# Patient Record
Sex: Male | Born: 1985
Health system: Southern US, Community
[De-identification: ages and names within clinical notes are randomized; demographics above are authoritative.]

## PROBLEM LIST (undated history)

## (undated) ENCOUNTER — Inpatient Hospital Stay: Payer: Self-pay | Admitting: Podiatry

## (undated) DIAGNOSIS — Z789 Other specified health status: Secondary | ICD-10-CM

## (undated) HISTORY — PX: NO PAST SURGERIES: SHX2092

---

## 2008-02-16 ENCOUNTER — Observation Stay (HOSPITAL_COMMUNITY): Admission: EM | Admit: 2008-02-16 | Discharge: 2008-02-17 | Payer: Self-pay | Admitting: Emergency Medicine

## 2008-02-16 IMAGING — CR DG NECK SOFT TISSUE
1 series · 1 of 1 positions shown · non-contrast
Comparison: None

CLINICAL DATA: Sore throat

NECK SOFT TISSUES - 3+ VIEW

[w soft tissue neck]
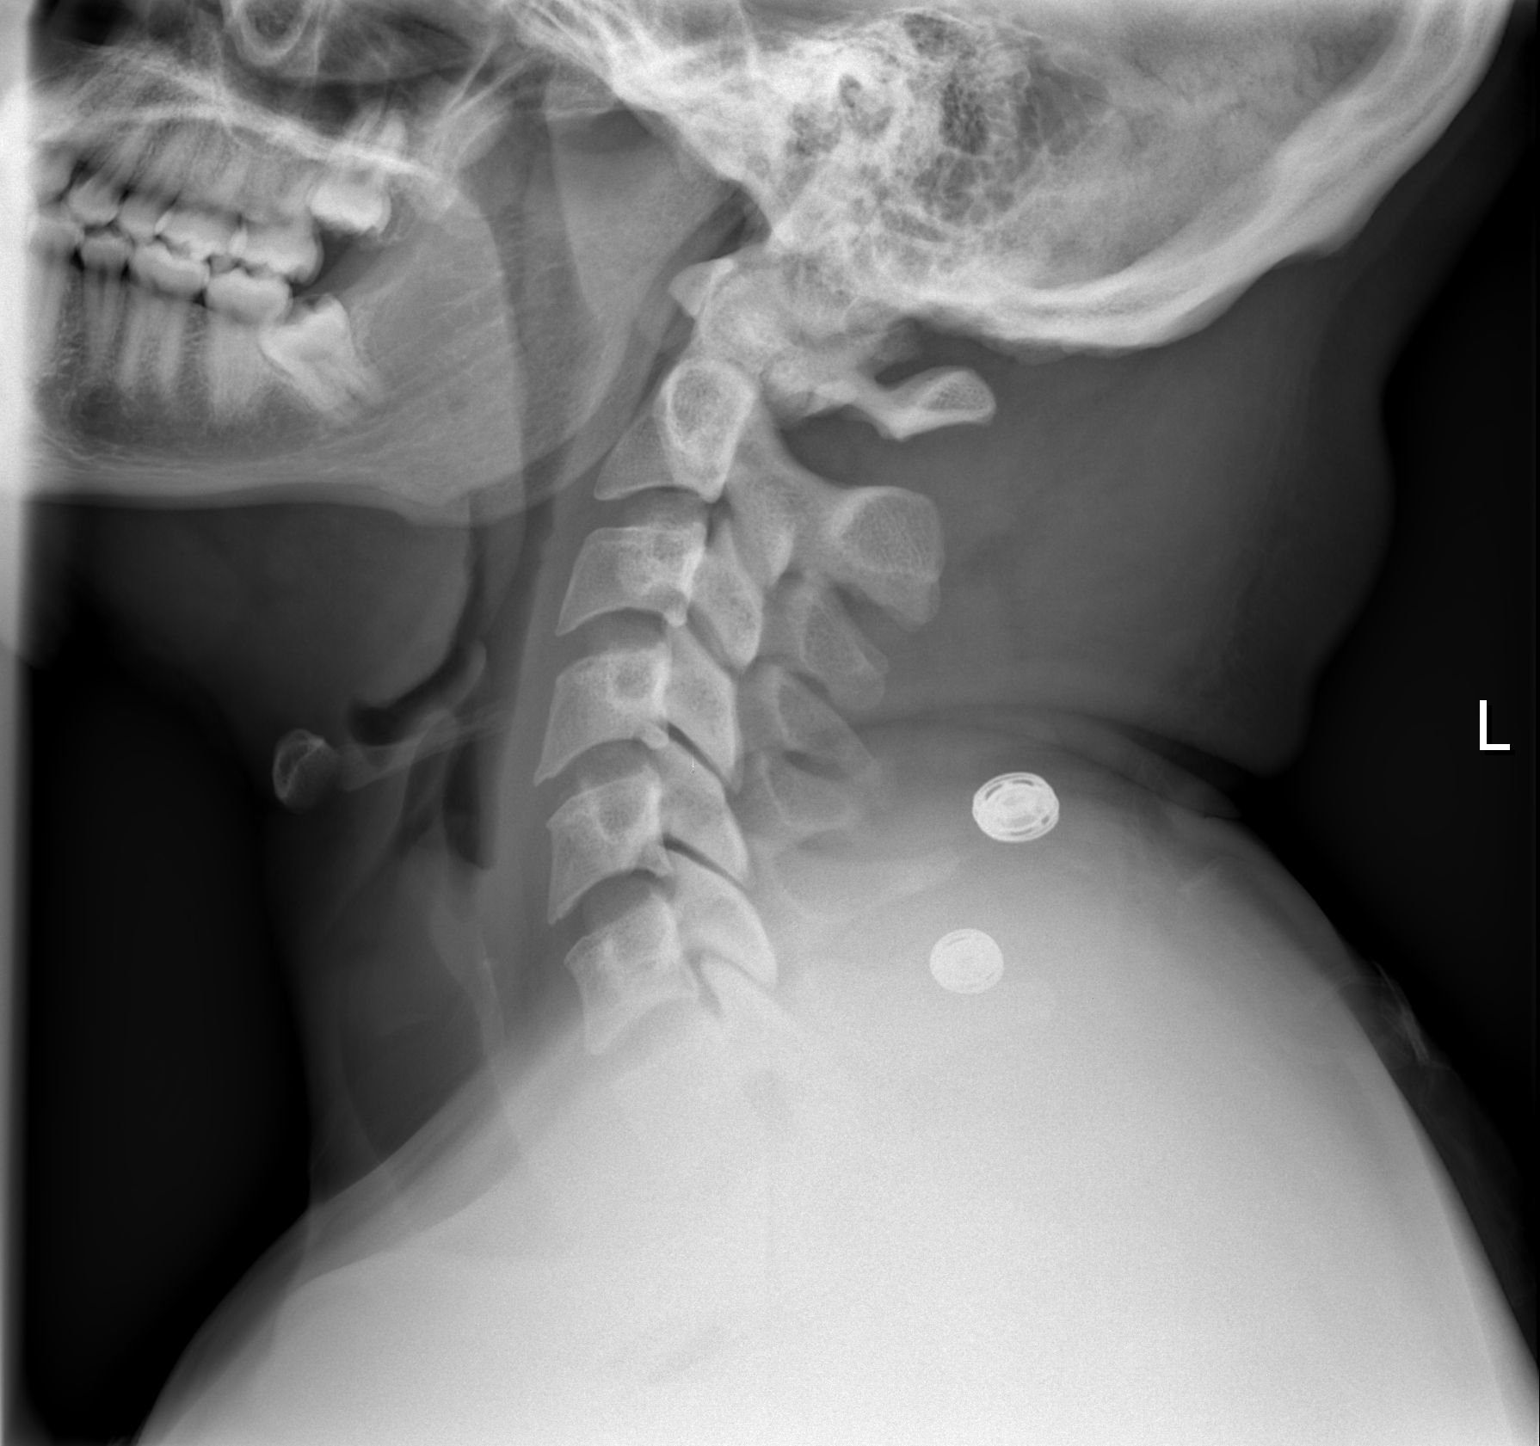

[1 of 1 positions shown; findings below may reference images not displayed]

FINDINGS: No radiopaque foreign body.  The airway is patent.
Epiglottis is normal.  No visualized osseous abnormality.
IMPRESSION: Normal soft tissue neck radiograph.

## 2010-09-06 NOTE — H&P (Signed)
NAMEPARV, Mark Stein               ACCOUNT NO.:  [id number]   MEDICAL RECORD NO.:  [id number]          PATIENT TYPE:  INP   LOCATION:  5528                         FACILITY:  [facility]   PHYSICIAN:  Vania Rea, M.D. DATE OF BIRTH:  1985-07-26   DATE OF ADMISSION:  02/16/2008  DATE OF DISCHARGE:                              HISTORY & PHYSICAL   PRIMARY CARE PHYSICIAN:  Unassigned.   CHIEF COMPLAINT:  Swelling of the throat, difficulty breathing.   HISTORY OF PRESENT ILLNESS:  This is a 25 year old obese African  American gentleman who has no past medical history, is on no medications  and has no medical followup who after a weekend of partying and drinking  went to sleep between around midnight and 1:00 a.m. this morning and  awoke suddenly at 4:00 this morning vomiting, felt his throat was  swollen inside and had difficulty breathing.  He came to the emergency  room where he received intravenous Benadryl and dexamethasone and said  his breathing is a little better now.  He has had no previous episode of  a similar problem, has no history of allergies, asthma nor sinusitis.  He does have a brother who had asthma in early childhood.  The patient  says the only unusual thing he has eaten or drank recently is that he  had some food at [facility] which is the first time he has eaten at [employer].   PAST MEDICAL HISTORY:  Nil.   MEDICATIONS:  None.   ALLERGIES:  No known drug allergies.   SOCIAL HISTORY:  Smokes one Black and Mild about every 2 weeks, drinks a  six pack of beer about every other weekend, smokes marijuana once about  every few months, denies cocaine use, works as a Conservation officer, nature at [employer].   FAMILY HISTORY:  Does not know of any family medical problems.   REVIEW OF SYSTEMS:  Other than noted above a 10 point review of systems  is completely normal.   PHYSICAL EXAMINATION:  GENERAL:  An obese young African American  gentleman lying on the stretcher in no acute distress.  VITAL SIGNS:  Temperature 97.1, pulse 97, respirations 20, blood  pressure 144/94.  He is saturating at 99% on room air.  He describes the  pain level as 8/10.  HEENT:  His pupils are round and equal.  Mucous membranes pink and  anicteric.  He is not dehydrated.  No cervical lymphadenopathy.  No  thyromegaly.  No jugular venous distention.  No oral lesions.  His uvula  is enlarged and edematous and he has some edema in the periuvula area.  He appears to be status post tonsillectomy.  CHEST:  Clear to auscultation bilaterally.  CARDIOVASCULAR:  Regular rhythm without murmurs.  ABDOMEN:  Obese, soft and nontender.  EXTREMITIES:  Without edema.  CENTRAL NERVOUS SYSTEM:  Cranial nerves II-XII are grossly intact and he  has no focal neurologic deficit.   LABS:  His CBC is unremarkable with a white count of 9.0 and a normal  differential.  His serum chemistry is normal.  His glucose is 102.  Chest x-ray  of his neck shows normal soft tissue radiograph.   ASSESSMENT:  Acute allergic reaction manifested as pharyngeal edema.   PLAN:  Will bring this gentleman in for observation for continued H1 and  H2 blockers, anti-inflammatory steroids and symptomatic treatment.  Can  be discharged home when the patient is considered no longer at risk for  respiratory compromise.  We understand ENT surgeons have been consulted  and they will be seeing the patient in hospital.  Other plans as per  orders.      Vania Rea, M.D.  Electronically Signed     LC/MEDQ  D:  02/16/2008  T:  02/16/2008  Job:  [id number]

## 2011-01-24 LAB — BASIC METABOLIC PANEL
BUN: 14
CO2: 26
Chloride: 103
Creatinine, Ser: 0.92
GFR calc non Af Amer: >60
Potassium: 4.6
Sodium: 138

## 2011-01-24 LAB — CBC
HCT: 42.8
Hemoglobin: 14.6
MCHC: 33.1
MCV: 86.8
Platelets: 280
RDW: 13.2
WBC: 20.1 — ABNORMAL HIGH

## 2011-01-24 LAB — POCT I-STAT, CHEM 8
BUN: 17
Calcium, Ion: 1.06 — ABNORMAL LOW
Chloride: 106
Potassium: 3.8

## 2011-01-24 LAB — DIFFERENTIAL
Basophils Relative: 1
Eosinophils Relative: 3
Lymphocytes Relative: 26
Lymphs Abs: 2.4
Monocytes Relative: 6
Neutro Abs: 5.8
Neutrophils Relative %: 64

## 2011-01-24 LAB — RAPID STREP SCREEN (MED CTR MEBANE ONLY): Streptococcus, Group A Screen (Direct): NEGATIVE

## 2015-12-15 ENCOUNTER — Ambulatory Visit (INDEPENDENT_AMBULATORY_CARE_PROVIDER_SITE_OTHER): Payer: Self-pay | Admitting: Physician Assistant

## 2015-12-15 VITALS — BP 158/90 | HR 92 | Temp 98.2°F | Resp 16 | Ht 72.0 in | Wt 253.0 lb

## 2015-12-15 DIAGNOSIS — L97521 Non-pressure chronic ulcer of other part of left foot limited to breakdown of skin: Secondary | ICD-10-CM

## 2015-12-15 LAB — POCT GLYCOSYLATED HEMOGLOBIN (HGB A1C): Hemoglobin A1C: 4.8

## 2015-12-15 LAB — GLUCOSE, POCT (MANUAL RESULT ENTRY): POC Glucose: 106 mg/dl — AB (ref 70–99)

## 2015-12-15 MED ORDER — CEPHALEXIN 500 MG PO CAPS: 500.0000 mg | ORAL_CAPSULE | Freq: Three times a day (TID) | ORAL | 0 refills | Status: DC

## 2015-12-15 NOTE — Patient Instructions (Addendum)
Wash you toe every 12 hours with soap and water, apply neosporin and reapply bandage.  If you are not improving it is okay to start taking antibiotic, however I don't think you need this today.     IF you received an x-ray today, you will receive an invoice from [facility]. Please contact [facility] at [phone] with questions or concerns regarding your invoice.   IF you received labwork today, you will receive an invoice from [facility]. Please contact [facility] at [phone] with questions or concerns regarding your invoice.   Our billing staff will not be able to assist you with questions regarding bills from these companies.  You will be contacted with the lab results as soon as they are available. The fastest way to get your results is to activate your My Chart account. Instructions are located on the last page of this paperwork. If you have not heard from us regarding the results in 2 weeks, please contact this office.

## 2015-12-15 NOTE — Progress Notes (Addendum)
   12/15/2015 10:40 AM   DOB: January 07, 1986 / MRN: [id number]  SUBJECTIVE:  Mark Stein is a 30 y.o. male presenting for a left great toe wound that started 3 days ago after walking barefoot.  He reports to me that the wound is painless.  He does not know if he is a diabetic as he is not receiving screenings.  He denies sock and glove paresthesia.  He urinates one to two times nightly and this has not changed.  Denies vision changes.    He has no allergies on file.   He  has no past medical history on file.    He  reports that he has quit smoking. He has never used smokeless tobacco. He  has no sexual activity history on file. The patient  has no past surgical history on file.  His family history is not on file.  Review of Systems  Constitutional: Negative for chills and fever.  Cardiovascular: Negative for chest pain.  Gastrointestinal: Negative for nausea.  Musculoskeletal: Negative for myalgias.  Skin: Positive for rash.  Neurological: Negative for dizziness and headaches.    The problem list and medications were reviewed and updated by myself where necessary and exist elsewhere in the encounter.   OBJECTIVE:  BP (!) 158/90 (BP Location: Right Arm, Patient Position: Sitting, Cuff Size: Large)   Pulse 92   Temp 98.2 F (36.8 C) (Oral)   Resp 16   Ht 6' (1.829 m)   Wt 253 lb (114.8 kg)   SpO2 99%   BMI 34.31 kg/m   Physical Exam  Cardiovascular: Normal rate and regular rhythm.   BP 138/84 on recheck by me  Pulmonary/Chest: Effort normal and breath sounds normal.  Musculoskeletal:       Feet:  Skin: Skin is warm and dry.    Results for orders placed or performed in visit on 12/15/15 (from the past 72 hour(s))  POCT glucose (manual entry)     Status: Abnormal   Collection Time: 12/15/15 10:22 AM  Result Value Ref Range   POC Glucose 106 (A) 70 - 99 mg/dl  POCT glycosylated hemoglobin (Hb A1C)     Status: None   Collection Time: 12/15/15 10:26 AM  Result Value Ref  Range   Hemoglobin A1C 4.8     No results found.  ASSESSMENT AND PLAN  Mark Stein was seen today for foot injury.  Diagnoses and all orders for this visit:  Toe ulcer, left, limited to breakdown of skin [facility](HCC): He is not diabetic.  Will try to treat this wound topically for now.  He has a printed prescription for keflex tid if he is not improving.   -     POCT glycosylated hemoglobin (Hb A1C) -     POCT glucose (manual entry)    The patient is advised to call or return to clinic if he does not see an improvement in symptoms, or to seek the care of the closest emergency department if he worsens with the above plan.   Deliah BostonMichael Clark, MHS, PA-C [facility] 12/15/2015 10:40 AM

## 2017-04-20 ENCOUNTER — Emergency Department (HOSPITAL_COMMUNITY): Payer: Managed Care, Other (non HMO)

## 2017-04-20 ENCOUNTER — Encounter (HOSPITAL_COMMUNITY): Payer: Self-pay | Admitting: Emergency Medicine

## 2017-04-20 ENCOUNTER — Emergency Department (HOSPITAL_COMMUNITY)
Admission: EM | Admit: 2017-04-20 | Discharge: 2017-04-20 | Disposition: A | Discharge status: Home / Self Care | Payer: Managed Care, Other (non HMO) | Attending: Emergency Medicine | Admitting: Emergency Medicine

## 2017-04-20 DIAGNOSIS — Z87891 Personal history of nicotine dependence: Secondary | ICD-10-CM | POA: Insufficient documentation

## 2017-04-20 DIAGNOSIS — M25562 Pain in left knee: Secondary | ICD-10-CM

## 2017-04-20 NOTE — Discharge Instructions (Signed)
1. Medications:Alternate 600 mg of ibuprofen and [phone] mg of Tylenol every 3 hours as needed for pain. Do not exceed 4000 mg of Tylenol daily, usual home medications 2. Treatment: rest, ice, elevate and use brace, drink plenty of fluids, gentle stretching 3. Follow Up: Please followup with orthopedics as directed or your PCP in 1 week if no improvement for discussion of your diagnoses and further evaluation after today's visit; if you do not have a primary care doctor use the resource guide provided to find one; Please return to the ER for worsening symptoms or other concerns

## 2017-04-20 NOTE — ED Provider Notes (Signed)
[facility]-EMERGENCY DEPT Provider Note   CSN: [id number] Arrival date & time: 04/20/17  0807     History   Chief Complaint Chief Complaint  Patient presents with  . Knee Pain    L knee    HPI Mark Stein is a 31 y.o. male who presents today for evaluation of acute onset, constant left knee pain for 3 days.  He states that on Christmas day he tripped over some toys and wrapping paper and twisted his left knee.  He denies head injury or loss of consciousness.  He endorses pain which he describes as a tightness to the medial aspect of the left knee which does not radiate.  Pain worsens with ambulation and bending, improved somewhat with elevation and rest.  He has tried Aleve without significant relief of his symptoms.  Denies numbness, tingling, or weakness.  The history is provided by the patient.    History reviewed. No pertinent past medical history.  There are no active problems to display for this patient.   History reviewed. No pertinent surgical history.     Home Medications    Prior to Admission medications   Medication Sig Start Date End Date Taking? Authorizing Provider  cephALEXin (KEFLEX) 500 MG capsule Take 1 capsule (500 mg total) by mouth 3 (three) times daily. Fill only if your toe is not improving. 12/15/15   Ofilia Neaslark, Michael L, PA-C    Family History History reviewed. No pertinent family history.  Social History Social History   Tobacco Use  . Smoking status: Former Games developermoker  . Smokeless tobacco: Never Used  Substance Use Topics  . Alcohol use: Yes  . Drug use: Yes    Frequency: 2.0 times per week    Types: Marijuana     Allergies   Patient has no known allergies.   Review of Systems Review of Systems  Musculoskeletal: Positive for arthralgias (Left knee).  Neurological: Negative for syncope, weakness, numbness and headaches.     Physical Exam Updated Vital Signs BP (!) 141/76   Pulse 80   Temp 98 F (36.7 C)  (Oral)   Resp 16   SpO2 98%   Physical Exam  Constitutional: He appears well-developed and well-nourished. No distress.  HENT:  Head: Normocephalic and atraumatic.  Eyes: Conjunctivae are normal. Right eye exhibits no discharge. Left eye exhibits no discharge.  Neck: No JVD present. No tracheal deviation present.  Cardiovascular: Normal rate and intact distal pulses.  2+ DP/PT pulses bilaterally, no lower extremity edema, no calf pain  Pulmonary/Chest: Effort normal.  Abdominal: He exhibits no distension.  Musculoskeletal: Normal range of motion. He exhibits no edema.       Left knee: He exhibits normal range of motion, no swelling, no effusion, no ecchymosis, no deformity, no laceration, no erythema, normal alignment, no LCL laxity, normal patellar mobility, no bony tenderness, normal meniscus and no MCL laxity. Tenderness found. MCL tenderness noted. No medial joint line, no lateral joint line, no LCL and no patellar tendon tenderness noted.  Normal range of motion of the left knee, with pain elicited on flexion and extension.  He is able to extend the leg with gravity.5/5 strength of BLE major muscle groups.  No significant swelling, deformity, or crepitus noted.  Negative anterior/posterior drawer test.  No varus valgus instability.  Focally tender overlying the MCL of the left knee.  Neurological: He is alert. No sensory deficit.  Fluent speech, no facial droop, sensation intact to soft touch of the  bilateral lower extremities.  Antalgic gait, but able to Heel Walk and Toe Walk without difficulty.  Skin: Skin is warm and dry. No erythema.  Psychiatric: He has a normal mood and affect. His behavior is normal.  Nursing note and vitals reviewed.    ED Treatments / Results  Labs (all labs ordered are listed, but only abnormal results are displayed) Labs Reviewed - No data to display  EKG  EKG Interpretation None       Radiology Dg Knee Complete 4 Views Left  Result Date:  04/20/2017 CLINICAL DATA:  Injury.  Fall. EXAM: LEFT KNEE - COMPLETE 4+ VIEW COMPARISON:  No recent. FINDINGS: No acute bony or joint abnormality identified. No evidence of fracture dislocation. IMPRESSION: No acute abnormality. Electronically Signed   By: Maisie Fushomas  Register   On: 04/20/2017 09:22    Procedures Procedures (including critical care time)  Medications Ordered in ED Medications - No data to display   Initial Impression / Assessment and Plan / ED Course  I have reviewed the triage vital signs and the nursing notes.  Pertinent labs & imaging results that were available during my care of the patient were reviewed by me and considered in my medical decision making (see chart for details).     Patient with left knee pain secondary to injury 3 days ago.  Afebrile, vital signs are stable.  He is neurovascularly intact.Ambulatory without difficulty although it is painful.  Radiographs reviewed by me show no fracture or dislocation.  Suspect MCL injury/inflammation as he is focally tender in this area only.  Will discharge with knee sleeve, crutches, and he will follow-up with orthopedics for reevaluation of his symptoms.  Discussed indications for return to the ED. Pt verbalized understanding of and agreement with plan and is safe for discharge home at this time.  No complaints prior to discharge.  Final Clinical Impressions(s) / ED Diagnoses   Final diagnoses:  Acute pain of left knee    ED Discharge Orders    None       Bennye AlmFawze, Brogan England A, PA-C 04/20/17 1021    Doug SouJacubowitz, Sam, MD 04/20/17 1646

## 2017-04-20 NOTE — ED Triage Notes (Signed)
Pt c/o L knee pain, patient states he twisted knee three days ago and has tried Aleve, no relief with meds.

## 2017-05-27 IMAGING — CR DG KNEE COMPLETE 4+V*L*
4 series · 4 of 4 positions shown · non-contrast
Comparison: No recent.

CLINICAL DATA: Injury.  Fall.

EXAM:
LEFT KNEE - COMPLETE 4+ VIEW

[t knee ap left]
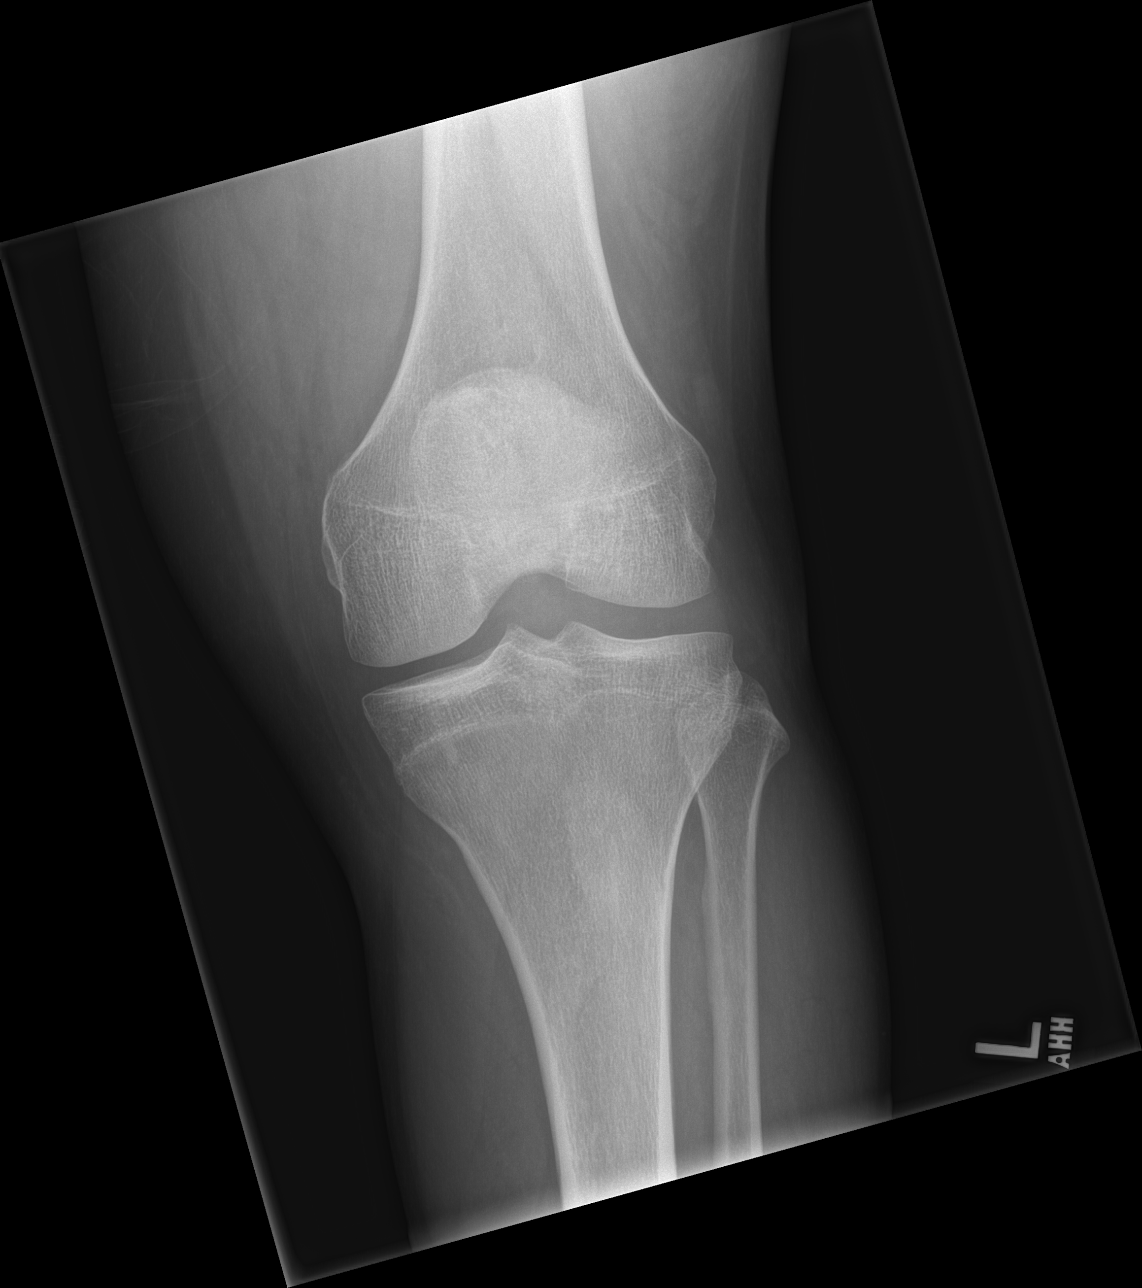

[t knee obl left (1 of 2)]
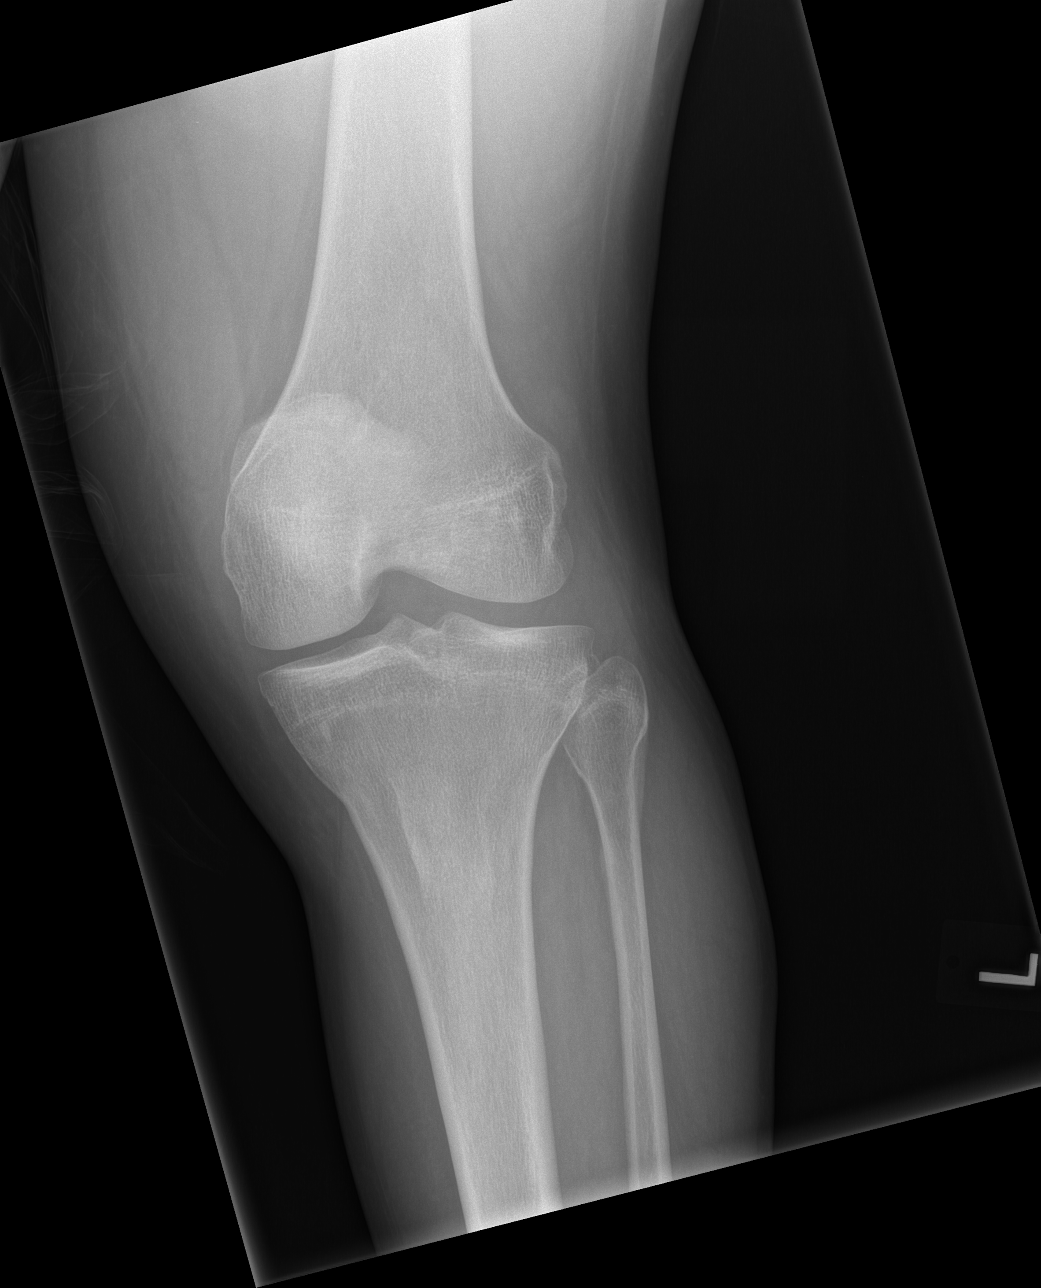

[t knee obl left (2 of 2)]
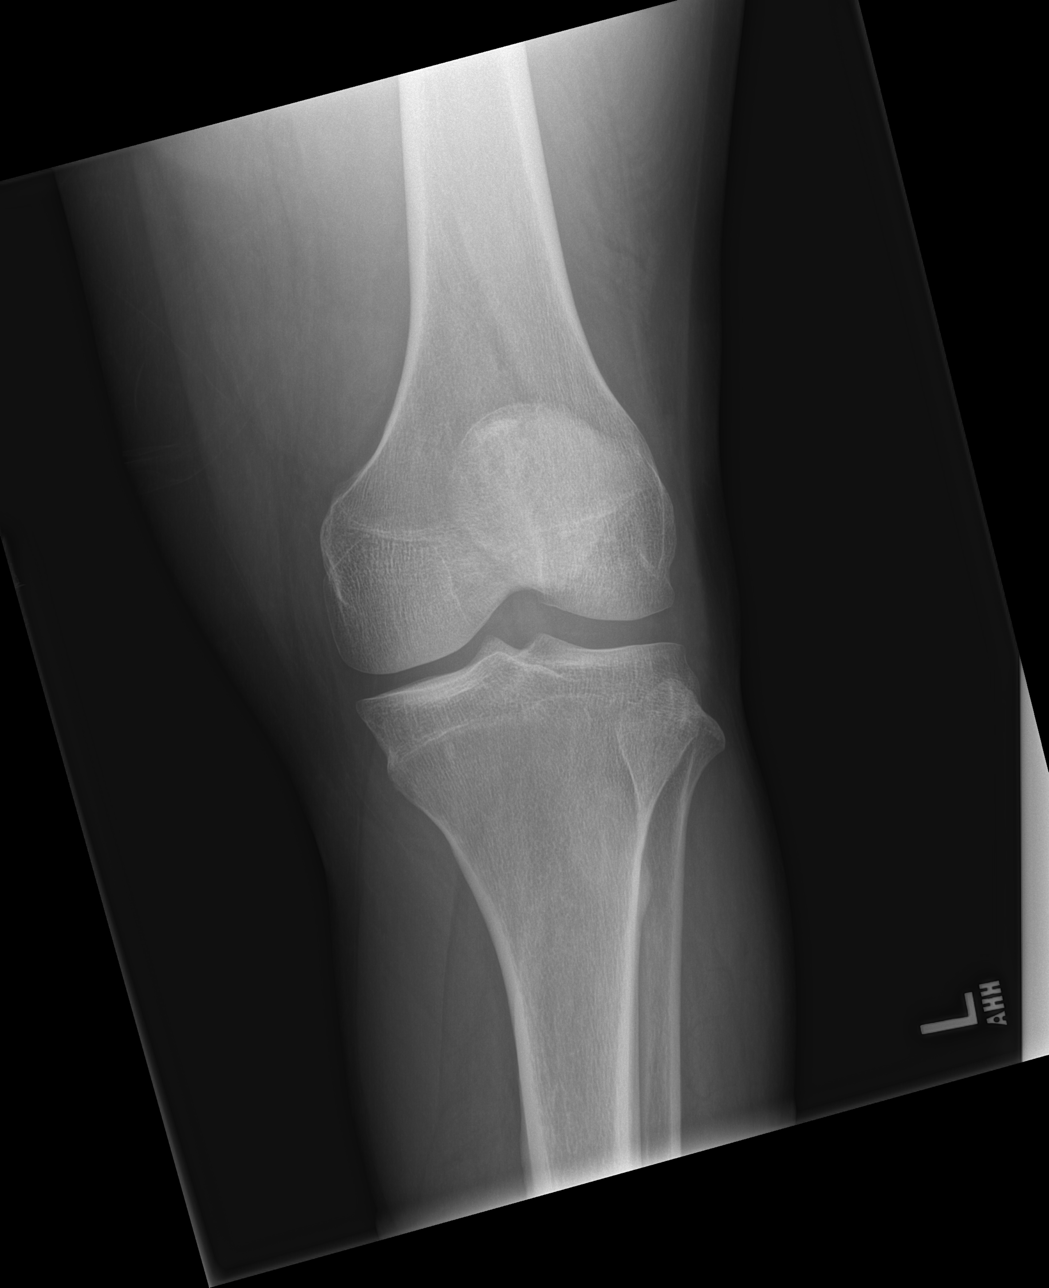

[t knee lat left]
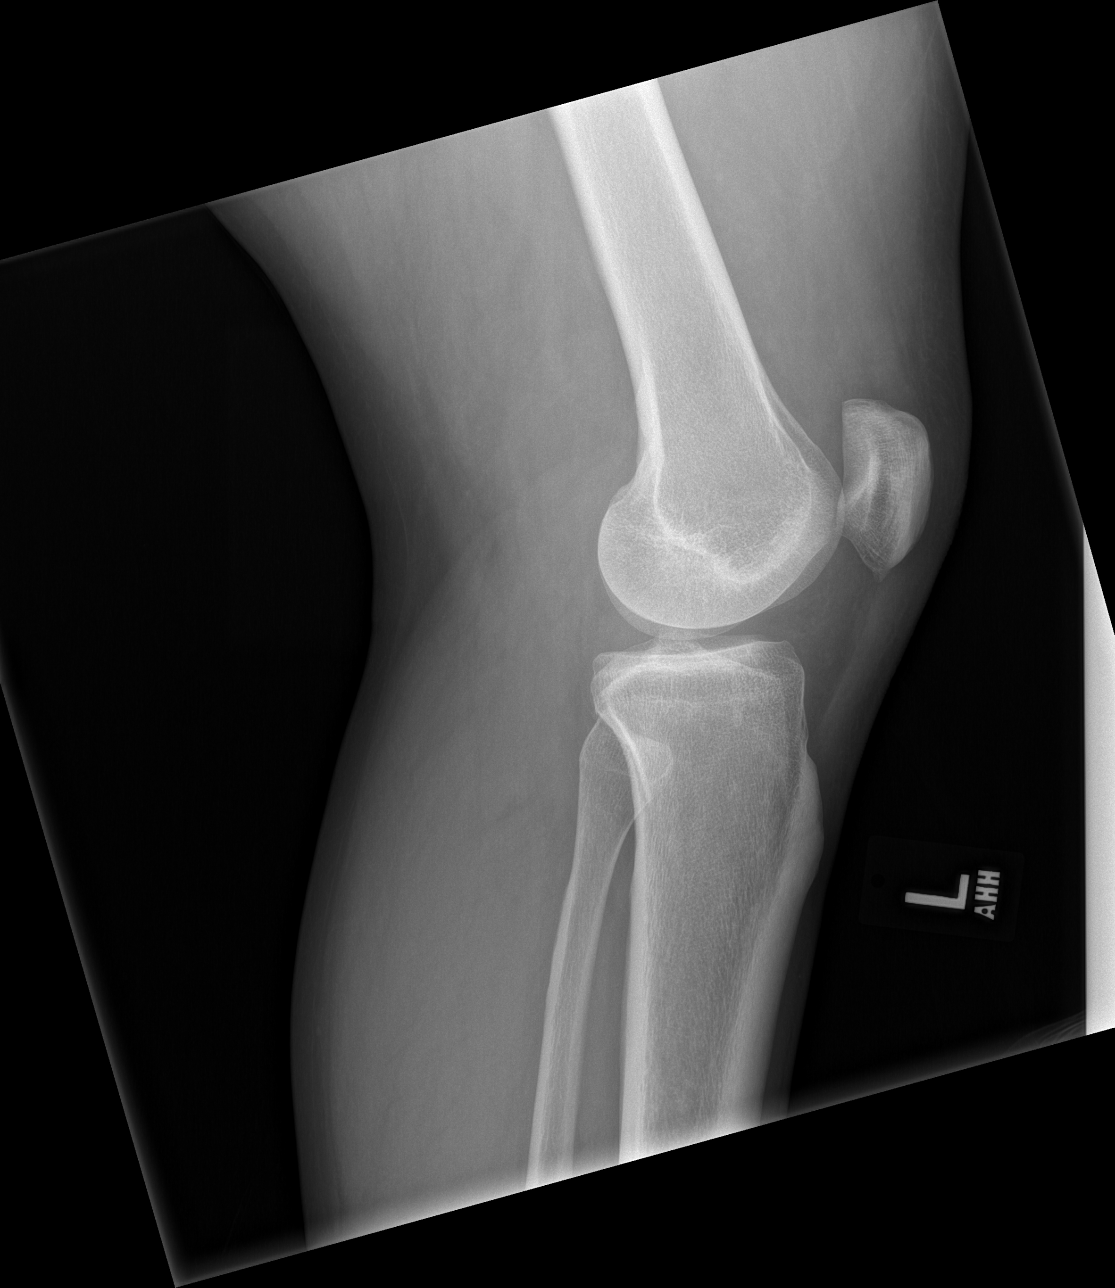

[4 of 4 positions shown; findings below may reference images not displayed]

FINDINGS: No acute bony or joint abnormality identified. No evidence of
fracture dislocation.
IMPRESSION: No acute abnormality.

## 2017-10-12 ENCOUNTER — Ambulatory Visit (INDEPENDENT_AMBULATORY_CARE_PROVIDER_SITE_OTHER): Payer: Managed Care, Other (non HMO)

## 2017-10-12 ENCOUNTER — Ambulatory Visit (INDEPENDENT_AMBULATORY_CARE_PROVIDER_SITE_OTHER): Payer: Managed Care, Other (non HMO) | Admitting: Podiatry

## 2017-10-12 ENCOUNTER — Ambulatory Visit: Payer: Self-pay

## 2017-10-12 DIAGNOSIS — L97522 Non-pressure chronic ulcer of other part of left foot with fat layer exposed: Secondary | ICD-10-CM

## 2017-10-12 DIAGNOSIS — L97519 Non-pressure chronic ulcer of other part of right foot with unspecified severity: Secondary | ICD-10-CM

## 2017-10-12 NOTE — Progress Notes (Signed)
  Subjective:  Patient ID: Mark NewcomerJulius Stein, male    DOB: Aug 29, 1985,  MRN: [id number]  Chief Complaint  Patient presents with  . Foot Ulcer    left great toe since january    32 y.o. male presents with the above complaint.  She reports open ulceration to the bottom of the left great toe x6 months.  Has been using Neosporin and a Band-Aid but states that it was not doing much.  Denies diabetes but states he has been checked for several years. No past medical history on file. No past surgical history on file.  Current Outpatient Medications:  .  cephALEXin (KEFLEX) 500 MG capsule, Take 1 capsule (500 mg total) by mouth 3 (three) times daily. Fill only if your toe is not improving., Disp: 30 capsule, Rfl: 0  No Known Allergies Review of Systems: Negative except as noted in the HPI. Denies N/V/F/Ch. Objective:  There were no vitals filed for this visit. General AA&O x3. Normal mood and affect.  Vascular Dorsalis pedis and posterior tibial pulses  present 2+ bilaterally  Capillary refill normal to all digits. Pedal hair growth normal.  Neurologic Epicritic sensation grossly present.  Dermatologic No open lesions. Interspaces clear of maceration. Nails well groomed and normal in appearance. Right hallux ulcer measuring 2 x 1.5 pre-debridement 2 x 2 post debridement.  Down to granular base.  Malodor noted.  Undermining noted.  Orthopedic: MMT 5/5 in dorsiflexion, plantarflexion, inversion, and eversion. Normal joint ROM without pain or crepitus.   Assessment & Plan:  Patient was evaluated and treated and all questions answered.  Ulcer left hallux -Debrided as below -Dressed with Prisma Ag -X-rays taken reviewed no underlying osseous abnormalities -Surgical shoe dispensed -Commended patient follow-up with PCP for recheck to make sure he is not diabetic  Procedure: Excisional Debridement of Wound Rationale: Removal of non-viable soft tissue from the wound to promote healing.    Anesthesia: none Pre-Debridement Wound Measurements: 2 cm x 1.5 cm x 0.3 cm  Post-Debridement Wound Measurements: 2 cm x 2 cm x 0.3 cm  Type of Debridement: Excisional Tissue Removed: Non-viable soft tissue Depth of Debridement: subq Instrumentation: 3-0 mm dermal curette Technique: Sharp excisional debridement to bleeding, viable wound base.  Dressing: Dry, sterile, compression dressing. Disposition: Patient tolerated procedure well. Patient to return in 1 week for follow-up.    Return in about 2 weeks (around 10/26/2017) for Wound Care.

## 2017-10-29 ENCOUNTER — Ambulatory Visit (INDEPENDENT_AMBULATORY_CARE_PROVIDER_SITE_OTHER): Payer: Managed Care, Other (non HMO) | Admitting: Podiatry

## 2017-10-29 ENCOUNTER — Encounter: Payer: Self-pay | Admitting: Podiatry

## 2017-10-29 VITALS — BP 144/91 | HR 94 | Temp 98.9°F | Resp 16

## 2017-10-29 DIAGNOSIS — L97522 Non-pressure chronic ulcer of other part of left foot with fat layer exposed: Secondary | ICD-10-CM

## 2017-11-09 ENCOUNTER — Ambulatory Visit: Payer: Managed Care, Other (non HMO) | Admitting: Podiatry

## 2017-11-09 ENCOUNTER — Encounter: Payer: Self-pay | Admitting: Podiatry

## 2017-11-09 ENCOUNTER — Ambulatory Visit (INDEPENDENT_AMBULATORY_CARE_PROVIDER_SITE_OTHER): Payer: Managed Care, Other (non HMO) | Admitting: Podiatry

## 2017-11-09 DIAGNOSIS — L97522 Non-pressure chronic ulcer of other part of left foot with fat layer exposed: Secondary | ICD-10-CM | POA: Diagnosis not present

## 2017-11-09 NOTE — Progress Notes (Signed)
  Subjective:  Patient ID: Mark NewcomerJulius Stein, male    DOB: [birth year],  MRN: [id number]  Chief Complaint  Patient presents with  . Foot Ulcer    Follow up ulcer plantar hallux left   "Its okay I guess, can't see it really"    32 y.o. male presents with the above complaint. States the wound is ok.  Objective:   There were no vitals filed for this visit. General AA&O x3. Normal mood and affect.  Vascular Dorsalis pedis and posterior tibial pulses  present 2+ bilaterally  Capillary refill normal to all digits. Pedal hair growth normal.  Neurologic Epicritic sensation grossly present.  Dermatologic No open lesions. Interspaces clear of maceration. Nails well groomed and normal in appearance. Right hallux ulcer measuring 2x1.5 pre-debridement 2x2 post.  Down to granular base.  Malodor noted.  Undermining noted.  Orthopedic: MMT 5/5 in dorsiflexion, plantarflexion, inversion, and eversion. Normal joint ROM without pain or crepitus.   Assessment & Plan:  Patient was evaluated and treated and all questions answered.  Ulcer left hallux -Debrided as below -Dressed with medihoney -Switch to walker boot. Wound likely due to gait abnormality. Will f/u with orthotist should wound not progress.  Procedure: Excisional Debridement of Wound Rationale: Removal of non-viable soft tissue from the wound to promote healing.  Anesthesia: none Pre-Debridement Wound Measurements: 2 cm x 1.5 cm x 0.3 cm  Post-Debridement Wound Measurements: 2 cm x 2 cm x 0.3 cm  Type of Debridement: Excisional Tissue Removed: Non-viable soft tissue Depth of Debridement: subq Instrumentation: tissue nipper Technique: Sharp excisional debridement to bleeding, viable wound base.  Dressing: Dry, sterile, compression dressing. Disposition: Patient tolerated procedure well. Patient to return in 1 week for follow-up.  Return in about 2 weeks (around 11/23/2017) for Foot Ulcer, left foot, Wound Care.

## 2017-11-09 NOTE — Progress Notes (Signed)
  Subjective:  Patient ID: Mark Stein, male    DOB: 1986-03-19,  MRN: [id number]  Chief Complaint  Patient presents with  . Ulcer    F/U L toe ulcer PT. stated," It looks about the same." Tx: medihoney, and shoe -pt denies N/v/Ch/F redness or drainage    32 y.o. male presents with the above complaint. States the wound looks about the same. Has been using medihoney.  Objective:   Vitals:   10/29/17 1047  BP: (!) 144/91  Pulse: 94  Resp: 16  Temp: 98.9 F (37.2 C)   General AA&O x3. Normal mood and affect.  Vascular Dorsalis pedis and posterior tibial pulses  present 2+ bilaterally  Capillary refill normal to all digits. Pedal hair growth normal.  Neurologic Epicritic sensation grossly present.  Dermatologic No open lesions. Interspaces clear of maceration. Nails well groomed and normal in appearance.  Right hallux ulcer measuring 2x1  pre-debridement 2x1.5 post.  Down to granular base.  Malodor noted.  Undermining noted.  Orthopedic: MMT 5/5 in dorsiflexion, plantarflexion, inversion, and eversion. Normal joint ROM without pain or crepitus.   Assessment & Plan:  Patient was evaluated and treated and all questions answered.  Ulcer left hallux -Debrided as below -Dressed with Prisma Ag -Continue surgical shoe.  Procedure: Excisional Debridement of Wound Rationale: Removal of non-viable soft tissue from the wound to promote healing.  Anesthesia: none Pre-Debridement Wound Measurements: 2 cm x 1 cm x 0.3 cm  Post-Debridement Wound Measurements: 2 cm x 1.5 cm x 0.3 cm  Type of Debridement: Excisional Tissue Removed: Non-viable soft tissue Depth of Debridement: subq Instrumentation: 3-0 mm dermal curette Technique: Sharp excisional debridement to bleeding, viable wound base.  Dressing: Dry, sterile, compression dressing. Disposition: Patient tolerated procedure well. Patient to return in 1 week for follow-up.  Return in about 2 weeks (around 11/12/2017) for Wound  Care.

## 2017-11-23 ENCOUNTER — Ambulatory Visit (INDEPENDENT_AMBULATORY_CARE_PROVIDER_SITE_OTHER): Payer: Managed Care, Other (non HMO) | Admitting: Podiatry

## 2017-11-23 DIAGNOSIS — M205X2 Other deformities of toe(s) (acquired), left foot: Secondary | ICD-10-CM

## 2017-11-23 DIAGNOSIS — L97522 Non-pressure chronic ulcer of other part of left foot with fat layer exposed: Secondary | ICD-10-CM

## 2017-11-23 DIAGNOSIS — M21612 Bunion of left foot: Secondary | ICD-10-CM | POA: Diagnosis not present

## 2017-11-23 DIAGNOSIS — M2012 Hallux valgus (acquired), left foot: Secondary | ICD-10-CM

## 2017-11-25 NOTE — Progress Notes (Signed)
  Subjective:  Patient ID: Mark Stein, male    DOB: April 01, 1986,  MRN: [id number]  Chief Complaint  Patient presents with  . Foot Ulcer      Ulcer Left Foot Wound Care 2 WK F/U    32 y.o. male presents with the above complaint.  States the wound is looking a little better states that the boot helps him walk.  Objective:   There were no vitals filed for this visit. General AA&O x3. Normal mood and affect.  Vascular Dorsalis pedis and posterior tibial pulses  present 2+ bilaterally  Capillary refill normal to all digits. Pedal hair growth normal.  Neurologic Epicritic sensation grossly present.  Dermatologic No open lesions. Interspaces clear of maceration. Nails well groomed and normal in appearance. Right hallux ulcer measuring 2x2 post debridement.  Down to granular base.  Malodor noted.  Undermining noted.  Orthopedic: MMT 5/5 in dorsiflexion, plantarflexion, inversion, and eversion. Normal joint ROM without pain or crepitus.  HAV deformity left, Hallux interphalangeus left hallux limitus   Assessment & Plan:  Patient was evaluated and treated and all questions answered.  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  Anesthesia: none Pre-Debridement Wound Measurements: 1.\5 cm x 1.5 cm x 0.5 cm  Post-Debridement Wound Measurements: 2 cm x 2 cm x 0.5 cm  Type of Debridement: Excisional Tissue Removed: Non-viable soft tissue Depth of Debridement: subq Instrumentation: 3-0 mm dermal curette Technique: Sharp excisional debridement to bleeding, viable wound base.  Dressing: Dry, sterile,  compression dressing. Disposition: Patient tolerated procedure well. Patient to return in 1 week for follow-up.    Return in about 2 weeks (around 12/07/2017) for Wound Care, Left.

## 2017-12-06 ENCOUNTER — Ambulatory Visit: Payer: Managed Care, Other (non HMO) | Admitting: Orthotics

## 2017-12-06 DIAGNOSIS — M21612 Bunion of left foot: Principal | ICD-10-CM

## 2017-12-06 DIAGNOSIS — M2012 Hallux valgus (acquired), left foot: Secondary | ICD-10-CM

## 2017-12-06 DIAGNOSIS — L97522 Non-pressure chronic ulcer of other part of left foot with fat layer exposed: Secondary | ICD-10-CM

## 2017-12-06 NOTE — Progress Notes (Signed)
Going to verify insurance before we proceed.

## 2017-12-07 ENCOUNTER — Ambulatory Visit (INDEPENDENT_AMBULATORY_CARE_PROVIDER_SITE_OTHER): Payer: Managed Care, Other (non HMO) | Admitting: Podiatry

## 2017-12-07 DIAGNOSIS — M205X2 Other deformities of toe(s) (acquired), left foot: Secondary | ICD-10-CM | POA: Diagnosis not present

## 2017-12-07 DIAGNOSIS — L97522 Non-pressure chronic ulcer of other part of left foot with fat layer exposed: Secondary | ICD-10-CM | POA: Diagnosis not present

## 2017-12-07 DIAGNOSIS — M21612 Bunion of left foot: Secondary | ICD-10-CM

## 2017-12-07 DIAGNOSIS — M2012 Hallux valgus (acquired), left foot: Secondary | ICD-10-CM | POA: Diagnosis not present

## 2017-12-07 NOTE — Progress Notes (Signed)
  Subjective:  Patient ID: Mark NewcomerJulius Stein, male    DOB: [birth year],  MRN: [id number]  Chief Complaint  Patient presents with  . Foot Ulcer    2 week wound care left    32 y.o. male presents for wound care.  States the wound is doing about the same.  Review of Systems: Negative except as noted in the HPI. Denies N/V/F/Ch.  No past medical history on file. No current outpatient medications on file.  Social History   Tobacco Use  Smoking Status Former Smoker  Smokeless Tobacco Never Used    No Known Allergies Objective:  There were no vitals filed for this visit. There is no height or weight on file to calculate BMI. Constitutional Well developed. Well nourished.  Vascular Dorsalis pedis pulses palpable bilaterally. Posterior tibial pulses palpable bilaterally. Capillary refill normal to all digits.  No cyanosis or clubbing noted. Pedal hair growth normal.  Neurologic Normal speech. Oriented to person, place, and time. Protective sensation absent  Dermatologic Wound Location: Left hallux Wound Base: Granular/Healthy Peri-wound: Macerated Exudate: Scant/small amount Serosanguinous exudate Wound Measurements: -8/16: 2cm x 2cm  Orthopedic: No pain to palpation either foot.   Radiographs: None today Assessment:   1. Skin ulcer of left foot with fat layer exposed (HCC)    Plan:  Patient was evaluated and treated and all questions answered.  Ulcer left hallux in the setting of HAV and hallux limitus -Debridement as below. -Dressed with medihoney, DSD. -Continue off-loading with CAM boot -Discussed possibly performing hallux IPJ arthroplasty and attempts to prevent the underlying biomechanical deforming force.  Would benefit from possible wound closure with rotation flap versus sinus tarsi pinch graft.  All response alternatives been to patient.  Discussed importance of pursuing other options but has failed to progress significantly.  Consent reviewed and signed by  patient.  Procedure: Excisional Debridement of Wound Rationale: Removal of non-viable soft tissue from the wound to promote healing.  Anesthesia: none Pre-Debridement Wound Measurements: 2 cm x 1 cm x 0.3 cm  Post-Debridement Wound Measurements: 2 cm x 2 cm x 0.3 cm  Type of Debridement: Sharp Excisional Tissue Removed: Non-viable soft tissue Depth of Debridement: subcutaneous tissue. Technique: Sharp excisional debridement to bleeding, viable wound base.  Dressing: Dry, sterile, compression dressing. Disposition: Patient tolerated procedure well. Patient to return in 1 week for follow-up.  Return for post op.

## 2017-12-07 NOTE — Patient Instructions (Signed)
Pre-Operative Instructions  Congratulations, you have decided to take an important step towards improving your quality of life.  You can be assured that the doctors and staff at [facility] will be with you every step of the way.  Here are some important things you should know:  1. Plan to be at the surgery center/hospital at least 1 (one) hour prior to your scheduled time, unless otherwise directed by the surgical center/hospital staff.  You must have a responsible adult accompany you, remain during the surgery and drive you home.  Make sure you have directions to the surgical center/hospital to ensure you arrive on time. 2. If you are having surgery at [facility] or [facility] hospitals, you will need a copy of your medical history and physical form from your family physician within one month prior to the date of surgery. We will give you a form for your primary physician to complete.  3. We make every effort to accommodate the date you request for surgery.  However, there are times where surgery dates or times have to be moved.  We will contact you as soon as possible if a change in schedule is required.   4. No aspirin/ibuprofen for one week before surgery.  If you are on aspirin, any non-steroidal anti-inflammatory medications (Mobic, Aleve, Ibuprofen) should not be taken seven (7) days prior to your surgery.  You make take Tylenol for pain prior to surgery.  5. Medications - If you are taking daily heart and blood pressure medications, seizure, reflux, allergy, asthma, anxiety, pain or diabetes medications, make sure you notify the surgery center/hospital before the day of surgery so they can tell you which medications you should take or avoid the day of surgery. 6. No food or drink after midnight the night before surgery unless directed otherwise by surgical center/hospital staff. 7. No alcoholic beverages 24-hours prior to surgery.  No smoking 24-hours prior or 24-hours after  surgery. 8. Wear loose pants or shorts. They should be loose enough to fit over bandages, boots, and casts. 9. Don't wear slip-on shoes. Sneakers are preferred. 10. Bring your boot with you to the surgery center/hospital.  Also bring crutches or a walker if your physician has prescribed it for you.  If you do not have this equipment, it will be provided for you after surgery. 11. If you have not been contacted by the surgery center/hospital by the day before your surgery, call to confirm the date and time of your surgery. 12. Leave-time from work may vary depending on the type of surgery you have.  Appropriate arrangements should be made prior to surgery with your employer. 13. Prescriptions will be provided immediately following surgery by your doctor.  Fill these as soon as possible after surgery and take the medication as directed. Pain medications will not be refilled on weekends and must be approved by the doctor. 14. Remove nail polish on the operative foot and avoid getting pedicures prior to surgery. 15. Wash the night before surgery.  The night before surgery wash the foot and leg well with water and the antibacterial soap provided. Be sure to pay special attention to beneath the toenails and in between the toes.  Wash for at least three (3) minutes. Rinse thoroughly with water and dry well with a towel.  Perform this wash unless told not to do so by your physician.  Enclosed: 1 Ice pack (please put in freezer the night before surgery)   1 Hibiclens skin cleaner     Pre-op instructions  If you have any questions regarding the instructions, please do not hesitate to call our office.  [place]: [address], [place], [place] [address] -- [phone]  [place]: [address] [place], [place] [address] -- [phone]  [place]: [address]  [place], [place] [address] -- [phone]  [place]: [address], [place], [place] [address] -- [phone]  Website:  [email or web address] 

## 2017-12-11 ENCOUNTER — Telehealth: Payer: Self-pay | Admitting: *Deleted

## 2017-12-11 NOTE — Telephone Encounter (Signed)
"  I have a scheduled surgery with Dr. Samuella CotaPrice on September 4.  I need to cancel that appointment.  Give me a call if you have any questions.

## 2017-12-13 ENCOUNTER — Telehealth: Payer: Self-pay | Admitting: Podiatry

## 2017-12-13 NOTE — Telephone Encounter (Signed)
Left message for pt to call to discuss brace benefits.

## 2017-12-14 NOTE — Telephone Encounter (Signed)
I know you called to cancel your surgery.  Do you want to reschedule it or do you have a particular reason of why you want to cancel the surgery?  "I can't take the time off from work to heal at this time."  If you need any conservative treatment give us a call to schedule an appointment.  "Okay, I will."

## 2018-02-11 ENCOUNTER — Encounter (HOSPITAL_COMMUNITY): Payer: Self-pay | Admitting: Emergency Medicine

## 2018-02-11 ENCOUNTER — Other Ambulatory Visit: Payer: Self-pay

## 2018-02-11 ENCOUNTER — Emergency Department (HOSPITAL_COMMUNITY)
Admission: EM | Admit: 2018-02-11 | Discharge: 2018-02-11 | Disposition: A | Discharge status: Home / Self Care | Payer: Managed Care, Other (non HMO) | Attending: Emergency Medicine | Admitting: Emergency Medicine

## 2018-02-11 ENCOUNTER — Emergency Department (HOSPITAL_COMMUNITY): Payer: Managed Care, Other (non HMO)

## 2018-02-11 DIAGNOSIS — R0789 Other chest pain: Secondary | ICD-10-CM | POA: Diagnosis not present

## 2018-02-11 DIAGNOSIS — R0781 Pleurodynia: Secondary | ICD-10-CM

## 2018-02-11 DIAGNOSIS — Z87891 Personal history of nicotine dependence: Secondary | ICD-10-CM | POA: Diagnosis not present

## 2018-02-11 DIAGNOSIS — R51 Headache: Secondary | ICD-10-CM | POA: Insufficient documentation
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
## 2018-02-11 MED ORDER — METHOCARBAMOL 500 MG PO TABS: 500.0000 mg | ORAL_TABLET | Freq: Two times a day (BID) | ORAL | 0 refills | Status: DC
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
## 2018-02-11 NOTE — Discharge Instructions (Addendum)
Please read attached information. If you experience any new or worsening signs or symptoms please return to the emergency room for evaluation. Please follow-up with your primary care provider or specialist as discussed. Please use medication prescribed only as directed and discontinue taking if you have any concerning signs or symptoms.   °

## 2018-10-08 ENCOUNTER — Ambulatory Visit (INDEPENDENT_AMBULATORY_CARE_PROVIDER_SITE_OTHER): Payer: BC Managed Care – PPO

## 2018-10-08 ENCOUNTER — Other Ambulatory Visit: Payer: Self-pay

## 2018-10-08 ENCOUNTER — Ambulatory Visit (INDEPENDENT_AMBULATORY_CARE_PROVIDER_SITE_OTHER): Payer: BC Managed Care – PPO | Admitting: Podiatry

## 2018-10-08 DIAGNOSIS — L03031 Cellulitis of right toe: Secondary | ICD-10-CM | POA: Diagnosis not present

## 2018-10-08 DIAGNOSIS — M86171 Other acute osteomyelitis, right ankle and foot: Secondary | ICD-10-CM

## 2018-10-08 DIAGNOSIS — M79672 Pain in left foot: Secondary | ICD-10-CM

## 2018-10-08 DIAGNOSIS — L97511 Non-pressure chronic ulcer of other part of right foot limited to breakdown of skin: Secondary | ICD-10-CM

## 2018-10-08 DIAGNOSIS — M79671 Pain in right foot: Secondary | ICD-10-CM

## 2018-10-08 DIAGNOSIS — L02611 Cutaneous abscess of right foot: Secondary | ICD-10-CM | POA: Diagnosis not present

## 2018-10-08 MED ORDER — HYDROCODONE-ACETAMINOPHEN 5-325 MG PO TABS: 1.0000 | ORAL_TABLET | Freq: Four times a day (QID) | ORAL | 0 refills | Status: DC | PRN

## 2018-10-08 MED ORDER — CLINDAMYCIN HCL 300 MG PO CAPS: 300.0000 mg | ORAL_CAPSULE | Freq: Two times a day (BID) | ORAL | 0 refills | Status: DC

## 2018-10-08 NOTE — Patient Instructions (Signed)
Pre-Operative Instructions  Congratulations, you have decided to take an important step towards improving your quality of life.  You can be assured that the doctors and staff at [facility] will be with you every step of the way.  Here are some important things you should know:  1. Plan to be at the surgery center/hospital at least 1 (one) hour prior to your scheduled time, unless otherwise directed by the surgical center/hospital staff.  You must have a responsible adult accompany you, remain during the surgery and drive you home.  Make sure you have directions to the surgical center/hospital to ensure you arrive on time. 2. If you are having surgery at [facility] or [facility] hospitals, you will need a copy of your medical history and physical form from your family physician within one month prior to the date of surgery. We will give you a form for your primary physician to complete.  3. We make every effort to accommodate the date you request for surgery.  However, there are times where surgery dates or times have to be moved.  We will contact you as soon as possible if a change in schedule is required.   4. No aspirin/ibuprofen for one week before surgery.  If you are on aspirin, any non-steroidal anti-inflammatory medications (Mobic, Aleve, Ibuprofen) should not be taken seven (7) days prior to your surgery.  You make take Tylenol for pain prior to surgery.  5. Medications - If you are taking daily heart and blood pressure medications, seizure, reflux, allergy, asthma, anxiety, pain or diabetes medications, make sure you notify the surgery center/hospital before the day of surgery so they can tell you which medications you should take or avoid the day of surgery. 6. No food or drink after midnight the night before surgery unless directed otherwise by surgical center/hospital staff. 7. No alcoholic beverages 24-hours prior to surgery.  No smoking 24-hours prior or 24-hours after  surgery. 8. Wear loose pants or shorts. They should be loose enough to fit over bandages, boots, and casts. 9. Don't wear slip-on shoes. Sneakers are preferred. 10. Bring your boot with you to the surgery center/hospital.  Also bring crutches or a walker if your physician has prescribed it for you.  If you do not have this equipment, it will be provided for you after surgery. 11. If you have not been contacted by the surgery center/hospital by the day before your surgery, call to confirm the date and time of your surgery. 12. Leave-time from work may vary depending on the type of surgery you have.  Appropriate arrangements should be made prior to surgery with your employer. 13. Prescriptions will be provided immediately following surgery by your doctor.  Fill these as soon as possible after surgery and take the medication as directed. Pain medications will not be refilled on weekends and must be approved by the doctor. 14. Remove nail polish on the operative foot and avoid getting pedicures prior to surgery. 15. Wash the night before surgery.  The night before surgery wash the foot and leg well with water and the antibacterial soap provided. Be sure to pay special attention to beneath the toenails and in between the toes.  Wash for at least three (3) minutes. Rinse thoroughly with water and dry well with a towel.  Perform this wash unless told not to do so by your physician.  Enclosed: 1 Ice pack (please put in freezer the night before surgery)   1 Hibiclens skin cleaner     Pre-op instructions  If you have any questions regarding the instructions, please do not hesitate to call our office.  [place]: [address], [place], [place] [address] -- [phone]  [place]: [address] [place], [place] [address] -- [phone]  [place]: [address]  [place], [place] [address] -- [phone]  [place]: [address], [place], [place] [address] -- [phone]  Website:  [email or web address] 

## 2018-10-09 ENCOUNTER — Telehealth: Payer: Self-pay | Admitting: *Deleted

## 2018-10-09 DIAGNOSIS — Z01812 Encounter for preprocedural laboratory examination: Secondary | ICD-10-CM

## 2018-10-09 NOTE — Telephone Encounter (Signed)
"  I'm calling to schedule my surgery with Dr. March Rummage."  He does surgeries on Wednesdays.  Do you have a date you would like?  "I'd like to schedule it as soon as possible."  He can do it on Wednesday.  Someone from the surgical center will give you a call a day or two before your surgery date and give you your arrival time.  You need to go online an register with the surgical center via their online portal.  The instructions are in the brochure that we gave you.  "Where do I go to have my Covid test done?"  Did they tell you where your surgery will be done?  "It's going to be in [place]."  Did they say if it's going to be done at the hospital?  "They gave me a form that said I need to get a physical."  I am assuming it is going to be done at the hospital but let me make sure.  I'll call you back.  I'm returning your call.  Your surgery is supposed to be scheduled for the hospital.  Dr. March Rummage can do it on October 16, 2018 at [facility] at 7:15 am.  All surgery patients scheduled to have surgery at a [facility] must have a history and physical completed by their primary care physician.  If this is not obtained, your surgery will be canceled.  Have you scheduled an appointment with your doctor yet?  "No, I haven't scheduled it yet."  You are also required to have a Covid-19 test.  "I can just go to the lab anywhere and have this done correct?"  You will receive a call from a Pre-admit scheduler, that person will schedule you appointment.  It will probably need to be done this Friday.  You will go to [facility], it is a drive thru process.  "Okay, is there anything else I need to know?"  I think I have informed you of everything.

## 2018-10-10 ENCOUNTER — Other Ambulatory Visit: Payer: Self-pay | Admitting: Podiatry

## 2018-10-10 ENCOUNTER — Telehealth: Payer: Self-pay | Admitting: *Deleted

## 2018-10-10 DIAGNOSIS — L02611 Cutaneous abscess of right foot: Secondary | ICD-10-CM

## 2018-10-10 DIAGNOSIS — L97511 Non-pressure chronic ulcer of other part of right foot limited to breakdown of skin: Secondary | ICD-10-CM

## 2018-10-10 DIAGNOSIS — M79671 Pain in right foot: Secondary | ICD-10-CM

## 2018-10-10 NOTE — Telephone Encounter (Signed)
DOS 10/16/2018; CPT CODES: 73428 - PARTIAL PHALANX EXCISION OR 76811 - AMPUTATION TOE INTERPHALANGEAL, 57262 - INSERTION ANTIBIOTIC BEADS, AND 03559 - INCISION BONE CORTEX OSTEO RIGHT FOOT   BCBS: Effective Date - 04/24/2018 - 04/23/9998   In-Network    Max Per Benefit Period Year-to-Date Remaining  CoInsurance     Deductible     Out-Of-Pocket  $7,150.00 $7,131.66   In-Network Individual  Copay Coinsurance Authorization Required  Not Applicable  74%  Yes   Benefit: 10/07/2018 - 01/22/2019 Utilization Management Organization: UTILIZATION MANAGEMENT :

## 2018-10-11 ENCOUNTER — Ambulatory Visit (INDEPENDENT_AMBULATORY_CARE_PROVIDER_SITE_OTHER): Payer: BC Managed Care – PPO | Admitting: Podiatry

## 2018-10-11 ENCOUNTER — Encounter: Payer: Self-pay | Admitting: Podiatry

## 2018-10-11 ENCOUNTER — Other Ambulatory Visit: Payer: Self-pay

## 2018-10-11 VITALS — Temp 97.9°F

## 2018-10-11 DIAGNOSIS — L02611 Cutaneous abscess of right foot: Secondary | ICD-10-CM | POA: Diagnosis not present

## 2018-10-11 DIAGNOSIS — L03031 Cellulitis of right toe: Secondary | ICD-10-CM

## 2018-10-11 DIAGNOSIS — L97512 Non-pressure chronic ulcer of other part of right foot with fat layer exposed: Secondary | ICD-10-CM | POA: Diagnosis not present

## 2018-10-11 DIAGNOSIS — M869 Osteomyelitis, unspecified: Secondary | ICD-10-CM

## 2018-10-11 NOTE — Telephone Encounter (Signed)
PRE-CERTIFICATION IS NOT REQUIRED  Call Reference # [id number]

## 2018-10-12 ENCOUNTER — Other Ambulatory Visit (HOSPITAL_COMMUNITY)
Admission: RE | Admit: 2018-10-12 | Discharge: 2018-10-12 | Disposition: A | Discharge status: Home / Self Care | Payer: BC Managed Care – PPO | Source: Ambulatory Visit | Attending: Podiatry | Admitting: Podiatry

## 2018-10-12 DIAGNOSIS — Z0000 Encounter for general adult medical examination without abnormal findings: Secondary | ICD-10-CM | POA: Diagnosis not present

## 2018-10-12 DIAGNOSIS — Z1159 Encounter for screening for other viral diseases: Secondary | ICD-10-CM | POA: Insufficient documentation

## 2018-10-13 LAB — SARS CORONAVIRUS 2 (TAT 6-24 HRS): SARS Coronavirus 2: NEGATIVE

## 2018-10-14 LAB — WOUND CULTURE

## 2018-10-14 NOTE — Patient Instructions (Addendum)
Mark NewcomerJulius Stein  10/14/2018   Your procedure is scheduled on: Wednesday 10/16/2018   [facility]              Report to [facility] at   0530 AM               YOU NEED TO HAVE A COVID 19 TEST ON_______ @_______ , THIS TEST MUST BE DONE BEFORE SURGERY, [facility]. ONCE YOUR COVID TEST IS COMPLETED, PLEASE BEGIN THE QUARANTINE INSTRUCTIONS AS OUTLINED IN YOUR HANDOUT.   Call this number if you have problems the morning of surgery [phone]    Remember: NO SOLID FOOD AFTER MIDNIGHT THE NIGHT PRIOR TO SURGERY. NOTHING BY MOUTH EXCEPT CLEAR LIQUIDS UNTIL 3 HOURS PRIOR TO SCHEULED SURGERY. PLEASE FINISH ENSURE DRINK PER SURGEON ORDER 3 HOURS PRIOR TO SCHEDULED SURGERY TIME WHICH NEEDS TO BE COMPLETED AT   0430 am.   CLEAR LIQUID DIET   Foods Allowed                                                                     Foods Excluded  Coffee and tea, regular and decaf                             liquids that you cannot  Plain Jell-O in any flavor                                             see through such as: Fruit ices (not with fruit pulp)                                     milk, soups, orange juice  Iced Popsicles                                    All solid food Carbonated beverages, regular and diet                                    Cranberry, grape and apple juices Sports drinks like Gatorade Lightly seasoned clear broth or consume(fat free) Sugar, honey syrup  Sample Menu Breakfast                                Lunch                                     Supper Cranberry juice                    Beef broth  Chicken broth Jell-O                                     Grape juice                           Apple juice Coffee or tea                        Jell-O                                      Popsicle                                                Coffee or tea                         Coffee or tea  _____________________________________________________________________               BRUSH YOUR TEETH MORNING OF SURGERY AND RINSE YOUR MOUTH OUT, NO CHEWING GUM CANDY OR MINTS.     Take these medicines the morning of surgery with A SIP OF WATER: Hydrocodone-acetaminophen if needed for pain                                 You may not have any metal on your body including hair pins and              piercings  Do not wear jewelry, cologne, lotions, powders or deodorant                          Men may shave face and neck.   Do not bring valuables to the [facility]. [facility] IS NOT             RESPONSIBLE   FOR VALUABLES.  Contacts, dentures or bridgework may not be worn into surgery.       Patients discharged the day of surgery will not be allowed to drive home. IF YOU ARE HAVING SURGERY AND GOING HOME THE SAME DAY, YOU MUST HAVE AN ADULT TO DRIVE YOU HOME AND BE WITH YOU FOR 24 HOURS. YOU MAY GO HOME BY TAXI OR UBER OR ORTHERWISE, BUT AN ADULT MUST ACCOMPANY YOU HOME AND Stein WITH YOU FOR 24 HOURS.  Name and phone number of your driver:               Please read over the following fact sheets you were given: _____________________________________________________________________             [facility] Pre[facility] Before surgery, you can play an important role.  Because skin is not sterile, your skin needs to be as free of germs as possible.  You can reduce the number of germs on your skin by washing with CHG (chlorahexidine gluconate) soap before surgery.  CHG is an antiseptic cleaner which kills germs and bonds with the skin to continue killing germs even after washing. Please DO NOT use if you have  an allergy to CHG or antibacterial soaps.  If your skin becomes reddened/irritated stop using the CHG and inform your nurse when you arrive at [facility]. Do not shave (including legs and underarms) for at least 48 hours prior to the first  CHG shower.  You may shave your face/neck. Please follow these instructions carefully:  1.  Shower with CHG Soap the night before surgery and the  morning of Surgery.  2.  If you choose to wash your hair, wash your hair first as usual with your  normal  shampoo.  3.  After you shampoo, rinse your hair and body thoroughly to remove the  shampoo.                           4.  Use CHG as you would any other liquid soap.  You can apply chg directly  to the skin and wash                       Gently with a scrungie or clean washcloth.  5.  Apply the CHG Soap to your body ONLY FROM THE NECK DOWN.   Do not use on face/ open                           Wound or open sores. Avoid contact with eyes, ears mouth and genitals (private parts).                       Wash face,  Genitals (private parts) with your normal soap.             6.  Wash thoroughly, paying special attention to the area where your surgery  will be performed.  7.  Thoroughly rinse your body with warm water from the neck down.  8.  DO NOT shower/wash with your normal soap after using and rinsing off  the CHG Soap.                9.  Pat yourself dry with a clean towel.            10.  Wear clean pajamas.            11.  Place clean sheets on your bed the night of your first shower and do not  sleep with pets. Day of Surgery : Do not apply any lotions/deodorants the morning of surgery.  Please wear clean clothes to the Stein/surgery Stein.  FAILURE TO FOLLOW THESE INSTRUCTIONS MAY RESULT IN THE CANCELLATION OF YOUR SURGERY PATIENT SIGNATURE_________________________________  NURSE SIGNATURE__________________________________  ________________________________________________________________________   Mark MireIncentive Spirometer  An incentive spirometer is a tool that can help keep your lungs clear and active. This tool measures how well you are filling your lungs with each breath. Taking long deep breaths may help reverse or decrease the  chance of developing breathing (pulmonary) problems (especially infection) following:  A long period of time when you are unable to move or be active. BEFORE THE PROCEDURE   If the spirometer includes an indicator to show your best effort, your nurse or respiratory therapist will set it to a desired goal.  If possible, sit up straight or lean slightly forward. Try not to slouch.  Hold the incentive spirometer in an upright position. INSTRUCTIONS FOR USE  1. Sit on the edge of your bed if possible, or sit up as  far as you can in bed or on a chair. 2. Hold the incentive spirometer in an upright position. 3. Breathe out normally. 4. Place the mouthpiece in your mouth and seal your lips tightly around it. 5. Breathe in slowly and as deeply as possible, raising the piston or the ball toward the top of the column. 6. Hold your breath for 3-5 seconds or for as long as possible. Allow the piston or ball to fall to the bottom of the column. 7. Remove the mouthpiece from your mouth and breathe out normally. 8. Rest for a few seconds and repeat Steps 1 through 7 at least 10 times every 1-2 hours when you are awake. Take your time and take a few normal breaths between deep breaths. 9. The spirometer may include an indicator to show your best effort. Use the indicator as a goal to work toward during each repetition. 10. After each set of 10 deep breaths, practice coughing to be sure your lungs are clear. If you have an incision (the cut made at the time of surgery), support your incision when coughing by placing a pillow or rolled up towels firmly against it. Once you are able to get out of bed, walk around indoors and cough well. You may stop using the incentive spirometer when instructed by your caregiver.  RISKS AND COMPLICATIONS  Take your time so you do not get dizzy or light-headed.  If you are in pain, you may need to take or ask for pain medication before doing incentive spirometry. It is harder  to take a deep breath if you are having pain. AFTER USE  Rest and breathe slowly and easily.  It can be helpful to keep track of a log of your progress. Your caregiver can provide you with a simple table to help with this. If you are using the spirometer at home, follow these instructions: [place] IF:   You are having difficultly using the spirometer.  You have trouble using the spirometer as often as instructed.  Your pain medication is not giving enough relief while using the spirometer.  You develop fever of 100.5 F (38.1 C) or higher. SEEK IMMEDIATE MEDICAL CARE IF:   You cough up bloody sputum that had not been present before.  You develop fever of 102 F (38.9 C) or greater.  You develop worsening pain at or near the incision site. MAKE SURE YOU:   Understand these instructions.  Will watch your condition.  Will get help right away if you are not doing well or get worse. Document Released: 08/21/2006 Document Revised: 07/03/2011 Document Reviewed: 10/22/2006 [place] [facility] Patient Information 2014 [place], [place].   ________________________________________________________________________

## 2018-10-15 ENCOUNTER — Encounter (HOSPITAL_COMMUNITY): Payer: Self-pay | Admitting: Emergency Medicine

## 2018-10-15 ENCOUNTER — Inpatient Hospital Stay (HOSPITAL_COMMUNITY)
Admission: RE | Admit: 2018-10-15 | Discharge: 2018-10-15 | Disposition: A | Discharge status: Home / Self Care | Payer: Managed Care, Other (non HMO) | Source: Ambulatory Visit

## 2018-10-15 ENCOUNTER — Telehealth: Payer: Self-pay | Admitting: *Deleted

## 2018-10-15 DIAGNOSIS — Z6835 Body mass index (BMI) 35.0-35.9, adult: Secondary | ICD-10-CM | POA: Diagnosis not present

## 2018-10-15 DIAGNOSIS — Z01818 Encounter for other preprocedural examination: Secondary | ICD-10-CM | POA: Diagnosis not present

## 2018-10-15 NOTE — Progress Notes (Signed)
HGBA1C, CBCDIFF, CMP, LIPID PANEL DONE ON 10-12-2018  ON CHART

## 2018-10-15 NOTE — Telephone Encounter (Signed)
"  I am calling from [facility].  Can you fax [place] the results of the bloodwork for [facility].  I see in your notes that the test was done.  Instead of having him come here for the same tests, we'll just see him as a same day surgery admit.  We'll take care of the other things that need to be done the morning of the surgery. Instead of having him run around everywhere."  I haven't received it yet.  Once I get it, I'll fax it to you.  "Okay, if you can fax it to [place] as soon as you get it, that would be great.  You can fax it to [phone]."

## 2018-10-15 NOTE — Telephone Encounter (Signed)
"  Mr. Mark Stein is in our office today.  He is here for a physical for a surgery that's scheduled for tomorrow.  We need the forms that need to be completed for his physical faxed over to [place]."  What's your fax number?  "The fax number you can send it to is [phone]."  I'll send it over.  "This is Mertha Finders.  I just finished my blood work.  They want to make sure they have the correct fax number.  What fax number should they send it to?"  Have them send it to [phone].

## 2018-10-15 NOTE — Telephone Encounter (Signed)
I attempted to call [facility].  I could not reach a person.  I left a message requesting a copy of the history and physical form as well as the lab results.  I asked them to fax it to me at [phone].

## 2018-10-15 NOTE — Telephone Encounter (Signed)
"  We have been trying to get in contact with the patient.  He's not answering his phone.  He was supposed to have had an appointment today but he has not shown up."  I'll see if I can contact him.  I am calling you about two things.  One, is to see if you had your history and physical completed by your primary care physician.  "I am at my doctor's office now."  Okay great, get them to send it to [place] as soon as possible.  The second thing is, the pre-admission testing nurse is trying to contact you about your appointment today.  "I got my times mixed up. I'm going there at 1 pm."    I am calling regarding your labs that Dr. March Rummage has requested for a CBC, Sedimentation Rate, and C-Reactive Protein.  Have you had that done.  "I'm still at the doctor's office now.  I gave them the paperwork that Dr. March Rummage gave me for that."  So you are having it done there?  "Yes, I am."  We need the results sent to [place] as soon as possible.  "I'll let them know.  You want them to fax it to you?"  Yes, that is correct.  "Do you want me to call you when I'm finished?"  Yes, that will be fine.

## 2018-10-15 NOTE — Progress Notes (Addendum)
Mark Stein  10/15/2018   Your procedure is scheduled on: 10-16-2018   Report to [facility] Main  Entrance    Report to [place] at 5:30AM       Call this number if you have problems the morning of surgery [place], NO CHEWING GUM CANDY OR MINTS.     Remember: Do not eat food After Midnight. Do not eat food After Midnight. YOU MAY HAVE CLEAR LIQUIDS FROM MIDNIGHT UNTIL 4:30AM. At 4:30AM Please finish the prescribed Pre-Surgery ENSURE drink. Nothing by mouth after you finish the ENSURE drink !     CLEAR LIQUID DIET   Foods Allowed                                                                     Foods Excluded  Coffee and tea, regular and decaf                             liquids that you cannot  Plain Jell-O in any flavor                                             see through such as: Fruit ices (not with fruit pulp)                                     milk, soups, orange juice  Iced Popsicles                                    All solid food Carbonated beverages, regular and diet                                    Cranberry, grape and apple juices Sports drinks like Gatorade Lightly seasoned clear broth or consume(fat free) Sugar, honey syrup  Sample Menu Breakfast                                Lunch                                     Supper Cranberry juice                    Beef broth                            Chicken broth Jell-O  Grape juice                           Apple juice Coffee or tea                        Jell-O                                      Popsicle                                                Coffee or tea                        Coffee or tea  _____________________________________________________________________    Take these medicines the morning of surgery with A SIP OF WATER: clindamycin, hydrocodone if  needed                                 You may not have any metal on your body including hair pins and piercings  Do not wear jewelry, make-up, lotions, powders or perfumes, deodorant                    Men may shave face and neck.   Do not bring valuables to the hospital. [facility] IS NOT RESPONSIBLE   FOR VALUABLES.  Contacts, dentures or bridgework may not be worn into surgery.       Patients discharged the day of surgery will not be allowed to drive home. IF YOU ARE HAVING SURGERY AND GOING HOME THE SAME DAY, YOU MUST HAVE AN ADULT TO DRIVE YOU HOME AND BE WITH YOU FOR 24 HOURS. YOU MAY GO HOME BY TAXI OR UBER OR ORTHERWISE, BUT AN ADULT MUST ACCOMPANY YOU HOME AND STAY WITH YOU FOR 24 HOURS.   Name and phone number of your driver:  Special Instructions: N/A              Please read over the following fact sheets you were given: _____________________________________________________________________

## 2018-10-16 ENCOUNTER — Encounter (HOSPITAL_COMMUNITY): Payer: Self-pay

## 2018-10-16 ENCOUNTER — Encounter (HOSPITAL_COMMUNITY)
Admission: RE | Disposition: A | Discharge status: Home / Self Care | Payer: Self-pay | Source: Home / Self Care | Attending: Podiatry

## 2018-10-16 ENCOUNTER — Ambulatory Visit (HOSPITAL_COMMUNITY): Payer: BC Managed Care – PPO | Admitting: Anesthesiology

## 2018-10-16 ENCOUNTER — Ambulatory Visit (HOSPITAL_COMMUNITY): Payer: BC Managed Care – PPO | Admitting: Physician Assistant

## 2018-10-16 ENCOUNTER — Ambulatory Visit (HOSPITAL_COMMUNITY): Payer: BC Managed Care – PPO

## 2018-10-16 ENCOUNTER — Encounter: Payer: Self-pay | Admitting: Podiatry

## 2018-10-16 ENCOUNTER — Ambulatory Visit (HOSPITAL_COMMUNITY)
Admission: RE | Admit: 2018-10-16 | Discharge: 2018-10-16 | Disposition: A | Discharge status: Home / Self Care | Payer: BC Managed Care – PPO | Attending: Podiatry | Admitting: Podiatry

## 2018-10-16 DIAGNOSIS — L97514 Non-pressure chronic ulcer of other part of right foot with necrosis of bone: Secondary | ICD-10-CM

## 2018-10-16 DIAGNOSIS — Z9889 Other specified postprocedural states: Secondary | ICD-10-CM

## 2018-10-16 DIAGNOSIS — M86171 Other acute osteomyelitis, right ankle and foot: Secondary | ICD-10-CM | POA: Diagnosis not present

## 2018-10-16 DIAGNOSIS — Z87891 Personal history of nicotine dependence: Secondary | ICD-10-CM | POA: Insufficient documentation

## 2018-10-16 DIAGNOSIS — L97519 Non-pressure chronic ulcer of other part of right foot with unspecified severity: Secondary | ICD-10-CM | POA: Diagnosis not present

## 2018-10-16 DIAGNOSIS — M869 Osteomyelitis, unspecified: Secondary | ICD-10-CM | POA: Insufficient documentation

## 2018-10-16 HISTORY — DX: Other specified health status: Z78.9

## 2018-10-16 HISTORY — PX: EXCISION PARTIAL PHALANX: SHX6617

## 2018-10-16 IMAGING — DX RIGHT FOOT - 2 VIEW
2 series · 2 of 2 positions shown · non-contrast
Comparison: Right foot x-rays dated [DATE].

CLINICAL DATA: Right great toe ulcer status post debridement.

EXAM:
RIGHT FOOT - 2 VIEW

[foot ap]
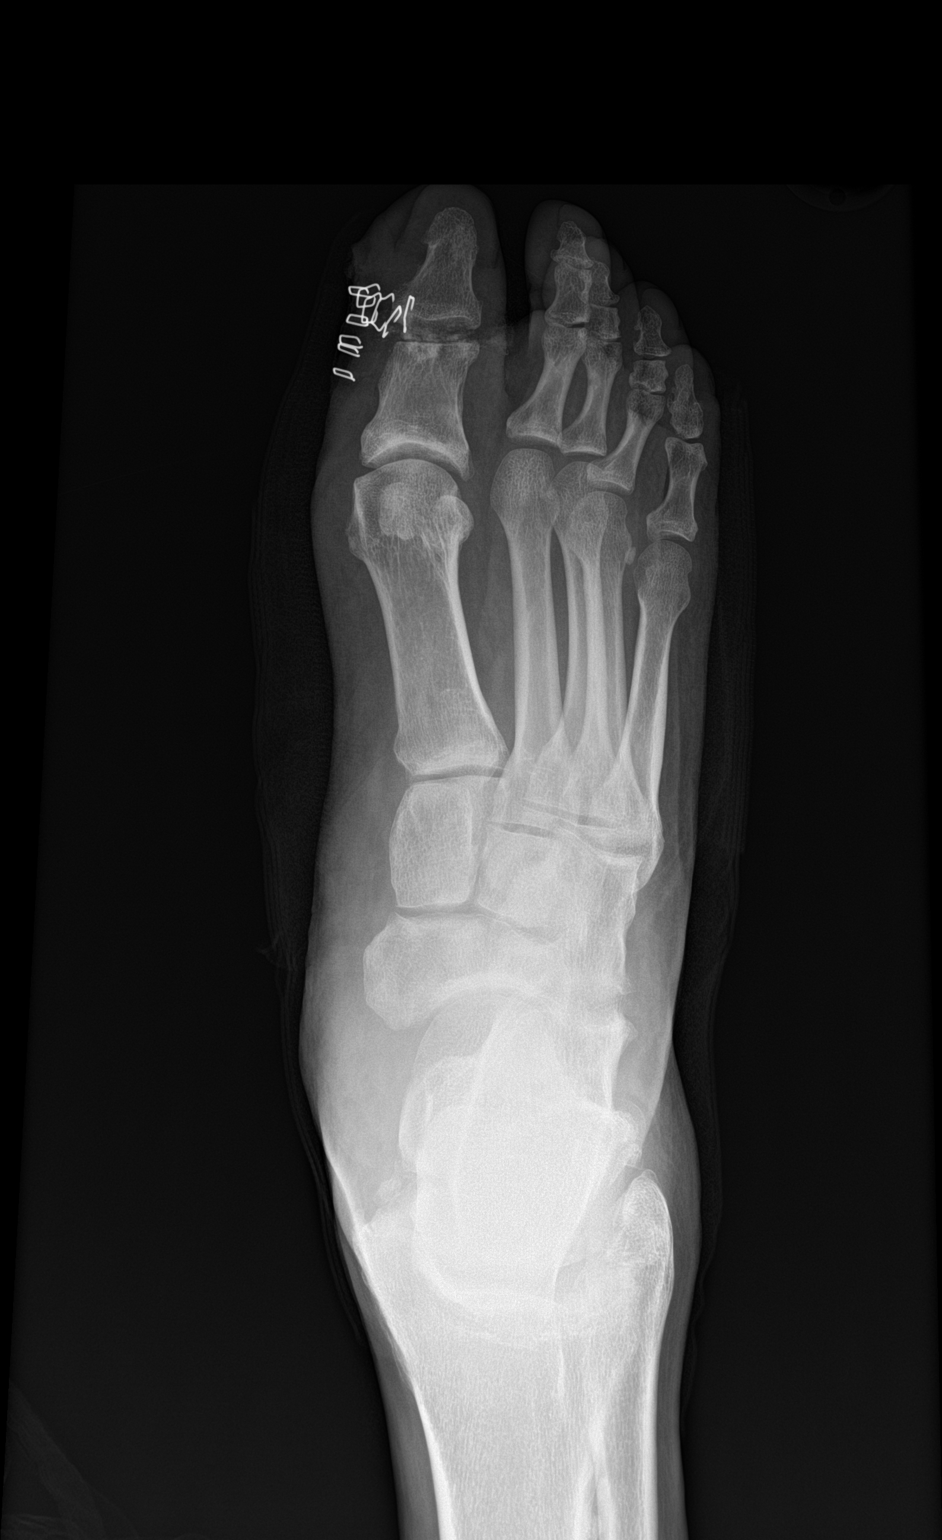

[foot lat]
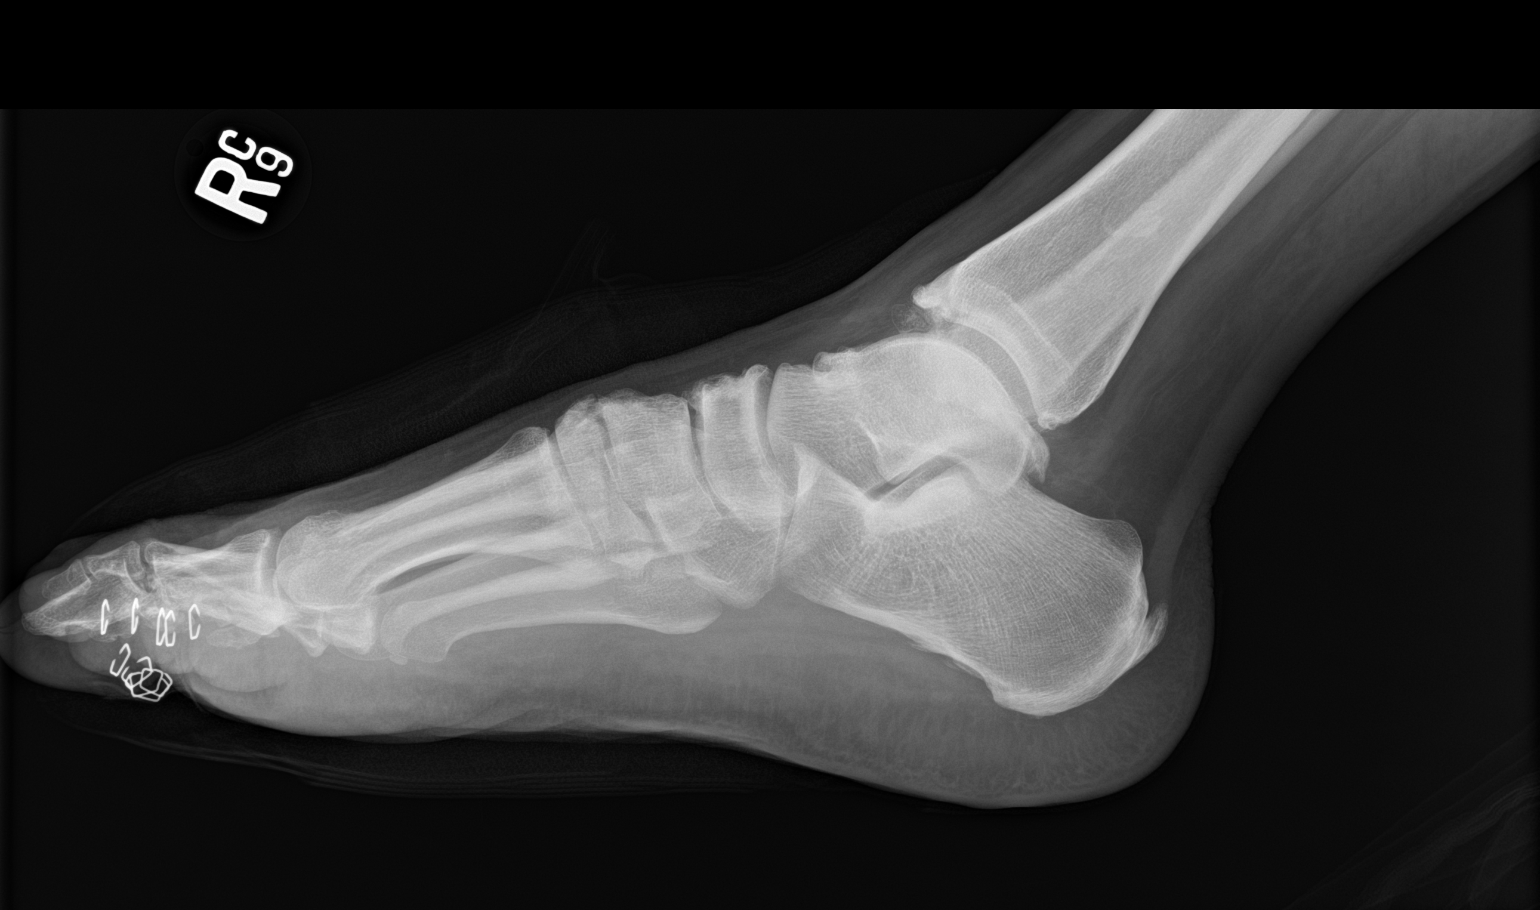

[2 of 2 positions shown; findings below may reference images not displayed]

FINDINGS: Postsurgical changes of the great toe with osteotomy of the medial
first proximal phalanx head and antibiotic beads in the first IP
joint space. No osseous destruction or periosteal reaction. No acute
fracture or dislocation. Unchanged dorsal midfoot, first MTP joint,
and tibiotalar joint degenerative spurring. Unchanged large Achilles
enthesophyte. Bone mineralization is normal.
IMPRESSION: 1. Postsurgical changes of the great toe as described above. No
acute osseous abnormality.

## 2018-10-16 SURGERY — EXCISION, PHALANX, PARTIAL
Anesthesia: General | Site: Foot | Laterality: Right

## 2018-10-16 MED ORDER — KETOROLAC TROMETHAMINE 30 MG/ML IJ SOLN: 30.0000 mg | Freq: Once | INTRAMUSCULAR | Status: DC | PRN

## 2018-10-16 MED ORDER — FENTANYL CITRATE (PF) 100 MCG/2ML IJ SOLN
INTRAMUSCULAR | Status: AC
  Filled 2018-10-16: qty 2

## 2018-10-16 MED ORDER — OXYCODONE HCL 5 MG PO TABS: 5.0000 mg | ORAL_TABLET | Freq: Once | ORAL | Status: DC | PRN

## 2018-10-16 MED ORDER — DEXMEDETOMIDINE HCL IN NACL 200 MCG/50ML IV SOLN
INTRAVENOUS | Status: AC
  Filled 2018-10-16: qty 50

## 2018-10-16 MED ORDER — ACETAMINOPHEN 160 MG/5ML PO SOLN: 325.0000 mg | ORAL | Status: DC | PRN

## 2018-10-16 MED ORDER — DEXAMETHASONE SODIUM PHOSPHATE 10 MG/ML IJ SOLN
INTRAMUSCULAR | Status: DC | PRN
  Administered 2018-10-16: 10 mg via INTRAVENOUS

## 2018-10-16 MED ORDER — 0.9 % SODIUM CHLORIDE (POUR BTL) OPTIME
TOPICAL | Status: DC | PRN
  Administered 2018-10-16: 07:00:00 1000 mL

## 2018-10-16 MED ORDER — ONDANSETRON HCL 4 MG/2ML IJ SOLN
INTRAMUSCULAR | Status: AC
  Filled 2018-10-16: qty 2

## 2018-10-16 MED ORDER — KETOROLAC TROMETHAMINE 30 MG/ML IJ SOLN
INTRAMUSCULAR | Status: DC | PRN
  Administered 2018-10-16: 30 mg via INTRAVENOUS

## 2018-10-16 MED ORDER — LIDOCAINE 2% (20 MG/ML) 5 ML SYRINGE
INTRAMUSCULAR | Status: AC
  Filled 2018-10-16: qty 5

## 2018-10-16 MED ORDER — LIDOCAINE 2% (20 MG/ML) 5 ML SYRINGE
INTRAMUSCULAR | Status: DC | PRN
  Administered 2018-10-16: 100 mg via INTRAVENOUS

## 2018-10-16 MED ORDER — ACETAMINOPHEN 325 MG PO TABS: 325.0000 mg | ORAL_TABLET | ORAL | Status: DC | PRN

## 2018-10-16 MED ORDER — KETOROLAC TROMETHAMINE 30 MG/ML IJ SOLN
INTRAMUSCULAR | Status: AC
  Filled 2018-10-16: qty 1

## 2018-10-16 MED ORDER — FENTANYL CITRATE (PF) 100 MCG/2ML IJ SOLN: 25.0000 ug | INTRAMUSCULAR | Status: DC | PRN

## 2018-10-16 MED ORDER — LACTATED RINGERS IV SOLN
INTRAVENOUS | Status: DC
  Administered 2018-10-16 (×2): via INTRAVENOUS

## 2018-10-16 MED ORDER — CLINDAMYCIN HCL 300 MG PO CAPS: 300.0000 mg | ORAL_CAPSULE | Freq: Three times a day (TID) | ORAL | 0 refills | Status: DC

## 2018-10-16 MED ORDER — MIDAZOLAM HCL 5 MG/5ML IJ SOLN
INTRAMUSCULAR | Status: DC | PRN
  Administered 2018-10-16: 2 mg via INTRAVENOUS

## 2018-10-16 MED ORDER — PROPOFOL 10 MG/ML IV BOLUS
INTRAVENOUS | Status: AC
  Filled 2018-10-16: qty 40

## 2018-10-16 MED ORDER — BUPIVACAINE HCL (PF) 0.5 % IJ SOLN
INTRAMUSCULAR | Status: DC | PRN
  Administered 2018-10-16: 5 mL

## 2018-10-16 MED ORDER — CEFAZOLIN SODIUM-DEXTROSE 2-4 GM/100ML-% IV SOLN
2.0000 g | INTRAVENOUS | Status: AC
  Administered 2018-10-16: 2 g via INTRAVENOUS
  Filled 2018-10-16: qty 100

## 2018-10-16 MED ORDER — ONDANSETRON HCL 4 MG/2ML IJ SOLN
INTRAMUSCULAR | Status: DC | PRN
  Administered 2018-10-16: 4 mg via INTRAVENOUS

## 2018-10-16 MED ORDER — VANCOMYCIN HCL 1000 MG IV SOLR
INTRAVENOUS | Status: DC | PRN
  Administered 2018-10-16: 1000 mg

## 2018-10-16 MED ORDER — DEXAMETHASONE SODIUM PHOSPHATE 10 MG/ML IJ SOLN
INTRAMUSCULAR | Status: AC
  Filled 2018-10-16: qty 1

## 2018-10-16 MED ORDER — PROPOFOL 10 MG/ML IV BOLUS
INTRAVENOUS | Status: DC | PRN
  Administered 2018-10-16: 200 mg via INTRAVENOUS

## 2018-10-16 MED ORDER — OXYCODONE HCL 5 MG/5ML PO SOLN: 5.0000 mg | Freq: Once | ORAL | Status: DC | PRN

## 2018-10-16 MED ORDER — BUPIVACAINE HCL (PF) 0.5 % IJ SOLN
INTRAMUSCULAR | Status: AC
  Filled 2018-10-16: qty 30

## 2018-10-16 MED ORDER — FENTANYL CITRATE (PF) 100 MCG/2ML IJ SOLN
INTRAMUSCULAR | Status: DC | PRN
  Administered 2018-10-16 (×2): 25 ug via INTRAVENOUS
  Administered 2018-10-16 (×3): 50 ug via INTRAVENOUS

## 2018-10-16 MED ORDER — MIDAZOLAM HCL 2 MG/2ML IJ SOLN
INTRAMUSCULAR | Status: AC
  Filled 2018-10-16: qty 2

## 2018-10-16 MED ORDER — VANCOMYCIN HCL 1000 MG IV SOLR
INTRAVENOUS | Status: AC
  Filled 2018-10-16: qty 1000

## 2018-10-16 MED ORDER — OXYCODONE-ACETAMINOPHEN 5-325 MG PO TABS: 1.0000 | ORAL_TABLET | ORAL | 0 refills | Status: DC | PRN

## 2018-10-16 MED ORDER — MEPERIDINE HCL 50 MG/ML IJ SOLN: 6.2500 mg | INTRAMUSCULAR | Status: DC | PRN

## 2018-10-16 MED ORDER — DEXMEDETOMIDINE HCL IN NACL 200 MCG/50ML IV SOLN
INTRAVENOUS | Status: DC | PRN
  Administered 2018-10-16 (×5): 8 ug via INTRAVENOUS

## 2018-10-16 MED ORDER — ONDANSETRON HCL 4 MG/2ML IJ SOLN: 4.0000 mg | Freq: Once | INTRAMUSCULAR | Status: DC | PRN

## 2018-10-16 MED ORDER — EPHEDRINE SULFATE-NACL 50-0.9 MG/10ML-% IV SOSY
PREFILLED_SYRINGE | INTRAVENOUS | Status: DC | PRN
  Administered 2018-10-16 (×2): 10 mg via INTRAVENOUS

## 2018-10-16 SURGICAL SUPPLY — 37 items
BANDAGE ACE 4X5 VEL STRL LF (GAUZE/BANDAGES/DRESSINGS) ×3 IMPLANT
BANDAGE ACE 6X5 VEL STRL LF (GAUZE/BANDAGES/DRESSINGS) ×3 IMPLANT
BLADE OSCILLATING/SAGITTAL (BLADE) ×3
BLADE SURG 15 STRL LF DISP TIS (BLADE) ×2 IMPLANT
BLADE SURG 15 STRL SS (BLADE) ×3
BLADE SW THK.38XMED LNG THN (BLADE) ×2 IMPLANT
CLEANER TIP ELECTROSURG 2X2 (MISCELLANEOUS) ×3 IMPLANT
CONT SPEC 4OZ CLIKSEAL STRL BL (MISCELLANEOUS) ×3 IMPLANT
COVER MAYO STAND STRL (DRAPES) ×3 IMPLANT
COVER SURGICAL LIGHT HANDLE (MISCELLANEOUS) ×3 IMPLANT
COVER WAND RF STERILE (DRAPES) IMPLANT
CUFF TOURN SGL QUICK 24 (TOURNIQUET CUFF) ×3
CUFF TRNQT CYL 24X4X16.5-23 (TOURNIQUET CUFF) ×2 IMPLANT
DECANTER SPIKE VIAL GLASS SM (MISCELLANEOUS) ×3 IMPLANT
GAUZE SPONGE 4X4 12PLY STRL (GAUZE/BANDAGES/DRESSINGS) ×5 IMPLANT
GAUZE XEROFORM 5X9 LF (GAUZE/BANDAGES/DRESSINGS) ×3 IMPLANT
GLOVE BIO SURGEON STRL SZ7.5 (GLOVE) ×3 IMPLANT
GLOVE BIOGEL PI IND STRL 8 (GLOVE) ×2 IMPLANT
GLOVE BIOGEL PI INDICATOR 8 (GLOVE) ×1
GOWN STRL REUS W/TWL XL LVL3 (GOWN DISPOSABLE) ×3 IMPLANT
KIT BASIN OR (CUSTOM PROCEDURE TRAY) ×3 IMPLANT
KIT STIMULAN RAPID CURE  10CC (Orthopedic Implant) ×1 IMPLANT
KIT STIMULAN RAPID CURE 10CC (Orthopedic Implant) ×1 IMPLANT
KIT TURNOVER KIT A (KITS) IMPLANT
MARKER SKIN DUAL TIP RULER LAB (MISCELLANEOUS) ×3 IMPLANT
NEEDLE HYPO 25X1 1.5 SAFETY (NEEDLE) ×3 IMPLANT
PACK ORTHO EXTREMITY (CUSTOM PROCEDURE TRAY) ×3 IMPLANT
PADDING UNDERCAST 2 STRL (CAST SUPPLIES) ×1
PADDING UNDERCAST 2X4 STRL (CAST SUPPLIES) ×2 IMPLANT
SUT PROLENE 4 0 PS 2 18 (SUTURE) ×3 IMPLANT
SUT VIC AB 1 CT1 27 (SUTURE) ×3
SUT VIC AB 1 CT1 27XBRD ANTBC (SUTURE) ×2 IMPLANT
SYR 20CC LL (SYRINGE) ×3 IMPLANT
TOWEL OR 17X26 10 PK STRL BLUE (TOWEL DISPOSABLE) ×3 IMPLANT
TOWEL OR NON WOVEN STRL DISP B (DISPOSABLE) ×3 IMPLANT
TRAY PREP A LATEX SAFE STRL (SET/KITS/TRAYS/PACK) ×3 IMPLANT
UNDERPAD 30X30 (UNDERPADS AND DIAPERS) ×12 IMPLANT

## 2018-10-16 NOTE — Progress Notes (Signed)
  Subjective:  Patient ID: Mark Stein, male    DOB: [birth year],  MRN: [id number]  Chief Complaint  Patient presents with  . Foot Ulcer    Follow up ulcers hallux bilateral   "I hope they are better"    33 y.o. male presents for wound care. Hx as above. Did not get labs drawn last visit.  Review of Systems: Negative except as noted in the HPI. Denies N/V/F/Ch.  Past Medical History:  Diagnosis Date  . Medical history non-contributory    No current facility-administered medications for this visit.   Current Outpatient Medications:  .  clindamycin (CLEOCIN) 300 MG capsule, Take 1 capsule (300 mg total) by mouth 3 (three) times daily., Disp: 21 capsule, Rfl: 0 .  oxyCODONE-acetaminophen (PERCOCET) 5-325 MG tablet, Take 1 tablet by mouth every 4 (four) hours as needed for severe pain., Disp: 20 tablet, Rfl: 0  Facility-Administered Medications Ordered in Other Visits:  .  ceFAZolin (ANCEF) IVPB 2g/100 mL premix, 2 g, Intravenous, On Call to OR, Evelina Bucy, DPM .  lactated ringers infusion, , Intravenous, Continuous, Evelina Bucy, DPM, Last Rate: 10 mL/hr at 10/16/18 8921  Social History   Tobacco Use  Smoking Status Former Smoker  Smokeless Tobacco Never Used    No Known Allergies Objective:   Vitals:   10/11/18 1540  Temp: 97.9 F (36.6 C)   There is no height or weight on file to calculate BMI. Constitutional Well developed. Well nourished.  Vascular Dorsalis pedis pulses palpable bilaterally. Posterior tibial pulses palpable bilaterally. Capillary refill normal to all digits.  No cyanosis or clubbing noted. Pedal hair growth normal.  Neurologic Normal speech. Oriented to person, place, and time. Protective sensation absent  Dermatologic R hallux ulcer improved, erythema resolving, edema remains but decreased. No longer probes to bone. No active purulence. No active warmth.   Orthopedic: Pain to palpation right hallux   Radiographs: None today.  Assessment:   1. Osteomyelitis of right foot, unspecified type ([place])   2. Skin ulcer of toe of right foot with fat layer exposed ([place])   3. Cellulitis and abscess of toe of right foot    Plan:  Patient was evaluated and treated and all questions answered.  R hallux ulceration with concern for underlying OM.  -Contine ABx -Reordered labs - CBC, CRP, ESR. Advised to bring them with him to PAT. -Proceed with planned surgery next week. -Continue daily dressing changes.  No follow-ups on file.

## 2018-10-16 NOTE — H&P (Signed)
  Subjective:  Patient ID: Mark Stein, male    DOB: 24-Jun-1985,  MRN: [id number]  Seen in pre-op. Thinks the wound is doing ok. Denies new issues. Objective:   Vitals:   10/16/18 0612  BP: (!) 156/107  Pulse: 98  Resp: 18  Temp: 98.2 F (36.8 C)  SpO2: 99%   General AA&O x3. Normal mood and affect.  Vascular Dorsalis pedis and posterior tibial pulses 2/4 bilat. Brisk capillary refill to all digits. Pedal hair present.  Neurologic Epicritic sensation grossly intact.  Dermatologic R hallux ulcer with granular base, ~1x1 cm ulcer without probe to bone at this time. Hallux slightly edematous.  Orthopedic: MMT 5/5 in dorsiflexion, plantarflexion, inversion, and eversion. Normal joint ROM without pain or crepitus.    Assessment & Plan:  Patient was evaluated and treated and all questions answered.  Right hallux ulcer with concern of OM -Discussed proceeding today with arthroplasty, packing of abx beads, wound closure given previous history of wound extent. Discussed concern for wound worsening once abx stopped. Patient in agreement -Labs reviewed. CPR and ESR not drawn. -Proceed to OR as planned.  Evelina Bucy, DPM  Accessible via secure chat for questions or concerns.

## 2018-10-16 NOTE — Progress Notes (Signed)
Subjective:  Patient ID: Mark NewcomerJulius Clugston, male    DOB: 16-Sep-1985,  MRN: [id number]  No chief complaint on file.   33 y.o. male presents for wound care. States that his right great toe has been swollen for a few days.  Reports 10-10 sharp shooting pain denies drainage endorses redness and swelling.  Denies prior treatments.  Left great toe wound has been doing about the same has been treating himself. Was unable to have surgery trying to plan financially to do so.  Review of Systems: Negative except as noted in the HPI. Denies N/V/F/Ch.  Past Medical History:  Diagnosis Date  . Medical history non-contributory    No current facility-administered medications for this visit.   Current Outpatient Medications:  .  clindamycin (CLEOCIN) 300 MG capsule, Take 1 capsule (300 mg total) by mouth 3 (three) times daily., Disp: 21 capsule, Rfl: 0 .  oxyCODONE-acetaminophen (PERCOCET) 5-325 MG tablet, Take 1 tablet by mouth every 4 (four) hours as needed for severe pain., Disp: 20 tablet, Rfl: 0  Facility-Administered Medications Ordered in Other Visits:  .  ceFAZolin (ANCEF) IVPB 2g/100 mL premix, 2 g, Intravenous, On Call to OR, [place] LiterPrice, Michael J, DPM .  lactated ringers infusion, , Intravenous, Continuous, [place] LiterPrice, Michael J, DPM, Last Rate: 10 mL/hr at 10/16/18 [id number]  Social History   Tobacco Use  Smoking Status Former Smoker  Smokeless Tobacco Never Used    No Known Allergies Objective:  There were no vitals filed for this visit. There is no height or weight on file to calculate BMI. Constitutional Well developed. Well nourished.  Vascular Dorsalis pedis pulses palpable bilaterally. Posterior tibial pulses palpable bilaterally. Capillary refill normal to all digits.  No cyanosis or clubbing noted. Pedal hair growth normal.  Neurologic Normal speech. Oriented to person, place, and time. Protective sensation absent  Dermatologic R hallux edematous, erythematous, fluctuant. Purulent  drainage upon I&D. Wound probes to bone upon debridement. HPK medially with sinus tract laterally.  Orthopedic: Pain to palpation right hallux   Radiographs: Taken reviewed edematous digit with flocculence noted in the great toe.  Possible subtle erosion of the medial aspect of the proximal phalanx with slight lucency however no definite cortical erosions noted Assessment:   1. Skin ulcer of right foot, limited to breakdown of skin (HCC)   2. Bilateral foot pain   3. Cellulitis and abscess of toe of right foot   4. Acute osteomyelitis of toe, right (HCC)    Plan:  Patient was evaluated and treated and all questions answered.  Ulcer Right hallux -Debridement as below. -Dressed with betadine, iodoform packing, DSD. Advised to pack daily. -Continue off-loading with surgical shoe. -Rx clindamycin for infection and Norco for pain -Discussed given extensive infection and concern for bone infection he may end up with amputation of digit however we did discuss that given his age and lack of comorbidities it is possible to try to salvage it.  Would consider resection infected wound with packing with antibiotic beads -Patient has failed all conservative therapy and wishes to proceed with surgical intervention. All risks, benefits, and alternatives discussed with patient. No guarantees given. Consent reviewed and signed by patient. -Planned procedures: Right great toe debridement of wound possible partial amputation if indicated vs removal of infected bone  Procedure: Incision and drainage Anesthesia: Lidocaine 1% plain; 3mL Instrumentation: 15 blade, hemostat, scissors and forcep Technique: Following sterile skin prep and anesthetization, the fluctuant bulla was incised with a 15 blade. Approx 10 ccs of purulence was expressed  from the bulla. This was collected for culture. Necrotic tissue was sharply excised with scissors.  Dressing: Dry, sterile, compression dressing. Disposition: Patient  tolerated procedure well. Patient to return in 1 week for follow-up.   No follow-ups on file.

## 2018-10-16 NOTE — Telephone Encounter (Signed)
I received the history and physical form from [place], [place].

## 2018-10-16 NOTE — Anesthesia Preprocedure Evaluation (Addendum)
Anesthesia Evaluation  Patient identified by MRN, date of birth, ID band Patient awake    Reviewed: Allergy & Precautions, NPO status , Patient's Chart, lab work & pertinent test results  Airway Mallampati: I       Dental no notable dental hx. (+) Teeth Intact   Pulmonary neg pulmonary ROS, former smoker,    Pulmonary exam normal breath sounds clear to auscultation       Cardiovascular negative cardio ROS Normal cardiovascular exam Rhythm:Regular Rate:Normal     Neuro/Psych negative psych ROS   GI/Hepatic negative GI ROS, Neg liver ROS,   Endo/Other  negative endocrine ROS  Renal/GU negative Renal ROS  negative genitourinary   Musculoskeletal   Abdominal (+) + obese,   Peds  Hematology negative hematology ROS (+)   Anesthesia Other Findings   Reproductive/Obstetrics                             Anesthesia Physical Anesthesia Plan  ASA: II  Anesthesia Plan: General   Post-op Pain Management:    Induction: Intravenous  PONV Risk Score and Plan: 3  Airway Management Planned: LMA  Additional Equipment:   Intra-op Plan:   Post-operative Plan: Extubation in OR  Informed Consent: I have reviewed the patients History and Physical, chart, labs and discussed the procedure including the risks, benefits and alternatives for the proposed anesthesia with the patient or authorized representative who has indicated his/her understanding and acceptance.     Dental advisory given  Plan Discussed with: CRNA  Anesthesia Plan Comments:        Anesthesia Quick Evaluation

## 2018-10-16 NOTE — Brief Op Note (Signed)
10/16/2018  8:34 AM  PATIENT:  Mertha Finders  33 y.o. male  PRE-OPERATIVE DIAGNOSIS:  OSTEOMYELITIS RIGHT FOOT  POST-OPERATIVE DIAGNOSIS:  osteomyelitis right   PROCEDURE:  Procedure(s): right great toe wound debridement and closure. arthroplasty packing antiobiotic beads. distal phlax condylectomy, . bone biopsy. (Right)  SURGEON:  Surgeon(s) and Role:    * Evelina Bucy, DPM - Primary  PHYSICIAN ASSISTANT:   ASSISTANTS: none   ANESTHESIA:   general  EBL:  5 ml   BLOOD ADMINISTERED:none  DRAINS: none   LOCAL MEDICATIONS USED:  MARCAINE    and Amount: 5 ml  SPECIMEN:   ID Type Source Tests Collected by Time Destination  1 : right great toe bone Tissue Bone SURGICAL PATHOLOGY Evelina Bucy, DPM 10/16/2018 0758   A : bone right great toe aerobic anaerobic culture Tissue Bone AEROBIC/ANAEROBIC CULTURE (SURGICAL/DEEP WOUND) Evelina Bucy, DPM 10/16/2018 0806      DISPOSITION OF SPECIMEN:  PATHOLOGY, micro  COUNTS:  YES  TOURNIQUET:  * No tourniquets in log *  DICTATION: .Dragon Dictation  PLAN OF CARE: Discharge to home after PACU  PATIENT DISPOSITION:  PACU - hemodynamically stable.   Delay start of Pharmacological VTE agent (>24hrs) due to surgical blood loss or risk of bleeding: not applicable

## 2018-10-16 NOTE — Anesthesia Procedure Notes (Signed)
Procedure Name: LMA Insertion Date/Time: 10/16/2018 7:20 AM Performed by: Genelle Bal, CRNA Pre-anesthesia Checklist: Patient identified, Emergency Drugs available, Suction available and Patient being monitored Patient Re-evaluated:Patient Re-evaluated prior to induction Oxygen Delivery Method: Circle system utilized Preoxygenation: Pre-oxygenation with 100% oxygen Induction Type: IV induction Ventilation: Mask ventilation without difficulty LMA: LMA inserted LMA Size: 5.0 Number of attempts: 1 Airway Equipment and Method: Bite block Placement Confirmation: positive ETCO2 Tube secured with: Tape Dental Injury: Teeth and Oropharynx as per pre-operative assessment

## 2018-10-16 NOTE — Anesthesia Postprocedure Evaluation (Signed)
Anesthesia Post Note  Patient: Mark Stein  Procedure(s) Performed: right great toe wound debridement and closure. arthroplasty packing antiobiotic beads. distal phlax chondrolectomy . bone biopsy. (Right Foot)     Patient location during evaluation: Phase II Anesthesia Type: General Level of consciousness: awake and sedated Pain management: pain level controlled Vital Signs Assessment: post-procedure vital signs reviewed and stable Respiratory status: spontaneous breathing Cardiovascular status: stable Postop Assessment: no apparent nausea or vomiting Anesthetic complications: no    Last Vitals:  Vitals:   10/16/18 0915 10/16/18 0930  BP: (!) 167/93 (!) (P) 161/105  Pulse: (!) 59 (!) 59  Resp: 12 13  Temp:  37 C  SpO2: 100% 100%    Last Pain:  Vitals:   10/16/18 0845  TempSrc:   PainSc: 0-No pain   Pain Goal:                   Huston Foley

## 2018-10-16 NOTE — Transfer of Care (Signed)
Immediate Anesthesia Transfer of Care Note  Patient: Mark Stein  Procedure(s) Performed: right great toe wound debridement and closure. arthroplasty packing antiobiotic beads. distal phlax chondrolectomy . bone biopsy. (Right Foot)  Patient Location: PACU  Anesthesia Type:General  Level of Consciousness: awake, alert  and oriented  Airway & Oxygen Therapy: Patient Spontanous Breathing and Patient connected to face mask oxygen  Post-op Assessment: Report given to RN and Post -op Vital signs reviewed and stable  Post vital signs: Reviewed and stable  Last Vitals:  Vitals Value Taken Time  BP 112/66 10/16/18 0845  Temp    Pulse 89 10/16/18 0845  Resp 13 10/16/18 0846  SpO2 100 % 10/16/18 0845  Vitals shown include unvalidated device data.  Last Pain:  Vitals:   10/16/18 0620  TempSrc:   PainSc: 0-No pain         Complications: No apparent anesthesia complications

## 2018-10-17 ENCOUNTER — Telehealth: Payer: Self-pay

## 2018-10-17 ENCOUNTER — Encounter (HOSPITAL_COMMUNITY): Payer: Self-pay | Admitting: Podiatry

## 2018-10-17 NOTE — Telephone Encounter (Signed)
POST OP CALL-    1) General condition stated by the patient: Doing well  2) Is the pt having pain? Only a little  3) Pain score:   4) Has the pt taken Rx'd pain medication, regularly or PRN?   5) Is the pain medication giving relief?  6) Any fever, chills, nausea, or vomiting, shortness of breath or tightness in calf? None  7) Is the bandage clean, dry and intact? Yes  8) Is there excessive tightness, bleeding or drainage coming through the bandage? No  9) Did you understand all of the post op instruction sheet given? Yes  10) Any questions or concerns regarding post op care/recovery? No    Confirmed POV appointment with patient

## 2018-10-22 DIAGNOSIS — L97514 Non-pressure chronic ulcer of other part of right foot with necrosis of bone: Secondary | ICD-10-CM

## 2018-10-22 DIAGNOSIS — M86171 Other acute osteomyelitis, right ankle and foot: Secondary | ICD-10-CM

## 2018-10-22 LAB — AEROBIC/ANAEROBIC CULTURE W GRAM STAIN (SURGICAL/DEEP WOUND)

## 2018-10-24 ENCOUNTER — Other Ambulatory Visit: Payer: Self-pay

## 2018-10-24 ENCOUNTER — Ambulatory Visit (INDEPENDENT_AMBULATORY_CARE_PROVIDER_SITE_OTHER): Payer: BC Managed Care – PPO | Admitting: Podiatry

## 2018-10-24 DIAGNOSIS — M869 Osteomyelitis, unspecified: Secondary | ICD-10-CM

## 2018-10-24 MED ORDER — CLINDAMYCIN HCL 300 MG PO CAPS: 300.0000 mg | ORAL_CAPSULE | Freq: Three times a day (TID) | ORAL | 0 refills | Status: DC

## 2018-10-24 MED ORDER — OXYCODONE-ACETAMINOPHEN 5-325 MG PO TABS: 1.0000 | ORAL_TABLET | ORAL | 0 refills | Status: DC | PRN

## 2018-10-31 ENCOUNTER — Other Ambulatory Visit: Payer: Self-pay

## 2018-10-31 ENCOUNTER — Ambulatory Visit (INDEPENDENT_AMBULATORY_CARE_PROVIDER_SITE_OTHER): Payer: BC Managed Care – PPO | Admitting: Podiatry

## 2018-10-31 ENCOUNTER — Encounter: Payer: Self-pay | Admitting: Podiatry

## 2018-10-31 VITALS — Temp 98.2°F

## 2018-10-31 DIAGNOSIS — Z9889 Other specified postprocedural states: Secondary | ICD-10-CM

## 2018-10-31 NOTE — Progress Notes (Signed)
Subjective:  Patient ID: Mark Stein, male    DOB: 08-03-1985,  MRN: [id number]  No chief complaint on file.   DOS: 10/16/2018 Procedure: Right great toe arthroplasty packing antiobiotic beads, distal phlax condylectomy, bone biopsy, wound closure  33 y.o. male returns for post-op check. Pain controlled denies concerns.  Having some difficulty getting around without putting much pressure on the foot.  Review of Systems: Negative except as noted in the HPI. Denies N/V/F/Ch.  Past Medical History:  Diagnosis Date  . Medical history non-contributory     Current Outpatient Medications:  .  clindamycin (CLEOCIN) 300 MG capsule, Take 1 capsule (300 mg total) by mouth 3 (three) times daily., Disp: 21 capsule, Rfl: 0 .  oxyCODONE-acetaminophen (PERCOCET) 5-325 MG tablet, Take 1 tablet by mouth every 4 (four) hours as needed for severe pain., Disp: 20 tablet, Rfl: 0  Social History   Tobacco Use  Smoking Status Former Smoker  Smokeless Tobacco Never Used    No Known Allergies Objective:  There were no vitals filed for this visit. There is no height or weight on file to calculate BMI. Constitutional Well developed. Well nourished.  Vascular Foot warm and well perfused. Capillary refill normal to all digits.   Neurologic Normal speech. Oriented to person, place, and time. Epicritic sensation to light touch grossly present bilaterally.  Dermatologic Skin healing well without signs of infection. Skin edges well coapted without signs of infection.  Orthopedic: Tenderness to palpation noted about the surgical site.   Radiographs: Taken   Recent Results (from the past 720 hour(s))  WOUND CULTURE     Status: Abnormal   Collection Time: 10/08/18  5:40 PM   Specimen: Foot, Right; Wound   WOUND CULTURE AND SENS  Result Value Ref Range Status   Gram Stain Result Final report  Final   Organism ID, Bacteria Comment  Final    Comment: No white blood cells seen.   Organism ID, Bacteria  Comment  Final    Comment: Many gram positive cocci.   Aerobic Bacterial Culture Final report (A)  Final   Organism ID, Bacteria Staphylococcus aureus (A)  Final    Comment: Light growth Most isolates of Staphylococcus sp. produce a beta-lactamase enzyme rendering them resistant to penicillin. Please contact the laboratory if penicillin is being considered for therapy.    Organism ID, Bacteria Mixed skin flora  Final    Comment: Light growth   Antimicrobial Susceptibility Comment  Final    Comment:       ** S = Susceptible; I = Intermediate; R = Resistant **                    P = Positive; N = Negative             MICS are expressed in micrograms per mL    Antibiotic                 RSLT#1    RSLT#2    RSLT#3    RSLT#4 Ciprofloxacin                  S Clindamycin                    S Erythromycin                   I Gentamicin  S Levofloxacin                   S Linezolid                      S Moxifloxacin                   S Oxacillin                      S Quinupristin/Dalfopristin      S Rifampin                       S Tetracycline                   S Trimethoprim/Sulfa             S Vancomycin                     S   SARS Coronavirus 2 (Performed in [facility] hospital lab)     Status: None   Collection Time: 10/12/18 12:03 PM   Specimen: Nasal Swab  Result Value Ref Range Status   SARS Coronavirus 2 NEGATIVE NEGATIVE Final    Comment: (NOTE) SARS-CoV-2 target nucleic acids are NOT DETECTED. The SARS-CoV-2 RNA is generally detectable in upper and lower respiratory specimens during the acute phase of infection. Negative results do not preclude SARS-CoV-2 infection, do not rule out co-infections with other pathogens, and should not be used as the sole basis for treatment or other patient management decisions. Negative results must be combined with clinical observations, patient history, and epidemiological information. The expected result is  Negative. Fact Sheet for Patients: FlowerCheck.behttps://www.fda.gov/media/136695/download Fact Sheet for Healthcare Providers: https://www.smith-hall.com/https://www.fda.gov/media/136692/download This test is not yet approved or cleared by the [place] FDA and  has been authorized for detection and/or diagnosis of SARS-CoV-2 by FDA under an Emergency Use Authorization (EUA). This EUA will remain  in effect (meaning this test can be used) for the duration of the COVID-19 declaration under Section 56 4(b)(1) of the Act, 21 U.S.C. section 360bbb-3(b)(1), unless the authorization is terminated or revoked sooner. Performed at [facility], [address] [place], [place] [address]   Aerobic/Anaerobic Culture (surgical/deep wound)     Status: None   Collection Time: 10/16/18  8:06 AM   Specimen: Bone; Tissue  Result Value Ref Range Status   Specimen Description   Final    BONE TOE RIGHT GREAT Performed at [facility], [address] [place], [place] [address]    Special Requests   Final    NONE Performed at [facility], [address] [place], [place] [address]    Gram Stain   Final    RARE WBC PRESENT,BOTH PMN AND MONONUCLEAR NO ORGANISMS SEEN    Culture   Final    RARE STAPHYLOCOCCUS AUREUS NO ANAEROBES ISOLATED CRITICAL RESULT CALLED TO, READ BACK BY AND VERIFIED WITH: DR Samuella CotaPRICE [phone] AT 1133 AM BY CM Performed at [facility], [address] [place], [place] [address]    Report Status 10/22/2018 FINAL  Final   Organism ID, Bacteria STAPHYLOCOCCUS AUREUS  Final      Susceptibility   Staphylococcus aureus - MIC*    CIPROFLOXACIN <=0.5 SENSITIVE Sensitive     ERYTHROMYCIN 4 INTERMEDIATE Intermediate     GENTAMICIN <=0.5 SENSITIVE Sensitive     OXACILLIN <=  0.25 SENSITIVE Sensitive     TETRACYCLINE <=1 SENSITIVE Sensitive     VANCOMYCIN <=0.5 SENSITIVE Sensitive     TRIMETH/SULFA <=10 SENSITIVE Sensitive     CLINDAMYCIN <=0.25 SENSITIVE Sensitive     RIFAMPIN <=0.5  SENSITIVE Sensitive     Inducible Clindamycin NEGATIVE Sensitive     * RARE STAPHYLOCOCCUS AUREUS   Assessment:   1. Osteomyelitis of right foot, unspecified type [facility](HCC)    Plan:  Patient was evaluated and treated and all questions answered.  S/p foot surgery right -Progressing as expected post-operatively. -XR: none today. -WB Status: WBAT in surgical shoe -Sutures: intact. -Medications: Refill cleocin and Percocet for pain. -Foot redressed. -Discussed positive bone culture however only rare bacteria found and no signs of overt osteomyelitis on pathology.  Would consider only about 2 to 4 weeks course of p.o. antibiotics given internal antibiotic beads left.  Will monitor clinically for longer antibiotic course.  Return in about 1 week (around 10/31/2018) for Post-op.

## 2018-11-08 ENCOUNTER — Encounter: Payer: Self-pay | Admitting: Podiatry

## 2018-11-08 ENCOUNTER — Ambulatory Visit (INDEPENDENT_AMBULATORY_CARE_PROVIDER_SITE_OTHER): Payer: BC Managed Care – PPO | Admitting: Podiatry

## 2018-11-08 ENCOUNTER — Other Ambulatory Visit: Payer: Self-pay

## 2018-11-08 DIAGNOSIS — Z9889 Other specified postprocedural states: Secondary | ICD-10-CM

## 2018-11-08 MED ORDER — SILVER SULFADIAZINE 1 % EX CREA: TOPICAL_CREAM | CUTANEOUS | 0 refills | Status: DC

## 2018-11-08 MED ORDER — OXYCODONE-ACETAMINOPHEN 5-325 MG PO TABS: 1.0000 | ORAL_TABLET | ORAL | 0 refills | Status: DC | PRN

## 2018-11-10 NOTE — Op Note (Addendum)
Patient Name: Mark Stein DOB: [birth year]  MRN: [id number]   Date of Service: 10/16/2018  Surgeon: Dr. Hardie Pulley, DPM Assistants: None Pre-operative Diagnosis:  Ulcer right great toe, osteomyelitis Post-operative Diagnosis:  same Procedures:  1) Right great toe wound incision and drainage  2) Right great toe phalangectomy  2) Right distal phalanx condylectomy  3) Right superficial Bone biopsy  4) Right foot wound closure Pathology/Specimens: ID Type Source Tests Collected by Time Destination  1 : right great toe bone Tissue Bone SURGICAL PATHOLOGY Evelina Bucy, DPM 10/16/2018 7124   A : bone right great toe aerobic anaerobic culture Tissue Bone AEROBIC/ANAEROBIC CULTURE (SURGICAL/DEEP WOUND) Evelina Bucy, DPM 10/16/2018 0806    Anesthesia: MAC local Hemostasis:  Total Tourniquet Time Documented: area (laterality) - 54 minutes Total: area (laterality) - 54 minutes  Estimated Blood Loss: 2 mL Materials:  Implant Name Type Inv. Item Serial No. Manufacturer Lot No. LRB No. Used Action  KIT STIMULAN RAPID CURE  10CC - [id number] Orthopedic Implant KIT STIMULAN RAPID CURE  10CC  [employer] [id number]-[id number] Right 1 Implanted   Medications: Vancomycin powder Complications: None  Indications for Procedure:  This is a 33 y.o. male with a wound to the right foot that probes to bone.  It was discussed the patient that due to bone infection normal treatment would be amputation however we discussed possible salvage attempt via resection of the infected bone and application of antibiotic beads with wound closure.  Patient wished for salvage approach and agreed to proceed.   Procedure in Detail: Patient was identified in pre-operative holding area. Formal consent was signed and the right lower extremity was marked. Patient was brought back to the operating room. Anesthesia was induced. The extremity was prepped and draped in the usual sterile fashion. Timeout was taken to confirm  patient name, laterality, and procedure prior to incision.   Attention was then directed to the right great toe.  A linear incision was made at the medial aspect the right great toe.  Dissection was carried down through skin subcu tissue with care to avoid all vital neurovascular structures all bleeders were cauterized electrocautery.  Dissection was carried on level of the interphalangeal joint capsule.  A linear incision was made in the joint capsule.  The joint capsule was gently freed from the bone.  Once adequately exposed the proximal phalangeal head was excised with a sagittal saw.  Fluoroscopy was used to confirm resection.  There appeared to be a small sesamoid bone which was sharply excised.  Attention was then directed to the distal phalanx for a portion of the medial condyle which was felt to be contributing to ulceration was rongeured and smoothed with a power rasp.  The area was then copiously irrigated with normal saline.  Stimulan antibiotic beads that have been impregnated with vancomycin powder were packed into the arthroplasty site.  The interphalangeal joint capsule was then repaired.  The skin was then closed in layers with 3-0 Vicryl 4-0 nylon and skin staples.  Attention was then directed to the plantar aspect of the toe where there was a wound measuring approximately 3 x 1.  The wound was sharply excisionally debrided of all nonviable tissue.  The wound base was sharply excised.  Debridement was carried down to joint capsule.  The skin edges were gently undermined.  The skin was then primarily closed in layers with 3-0 Vicryl and 4-0 nylon and skin staples.  The foot was then dressed with Xeroform 4 x 4  Kerlix and Ace bandage. Patient tolerated the procedure well.   Disposition: Following a period of post-operative monitoring, patient will be transferred home.

## 2018-11-19 ENCOUNTER — Telehealth: Payer: Self-pay | Admitting: Podiatry

## 2018-11-19 NOTE — Telephone Encounter (Signed)
Pt called and cancelled his POV for this Friday due to having flu like symptoms. Pt stated he would call back to reschedule once he starts feeling better. Pt also started having some stomach pain/constipation and didn't know if it could be a side effect from the Percocet. Please give patient a call.

## 2018-11-19 NOTE — Telephone Encounter (Signed)
I called pt and informed the decrease in activity and the pain medication slows down the motion of the intestines and he should increase juice, fruit and vegetables and use OTC Colace as directed.

## 2018-11-22 ENCOUNTER — Encounter: Payer: BC Managed Care – PPO | Admitting: Podiatry

## 2018-11-25 NOTE — Progress Notes (Signed)
  Subjective:  Patient ID: Mark Stein, male    DOB: Jun 06, 1985,  MRN: [id number]  Chief Complaint  Patient presents with  . Routine Post Op    " my toes hurt pretty badly, it is hard for me to walk on them"     DOS: 10/16/2018 Procedure: Right great toe arthroplasty packing antiobiotic beads, distal phlax condylectomy, bone biopsy, wound closure  33 y.o. male returns for post-op check. Hx as above.  Review of Systems: Negative except as noted in the HPI. Denies N/V/F/Ch.  Past Medical History:  Diagnosis Date  . Medical history non-contributory     Current Outpatient Medications:  .  clindamycin (CLEOCIN) 300 MG capsule, Take 1 capsule (300 mg total) by mouth 3 (three) times daily., Disp: 21 capsule, Rfl: 0 .  oxyCODONE-acetaminophen (PERCOCET) 5-325 MG tablet, Take 1 tablet by mouth every 4 (four) hours as needed for severe pain., Disp: 20 tablet, Rfl: 0 .  silver sulfADIAZINE (SILVADENE) 1 % cream, Apply pea-sized amount to wound daily., Disp: 50 g, Rfl: 0  Social History   Tobacco Use  Smoking Status Former Smoker  Smokeless Tobacco Never Used    No Known Allergies Objective:   Vitals:   10/31/18 1612  Temp: 98.2 F (36.8 C)   There is no height or weight on file to calculate BMI. Constitutional Well developed. Well nourished.  Vascular Foot warm and well perfused. Capillary refill normal to all digits.   Neurologic Normal speech. Oriented to person, place, and time. Epicritic sensation to light touch grossly present bilaterally.  Dermatologic Skin healing well without signs of infection. Skin edges well coapted without signs of infection.  Orthopedic: Tenderness to palpation noted about the surgical site.   Radiographs: Taken   No results found for this or any previous visit (from the past 720 hour(s)). Assessment:   1. Post-operative state    Plan:  Patient was evaluated and treated and all questions answered.  S/p foot surgery right -Progressing  as expected post-operatively. -XR: none today. -WB Status: WBAT in surgical shoe -Sutures: intact. -Foot redressed. -Continue abx to completion.  Return in about 1 week (around 11/07/2018).

## 2018-11-25 NOTE — Progress Notes (Signed)
  Subjective:  Patient ID: Mark Stein, male    DOB: [birth year],  MRN: [id number]  Chief Complaint  Patient presents with  . Wound Check    " my feet still hurt quite a bit"     DOS: 10/16/2018 Procedure: Right great toe arthroplasty packing antiobiotic beads, distal phlax condylectomy, bone biopsy, wound closure  33 y.o. male returns for post-op check. Hx as above.  Review of Systems: Negative except as noted in the HPI. Denies N/V/F/Ch.  Past Medical History:  Diagnosis Date  . Medical history non-contributory     Current Outpatient Medications:  .  clindamycin (CLEOCIN) 300 MG capsule, Take 1 capsule (300 mg total) by mouth 3 (three) times daily., Disp: 21 capsule, Rfl: 0 .  oxyCODONE-acetaminophen (PERCOCET) 5-325 MG tablet, Take 1 tablet by mouth every 4 (four) hours as needed for severe pain., Disp: 20 tablet, Rfl: 0 .  silver sulfADIAZINE (SILVADENE) 1 % cream, Apply pea-sized amount to wound daily., Disp: 50 g, Rfl: 0  Social History   Tobacco Use  Smoking Status Former Smoker  Smokeless Tobacco Never Used    No Known Allergies Objective:   There were no vitals filed for this visit. There is no height or weight on file to calculate BMI. Constitutional Well developed. Well nourished.  Vascular Foot warm and well perfused. Capillary refill normal to all digits.   Neurologic Normal speech. Oriented to person, place, and time. Epicritic sensation to light touch grossly present bilaterally.  Dermatologic Skin healing well without signs of infection. Slight callus formation noted with good healed skin underneath. Left hallux ulcer with granular base  Orthopedic: Tenderness to palpation noted about the surgical site.   Radiographs: Taken   No results found for this or any previous visit (from the past 720 hour(s)). Assessment:   1. Post-operative state    Plan:  Patient was evaluated and treated and all questions answered.  S/p foot surgery right  -Progressing as expected post-operatively. -XR: none today. -WB Status: WBAT in surgical shoe -Sutures: removed. No open ulcer noted. -Foot redressed. -Refill pain medication. -Rx silvadene to soften callused area and to apply to left hallux.  Return in about 2 weeks (around 11/22/2018) for Post-op.

## 2018-12-12 ENCOUNTER — Ambulatory Visit (INDEPENDENT_AMBULATORY_CARE_PROVIDER_SITE_OTHER): Payer: BC Managed Care – PPO | Admitting: Podiatry

## 2018-12-12 ENCOUNTER — Other Ambulatory Visit: Payer: Self-pay

## 2018-12-12 ENCOUNTER — Encounter: Payer: Self-pay | Admitting: Podiatry

## 2018-12-12 DIAGNOSIS — Z9889 Other specified postprocedural states: Secondary | ICD-10-CM

## 2018-12-12 MED ORDER — HYDROCODONE-ACETAMINOPHEN 5-325 MG PO TABS: 1.0000 | ORAL_TABLET | ORAL | 0 refills | Status: DC | PRN

## 2018-12-24 ENCOUNTER — Ambulatory Visit: Payer: BC Managed Care – PPO | Admitting: Orthotics

## 2018-12-24 ENCOUNTER — Other Ambulatory Visit: Payer: Self-pay

## 2018-12-24 NOTE — Progress Notes (Signed)
Patient chose to wait until insurance is verified as he doesn't want to incurr financial responsibility if not covered or deductible not met.

## 2019-01-16 ENCOUNTER — Other Ambulatory Visit: Payer: BC Managed Care – PPO | Admitting: Orthotics

## 2019-01-17 ENCOUNTER — Ambulatory Visit (INDEPENDENT_AMBULATORY_CARE_PROVIDER_SITE_OTHER): Payer: BC Managed Care – PPO | Admitting: Podiatry

## 2019-01-17 ENCOUNTER — Encounter: Payer: Self-pay | Admitting: Podiatry

## 2019-01-17 ENCOUNTER — Other Ambulatory Visit: Payer: Self-pay

## 2019-01-17 VITALS — Temp 98.4°F

## 2019-01-17 DIAGNOSIS — L97522 Non-pressure chronic ulcer of other part of left foot with fat layer exposed: Secondary | ICD-10-CM

## 2019-01-17 DIAGNOSIS — L97511 Non-pressure chronic ulcer of other part of right foot limited to breakdown of skin: Secondary | ICD-10-CM

## 2019-01-17 NOTE — Progress Notes (Signed)
  Subjective:  Patient ID: Mark Stein, male    DOB: 1985-12-15,  MRN: [id number]  Chief Complaint  Patient presents with  . Wound Check    DOS 6.24.2020. Pt states right 1st digit is doing good, left 1st has drainage/odor. Pt denies fever/nausea/vomiting/chills.    DOS: 10/16/2018 Procedure: Right great toe arthroplasty packing antiobiotic beads, distal phlax condylectomy, bone biopsy, wound closure  33 y.o. male returns for post-op check. Hx as above.   Review of Systems: Negative except as noted in the HPI. Denies N/V/F/Ch.  Past Medical History:  Diagnosis Date  . Medical history non-contributory     Current Outpatient Medications:  .  HYDROcodone-acetaminophen (NORCO/VICODIN) 5-325 MG tablet, Take 1 tablet by mouth every 4 (four) hours as needed for moderate pain., Disp: 30 tablet, Rfl: 0 .  methocarbamol (ROBAXIN) 500 MG tablet, methocarbamol 500 mg tablet, Disp: , Rfl:  .  silver sulfADIAZINE (SILVADENE) 1 % cream, Apply pea-sized amount to wound daily., Disp: 50 g, Rfl: 0 .  clindamycin (CLEOCIN) 300 MG capsule, Take 1 capsule (300 mg total) by mouth 3 (three) times daily. (Patient not taking: Reported on 01/17/2019), Disp: 21 capsule, Rfl: 0 .  oxyCODONE-acetaminophen (PERCOCET) 5-325 MG tablet, Take 1 tablet by mouth every 4 (four) hours as needed for severe pain. (Patient not taking: Reported on 01/17/2019), Disp: 20 tablet, Rfl: 0  Social History   Tobacco Use  Smoking Status Former Smoker  Smokeless Tobacco Never Used    No Known Allergies Objective:   Vitals:   01/17/19 0810  Temp: 98.4 F (36.9 C)   There is no height or weight on file to calculate BMI. Constitutional Well developed. Well nourished.  Vascular Foot warm and well perfused. Capillary refill normal to all digits.   Neurologic Normal speech. Oriented to person, place, and time. Epicritic sensation to light touch grossly present bilaterally.  Dermatologic Skin healing well without signs of  infection. Slight callus formation noted with good healed skin underneath. Left hallux ulcer with granular base measuring 1x3 post debridement. Malodorous but no warmth erythema signs of acute infeciton.  Orthopedic: Tenderness to palpation noted about the surgical site.   Radiographs: None  No results found for this or any previous visit (from the past 720 hour(s)). Assessment:   1. Skin ulcer of right foot, limited to breakdown of skin ([place])   2. Skin ulcer of left foot with fat layer exposed ([place])    Plan:  Patient was evaluated and treated and all questions answered.  S/p foot surgery right -Well healed  Left Hallux Ulcer -Debrided as below -Advised to soak the area to promote drying out -Discussed he will likely need partial resection and wound closure of the left hallux. Plan for procedure at a later date.  Procedure: Excisional Debridement of Wound Rationale: Removal of non-viable soft tissue from the wound to promote healing.  Anesthesia: none Pre-Debridement Wound Measurements: 1 cm x 2.5 cm x 0.5 cm  Post-Debridement Wound Measurements: 1 cm x 3 cm x 0.5 cm  Type of Debridement: Sharp Excisional Tissue Removed: Non-viable soft tissue Depth of Debridement: subcutaneous tissue. Technique: Sharp excisional debridement to bleeding, viable wound base.  Dressing: Dry, sterile, compression dressing. Disposition: Patient tolerated procedure well. Patient to return in 1 week for follow-up.     No follow-ups on file.

## 2019-01-17 NOTE — H&P (View-Only) (Signed)
  Subjective:  Patient ID: Mark Stein, male    DOB: 10/19/1985,  MRN: [id number]  Chief Complaint  Patient presents with  . Wound Check    DOS 6.24.2020. Pt states right 1st digit is doing good, left 1st has drainage/odor. Pt denies fever/nausea/vomiting/chills.    DOS: 10/16/2018 Procedure: Right great toe arthroplasty packing antiobiotic beads, distal phlax condylectomy, bone biopsy, wound closure  33 y.o. male returns for post-op check. Hx as above.   Review of Systems: Negative except as noted in the HPI. Denies N/V/F/Ch.  Past Medical History:  Diagnosis Date  . Medical history non-contributory     Current Outpatient Medications:  .  HYDROcodone-acetaminophen (NORCO/VICODIN) 5-325 MG tablet, Take 1 tablet by mouth every 4 (four) hours as needed for moderate pain., Disp: 30 tablet, Rfl: 0 .  methocarbamol (ROBAXIN) 500 MG tablet, methocarbamol 500 mg tablet, Disp: , Rfl:  .  silver sulfADIAZINE (SILVADENE) 1 % cream, Apply pea-sized amount to wound daily., Disp: 50 g, Rfl: 0 .  clindamycin (CLEOCIN) 300 MG capsule, Take 1 capsule (300 mg total) by mouth 3 (three) times daily. (Patient not taking: Reported on 01/17/2019), Disp: 21 capsule, Rfl: 0 .  oxyCODONE-acetaminophen (PERCOCET) 5-325 MG tablet, Take 1 tablet by mouth every 4 (four) hours as needed for severe pain. (Patient not taking: Reported on 01/17/2019), Disp: 20 tablet, Rfl: 0  Social History   Tobacco Use  Smoking Status Former Smoker  Smokeless Tobacco Never Used    No Known Allergies Objective:   Vitals:   01/17/19 0810  Temp: 98.4 F (36.9 C)   There is no height or weight on file to calculate BMI. Constitutional Well developed. Well nourished.  Vascular Foot warm and well perfused. Capillary refill normal to all digits.   Neurologic Normal speech. Oriented to person, place, and time. Epicritic sensation to light touch grossly present bilaterally.  Dermatologic Skin healing well without signs of  infection. Slight callus formation noted with good healed skin underneath. Left hallux ulcer with granular base measuring 1x3 post debridement. Malodorous but no warmth erythema signs of acute infeciton.  Orthopedic: Tenderness to palpation noted about the surgical site.   Radiographs: None  No results found for this or any previous visit (from the past 720 hour(s)). Assessment:   1. Skin ulcer of right foot, limited to breakdown of skin ([facility])   2. Skin ulcer of left foot with fat layer exposed ([facility])    Plan:  Patient was evaluated and treated and all questions answered.  S/p foot surgery right -Well healed  Left Hallux Ulcer -Debrided as below -Advised to soak the area to promote drying out -Discussed he will likely need partial resection and wound closure of the left hallux. Plan for procedure at a later date.  Procedure: Excisional Debridement of Wound Rationale: Removal of non-viable soft tissue from the wound to promote healing.  Anesthesia: none Pre-Debridement Wound Measurements: 1 cm x 2.5 cm x 0.5 cm  Post-Debridement Wound Measurements: 1 cm x 3 cm x 0.5 cm  Type of Debridement: Sharp Excisional Tissue Removed: Non-viable soft tissue Depth of Debridement: subcutaneous tissue. Technique: Sharp excisional debridement to bleeding, viable wound base.  Dressing: Dry, sterile, compression dressing. Disposition: Patient tolerated procedure well. Patient to return in 1 week for follow-up.     No follow-ups on file.   

## 2019-01-19 NOTE — Progress Notes (Signed)
  Subjective:  Patient ID: Mark Stein, male    DOB: [birth year],  MRN: [id number]  Chief Complaint  Patient presents with  . Routine Post Op    pov 10/16/2018 AMPUTATION TOE INTERPHALANGEAL HALLUX OR PARTIAL PHALANX EXCISION, INSERTION ANTIBIOTIC BEADS, AND INCISION BONE CORTEX OSTEO R    DOS: 10/16/2018 Procedure: Right great toe arthroplasty packing antiobiotic beads, distal phlax condylectomy, bone biopsy, wound closure  33 y.o. male returns for post-op check. Hx as above.  Review of Systems: Negative except as noted in the HPI. Denies N/V/F/Ch.  Past Medical History:  Diagnosis Date  . Medical history non-contributory     Current Outpatient Medications:  .  clindamycin (CLEOCIN) 300 MG capsule, Take 1 capsule (300 mg total) by mouth 3 (three) times daily. (Patient not taking: Reported on 01/17/2019), Disp: 21 capsule, Rfl: 0 .  HYDROcodone-acetaminophen (NORCO/VICODIN) 5-325 MG tablet, Take 1 tablet by mouth every 4 (four) hours as needed for moderate pain., Disp: 30 tablet, Rfl: 0 .  methocarbamol (ROBAXIN) 500 MG tablet, methocarbamol 500 mg tablet, Disp: , Rfl:  .  oxyCODONE-acetaminophen (PERCOCET) 5-325 MG tablet, Take 1 tablet by mouth every 4 (four) hours as needed for severe pain. (Patient not taking: Reported on 01/17/2019), Disp: 20 tablet, Rfl: 0 .  silver sulfADIAZINE (SILVADENE) 1 % cream, Apply pea-sized amount to wound daily., Disp: 50 g, Rfl: 0  Social History   Tobacco Use  Smoking Status Former Smoker  Smokeless Tobacco Never Used    No Known Allergies Objective:   There were no vitals filed for this visit. There is no height or weight on file to calculate BMI. Constitutional Well developed. Well nourished.  Vascular Foot warm and well perfused. Capillary refill normal to all digits.   Neurologic Normal speech. Oriented to person, place, and time. Epicritic sensation to light touch grossly present bilaterally.  Dermatologic Skin well healed  right. Left hallux ulcer without warmth erythema signs of acute infection.  Orthopedic: Tenderness to palpation noted about the surgical site. Decreased DF bilat L>R   Radiographs: None  Assessment:   1. Post-operative state    Plan:  Patient was evaluated and treated and all questions answered.  S/p foot surgery right -Progressing as expected post-operatively. -Skin healed right -Transition back to work. -Refill norco  Dropfoot -Recommend dropfoot brace will make appt for fabrication  Return in about 1 month (around 01/12/2019) for post-op check.

## 2019-01-30 ENCOUNTER — Telehealth: Payer: Self-pay | Admitting: *Deleted

## 2019-01-30 ENCOUNTER — Other Ambulatory Visit: Payer: Self-pay

## 2019-01-30 ENCOUNTER — Ambulatory Visit (INDEPENDENT_AMBULATORY_CARE_PROVIDER_SITE_OTHER): Payer: BC Managed Care – PPO | Admitting: Podiatry

## 2019-01-30 ENCOUNTER — Encounter: Payer: Self-pay | Admitting: Podiatry

## 2019-01-30 DIAGNOSIS — L97511 Non-pressure chronic ulcer of other part of right foot limited to breakdown of skin: Secondary | ICD-10-CM | POA: Diagnosis not present

## 2019-01-30 DIAGNOSIS — L97522 Non-pressure chronic ulcer of other part of left foot with fat layer exposed: Secondary | ICD-10-CM | POA: Diagnosis not present

## 2019-01-30 DIAGNOSIS — M2042 Other hammer toe(s) (acquired), left foot: Secondary | ICD-10-CM | POA: Diagnosis not present

## 2019-01-30 MED ORDER — SANTYL 250 UNIT/GM EX OINT: 1.0000 "application " | TOPICAL_OINTMENT | Freq: Every day | CUTANEOUS | 5 refills | Status: DC

## 2019-01-30 NOTE — Patient Instructions (Signed)
Pre-Operative Instructions  Congratulations, you have decided to take an important step towards improving your quality of life.  You can be assured that the doctors and staff at [facility] will be with you every step of the way.  Here are some important things you should know:  1. Plan to be at the surgery center/hospital at least 1 (one) hour prior to your scheduled time, unless otherwise directed by the surgical center/hospital staff.  You must have a responsible adult accompany you, remain during the surgery and drive you home.  Make sure you have directions to the surgical center/hospital to ensure you arrive on time. 2. If you are having surgery at [facility] or [facility] hospitals, you will need a copy of your medical history and physical form from your family physician within one month prior to the date of surgery. We will give you a form for your primary physician to complete.  3. We make every effort to accommodate the date you request for surgery.  However, there are times where surgery dates or times have to be moved.  We will contact you as soon as possible if a change in schedule is required.   4. No aspirin/ibuprofen for one week before surgery.  If you are on aspirin, any non-steroidal anti-inflammatory medications (Mobic, Aleve, Ibuprofen) should not be taken seven (7) days prior to your surgery.  You make take Tylenol for pain prior to surgery.  5. Medications - If you are taking daily heart and blood pressure medications, seizure, reflux, allergy, asthma, anxiety, pain or diabetes medications, make sure you notify the surgery center/hospital before the day of surgery so they can tell you which medications you should take or avoid the day of surgery. 6. No food or drink after midnight the night before surgery unless directed otherwise by surgical center/hospital staff. 7. No alcoholic beverages 24-hours prior to surgery.  No smoking 24-hours prior or 24-hours after  surgery. 8. Wear loose pants or shorts. They should be loose enough to fit over bandages, boots, and casts. 9. Don't wear slip-on shoes. Sneakers are preferred. 10. Bring your boot with you to the surgery center/hospital.  Also bring crutches or a walker if your physician has prescribed it for you.  If you do not have this equipment, it will be provided for you after surgery. 11. If you have not been contacted by the surgery center/hospital by the day before your surgery, call to confirm the date and time of your surgery. 12. Leave-time from work may vary depending on the type of surgery you have.  Appropriate arrangements should be made prior to surgery with your employer. 13. Prescriptions will be provided immediately following surgery by your doctor.  Fill these as soon as possible after surgery and take the medication as directed. Pain medications will not be refilled on weekends and must be approved by the doctor. 14. Remove nail polish on the operative foot and avoid getting pedicures prior to surgery. 15. Wash the night before surgery.  The night before surgery wash the foot and leg well with water and the antibacterial soap provided. Be sure to pay special attention to beneath the toenails and in between the toes.  Wash for at least three (3) minutes. Rinse thoroughly with water and dry well with a towel.  Perform this wash unless told not to do so by your physician.  Enclosed: 1 Ice pack (please put in freezer the night before surgery)   1 Hibiclens skin cleaner     Pre-op instructions  If you have any questions regarding the instructions, please do not hesitate to call our office.  [place]: [address], [place], [place] [address] -- [phone]  [place]: [address] [place], [place] [address] -- [phone]  [place]: [address]  [place], [place] [address] -- [phone]   Website: [email or web address] 

## 2019-01-30 NOTE — Telephone Encounter (Signed)
Dr. March Rummage ordered Santyl for left 1st toe ulcer 2.0 x 2.0 x 0.1cm. I spoke with pt and informed the Santyl required pre-cert through [facility] or [employer].

## 2019-01-31 ENCOUNTER — Telehealth: Payer: Self-pay | Admitting: *Deleted

## 2019-01-31 DIAGNOSIS — Z01812 Encounter for preprocedural laboratory examination: Secondary | ICD-10-CM

## 2019-01-31 NOTE — Telephone Encounter (Signed)
I am calling to let you know we have you scheduled for surgery on February 05, 2019 at [facility] at 10 am.  Mark Stein will probably need to arrive two hours prior to that time.  You should get a call from a pre-op nurse.  She'll give you your exact arrival time.  You're aware that you must have a Covid test done this weekend, correct?  "Yes, I am aware.  I haven't heard anything from anyone to schedule it though."  You should get a call soon.  Have you seen your primary care physician within the past 30 days?  "I'm scheduled to see him on Monday."

## 2019-01-31 NOTE — Telephone Encounter (Signed)
DOS 02/05/2019 ADJACENT TISSUE TRANSFER - 14040, HAMMER TOE REPAIR HALLUX LT - 25498, AND EXCISION BENIGN LESION 2.1-3.0 CM - 11423  BCBS Eligibility Date - 04/24/2018 - 04/23/9998   In-Network    Max Per Benefit Period Year-to-Date Remaining  CoInsurance     Deductible  $5,000.00 $0.00  Out-Of-Pocket 3  $7,150.00 $0.00   In-Network  [employer] Coinsurance Authorization Required  Not Applicable  26%  Yes   Benefit: 01/23/2019 - 04/23/9998  Utilization Management Organization: UTILIZATION MANAGEMENT Telephone: [phone]    Spoke to Agnes Lawrence.: Ref. # [id number] Pre-certification is NOT REQUIRED

## 2019-02-01 ENCOUNTER — Other Ambulatory Visit (HOSPITAL_COMMUNITY)
Admission: RE | Admit: 2019-02-01 | Discharge: 2019-02-01 | Disposition: A | Discharge status: Home / Self Care | Payer: BC Managed Care – PPO | Source: Ambulatory Visit | Attending: Podiatry | Admitting: Podiatry

## 2019-02-01 DIAGNOSIS — Z20828 Contact with and (suspected) exposure to other viral communicable diseases: Secondary | ICD-10-CM | POA: Diagnosis not present

## 2019-02-01 DIAGNOSIS — Z01812 Encounter for preprocedural laboratory examination: Secondary | ICD-10-CM | POA: Insufficient documentation

## 2019-02-02 LAB — NOVEL CORONAVIRUS, NAA (HOSP ORDER, SEND-OUT TO REF LAB; TAT 18-24 HRS): SARS-CoV-2, NAA: NOT DETECTED

## 2019-02-03 DIAGNOSIS — S91102A Unspecified open wound of left great toe without damage to nail, initial encounter: Secondary | ICD-10-CM | POA: Diagnosis not present

## 2019-02-03 DIAGNOSIS — Z6835 Body mass index (BMI) 35.0-35.9, adult: Secondary | ICD-10-CM | POA: Diagnosis not present

## 2019-02-03 DIAGNOSIS — Z01818 Encounter for other preprocedural examination: Secondary | ICD-10-CM | POA: Diagnosis not present

## 2019-02-04 ENCOUNTER — Encounter (HOSPITAL_COMMUNITY): Payer: Self-pay

## 2019-02-04 ENCOUNTER — Other Ambulatory Visit: Payer: Self-pay

## 2019-02-04 ENCOUNTER — Encounter (HOSPITAL_COMMUNITY)
Admission: RE | Admit: 2019-02-04 | Discharge: 2019-02-04 | Disposition: A | Discharge status: Home / Self Care | Payer: BC Managed Care – PPO | Source: Ambulatory Visit | Attending: Podiatry | Admitting: Podiatry

## 2019-02-04 DIAGNOSIS — Z01812 Encounter for preprocedural laboratory examination: Secondary | ICD-10-CM | POA: Diagnosis not present

## 2019-02-04 DIAGNOSIS — L03032 Cellulitis of left toe: Secondary | ICD-10-CM | POA: Insufficient documentation

## 2019-02-04 LAB — CBC
HCT: 45.8 % (ref 39.0–52.0)
Hemoglobin: 14.7 g/dL (ref 13.0–17.0)
MCH: 29.9 pg (ref 26.0–34.0)
MCHC: 32.1 g/dL (ref 30.0–36.0)
MCV: 93.3 fL (ref 80.0–100.0)
Platelets: 304 10*3/uL (ref 150–400)
RBC: 4.91 MIL/uL (ref 4.22–5.81)
RDW: 12.7 % (ref 11.5–15.5)
WBC: 10.1 10*3/uL (ref 4.0–10.5)
nRBC: 0 % (ref 0.0–0.2)

## 2019-02-04 LAB — SURGICAL PCR SCREEN
MRSA, PCR: NEGATIVE
Staphylococcus aureus: NEGATIVE

## 2019-02-04 NOTE — Progress Notes (Signed)
[facility] Cardiologist - none  Chest x-ray - none EKG - none Stress Test - none ECHO - none Cardiac Cath - none  Sleep Study - none CPAP - none  Fasting Blood Sugar - none Checks Blood Sugar _____ times a day  Blood Thinner Instructions:none Aspirin Instructions:none Last Dose:  Anesthesia review:   Patient denies shortness of breath, fever, cough and chest pain at PAT appointment   Patient verbalized understanding of instructions that were given to them at the PAT appointment. Patient was also instructed that they will need to review over the PAT instructions again at home before surgery.

## 2019-02-04 NOTE — Patient Instructions (Addendum)
DUE TO COVID-19 ONLY ONE VISITOR IS ALLOWED TO COME WITH YOU AND STAY IN THE WAITING ROOM ONLY DURING PRE OP AND PROCEDURE DAY OF SURGERY. THE 1 VISITOR MAY VISIT WITH YOU AFTER SURGERY IN YOUR PRIVATE ROOM DURING VISITING HOURS ONLY!  YOU NEED TO HAVE A COVID 19 TEST ON_______ @_______ , THIS TEST MUST BE DONE BEFORE SURGERY, COME  [place] , [address].  ([place]) ONCE YOUR COVID TEST IS COMPLETED, PLEASE BEGIN THE QUARANTINE INSTRUCTIONS AS OUTLINED IN YOUR HANDOUT.                Mark Stein    Your procedure is scheduled on: 02-05-19    Report to [facility] Main  Entrance    Report to Admitting at 8:00 AM     Call this number if you have problems the morning of surgery [phone]    Remember: NO SOLID FOOD AFTER MIDNIGHT THE NIGHT PRIOR TO SURGERY. NOTHING BY MOUTH EXCEPT CLEAR LIQUIDS UNTIL 7:30  AM . PLEASE FINISH ENSURE DRINK PER SURGEON ORDER  WHICH NEEDS TO BE COMPLETED AT 7:30 AM .   CLEAR LIQUID DIET   Foods Allowed                                                                     Foods Excluded  Coffee and tea, regular and decaf                             liquids that you cannot  Plain Jell-O any favor except red or purple                                           see through such as: Fruit ices (not with fruit pulp)                                     milk, soups, orange juice  Iced Popsicles                                    All solid food Carbonated beverages, regular and diet                                    Cranberry, grape and apple juices Sports drinks like Gatorade Lightly seasoned clear broth or consume(fat free) Sugar, honey syrup  Sample Menu Breakfast                                Lunch                                     Supper Cranberry juice  Beef broth                            Chicken broth Jell-O                                     Grape juice                            Apple juice Coffee or tea                        Jell-O                                      Popsicle                                                Coffee or tea                        Coffee or tea  _____________________________________________________________________       Take these medicines the morning of surgery with A SIP OF WATER: None  BRUSH YOUR TEETH MORNING OF SURGERY AND RINSE YOUR MOUTH OUT, NO CHEWING GUM CANDY OR MINTS.                                 You may not have any metal on your body including hair pins and              piercings     Do not wear jewelry, cologne, lotions, powders or deodorant                        Men may shave face and neck.   Do not bring valuables to the hospital. [facility] IS NOT             RESPONSIBLE   FOR VALUABLES.  Contacts, dentures or bridgework may not be worn into surgery.  Leave suitcase in the car. After surgery it may be brought to your room.     Patients discharged the day of surgery will not be allowed to drive home. IF YOU ARE HAVING SURGERY AND GOING HOME THE SAME DAY, YOU MUST HAVE AN ADULT TO DRIVE YOU HOME AND BE WITH YOU FOR 24 HOURS. YOU MAY GO HOME BY TAXI OR UBER OR ORTHERWISE, BUT AN ADULT MUST ACCOMPANY YOU HOME AND STAY WITH YOU FOR 24 HOURS.  Name and phone number of your driver:  Sherian Rein [phone]  Special Instructions: N/A              Please read over the following fact sheets you were given: _____________________________________________________________________             [facility] - Preparing for Surgery Before surgery, you can play an important role.  Because skin is not sterile, your skin needs to be as free of germs as possible.  You can reduce the number of germs on your skin by  washing with CHG (chlorahexidine gluconate) soap before surgery.  CHG is an antiseptic cleaner which kills germs and bonds with the skin to continue killing germs even after washing. Please DO NOT  use if you have an allergy to CHG or antibacterial soaps.  If your skin becomes reddened/irritated stop using the CHG and inform your nurse when you arrive at Short Stay. Do not shave (including legs and underarms) for at least 48 hours prior to the first CHG shower.  You may shave your face/neck. Please follow these instructions carefully:  1.  Shower with CHG Soap the night before surgery and the  morning of Surgery.  2.  If you choose to wash your hair, wash your hair first as usual with your  normal  shampoo.  3.  After you shampoo, rinse your hair and body thoroughly to remove the  shampoo.                           4.  Use CHG as you would any other liquid soap.  You can apply chg directly  to the skin and wash                       Gently with a scrungie or clean washcloth.  5.  Apply the CHG Soap to your body ONLY FROM THE NECK DOWN.   Do not use on face/ open                           Wound or open sores. Avoid contact with eyes, ears mouth and genitals (private parts).                       Wash face,  Genitals (private parts) with your normal soap.             6.  Wash thoroughly, paying special attention to the area where your surgery  will be performed.  7.  Thoroughly rinse your body with warm water from the neck down.  8.  DO NOT shower/wash with your normal soap after using and rinsing off  the CHG Soap.                9.  Pat yourself dry with a clean towel.            10.  Wear clean pajamas.            11.  Place clean sheets on your bed the night of your first shower and do not  sleep with pets. Day of Surgery : Do not apply any lotions/deodorants the morning of surgery.  Please wear clean clothes to the hospital/surgery center.  FAILURE TO FOLLOW THESE INSTRUCTIONS MAY RESULT IN THE CANCELLATION OF YOUR SURGERY PATIENT SIGNATURE_________________________________  NURSE  SIGNATURE__________________________________  ________________________________________________________________________

## 2019-02-05 ENCOUNTER — Ambulatory Visit (HOSPITAL_COMMUNITY): Payer: BC Managed Care – PPO | Admitting: Physician Assistant

## 2019-02-05 ENCOUNTER — Other Ambulatory Visit: Payer: Self-pay

## 2019-02-05 ENCOUNTER — Encounter (HOSPITAL_COMMUNITY)
Admission: RE | Disposition: A | Discharge status: Home / Self Care | Payer: Self-pay | Source: Home / Self Care | Attending: Podiatry

## 2019-02-05 ENCOUNTER — Encounter (HOSPITAL_COMMUNITY): Payer: Self-pay

## 2019-02-05 ENCOUNTER — Ambulatory Visit (HOSPITAL_COMMUNITY)
Admission: RE | Admit: 2019-02-05 | Discharge: 2019-02-05 | Disposition: A | Discharge status: Home / Self Care | Payer: BC Managed Care – PPO | Attending: Podiatry | Admitting: Podiatry

## 2019-02-05 ENCOUNTER — Ambulatory Visit (HOSPITAL_COMMUNITY): Payer: BC Managed Care – PPO

## 2019-02-05 ENCOUNTER — Telehealth (HOSPITAL_COMMUNITY): Payer: Self-pay | Admitting: *Deleted

## 2019-02-05 ENCOUNTER — Telehealth: Payer: Self-pay | Admitting: Podiatry

## 2019-02-05 DIAGNOSIS — M899 Disorder of bone, unspecified: Secondary | ICD-10-CM | POA: Diagnosis not present

## 2019-02-05 DIAGNOSIS — L97511 Non-pressure chronic ulcer of other part of right foot limited to breakdown of skin: Secondary | ICD-10-CM | POA: Insufficient documentation

## 2019-02-05 DIAGNOSIS — M19072 Primary osteoarthritis, left ankle and foot: Secondary | ICD-10-CM | POA: Diagnosis not present

## 2019-02-05 DIAGNOSIS — L97523 Non-pressure chronic ulcer of other part of left foot with necrosis of muscle: Secondary | ICD-10-CM | POA: Diagnosis not present

## 2019-02-05 DIAGNOSIS — L97524 Non-pressure chronic ulcer of other part of left foot with necrosis of bone: Secondary | ICD-10-CM | POA: Diagnosis not present

## 2019-02-05 DIAGNOSIS — M898X7 Other specified disorders of bone, ankle and foot: Secondary | ICD-10-CM | POA: Insufficient documentation

## 2019-02-05 DIAGNOSIS — M86171 Other acute osteomyelitis, right ankle and foot: Secondary | ICD-10-CM | POA: Diagnosis not present

## 2019-02-05 DIAGNOSIS — Z9889 Other specified postprocedural states: Secondary | ICD-10-CM

## 2019-02-05 DIAGNOSIS — L97529 Non-pressure chronic ulcer of other part of left foot with unspecified severity: Secondary | ICD-10-CM | POA: Diagnosis not present

## 2019-02-05 DIAGNOSIS — M86672 Other chronic osteomyelitis, left ankle and foot: Secondary | ICD-10-CM | POA: Diagnosis not present

## 2019-02-05 DIAGNOSIS — Z87891 Personal history of nicotine dependence: Secondary | ICD-10-CM | POA: Diagnosis not present

## 2019-02-05 DIAGNOSIS — L97522 Non-pressure chronic ulcer of other part of left foot with fat layer exposed: Secondary | ICD-10-CM | POA: Insufficient documentation

## 2019-02-05 HISTORY — PX: TENDON TRANSFER: SHX6109

## 2019-02-05 HISTORY — PX: MASS EXCISION: SHX2000

## 2019-02-05 HISTORY — PX: HAMMER TOE SURGERY: SHX385

## 2019-02-05 IMAGING — DX DG FOOT 2V*L*
2 series · 2 of 2 positions shown · non-contrast
Comparison: None.

CLINICAL DATA: Postop left foot.

EXAM:
LEFT FOOT - 2 VIEW

[foot ap]
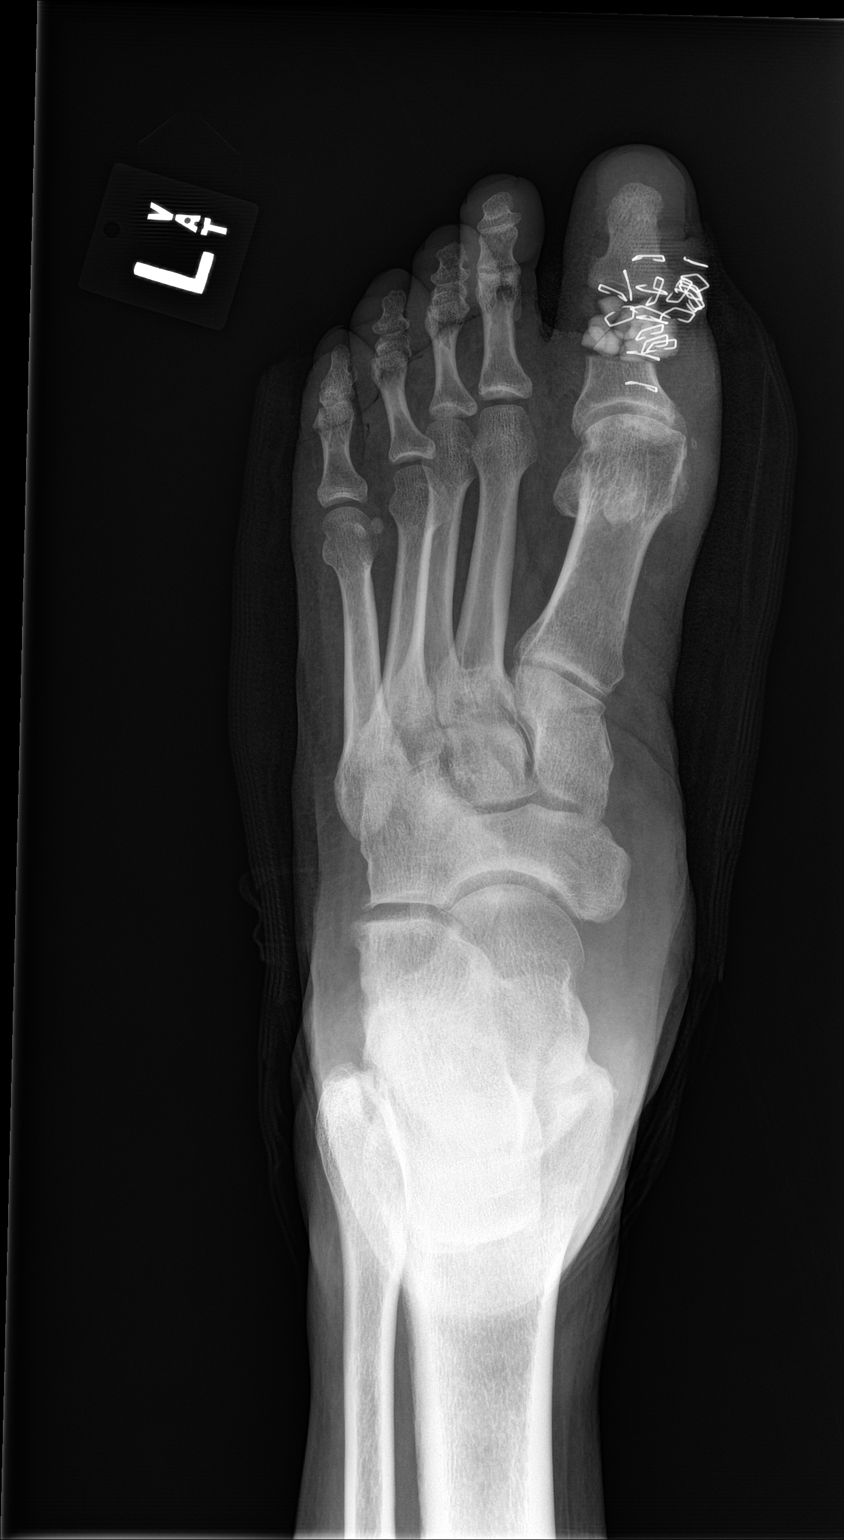

[foot lat]
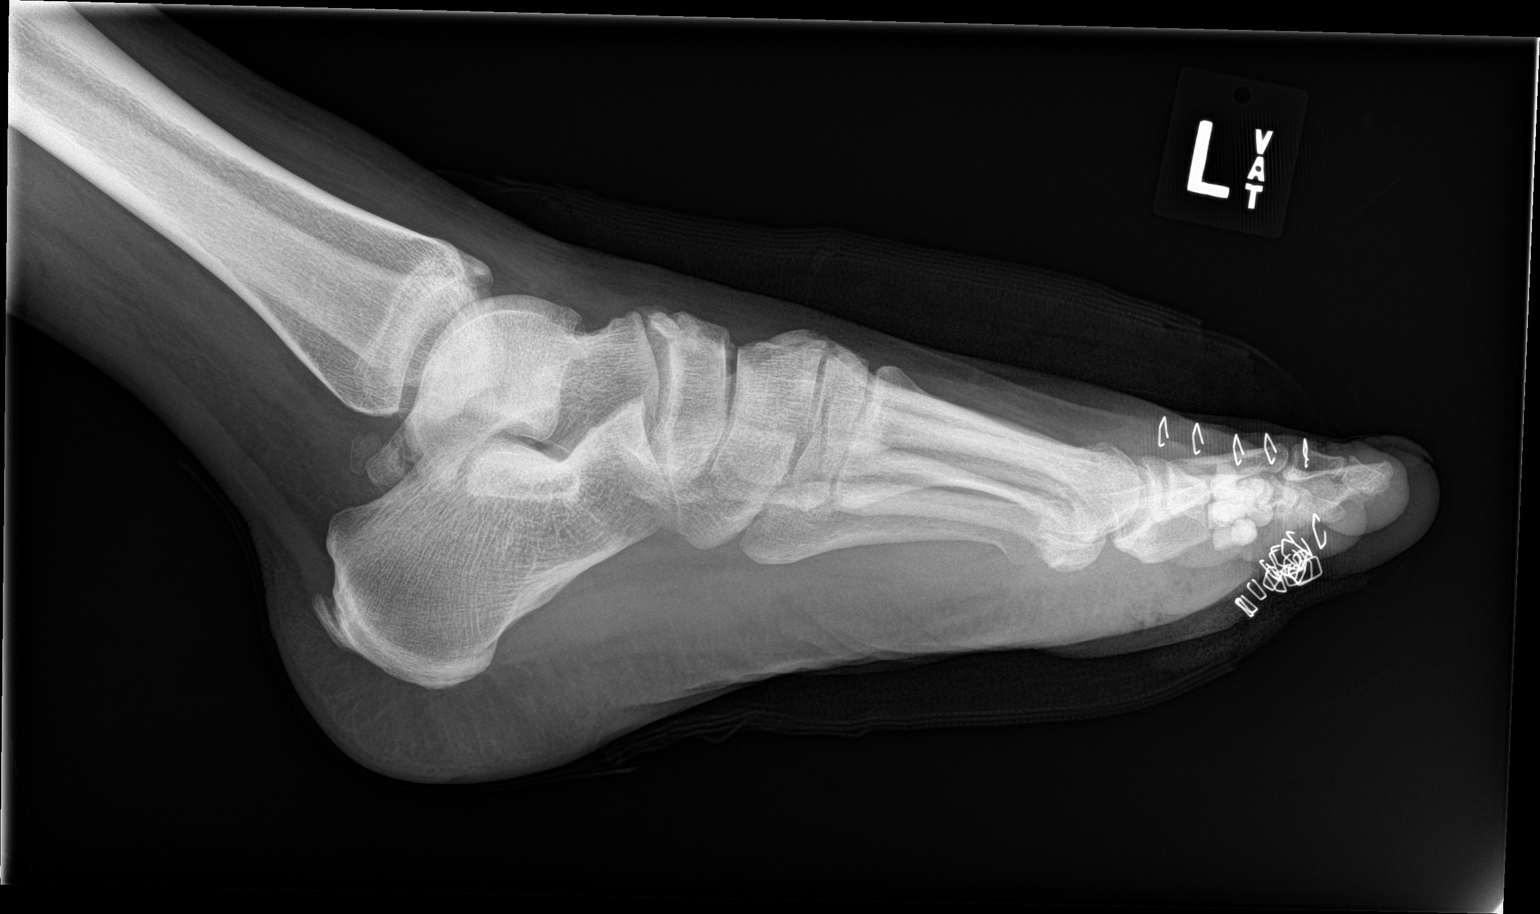

[2 of 2 positions shown; findings below may reference images not displayed]

FINDINGS: Interval resection of distal half of the first proximal phalanx with
antibiotic laden methylmethacrylate in the surgical bed.
Postsurgical changes in the surrounding soft tissues.

No acute fracture or dislocation. Mild osteoarthritis of the first
MTP joint.
IMPRESSION: 1. Interval resection of distal half of the first proximal phalanx
with antibiotic laden methylmethacrylate in the surgical bed.
Postsurgical changes in the surrounding soft tissues.

## 2019-02-05 SURGERY — TRANSFER, TENDON
Anesthesia: Monitor Anesthesia Care | Site: Toe | Laterality: Left

## 2019-02-05 MED ORDER — MIDAZOLAM HCL 5 MG/5ML IJ SOLN
INTRAMUSCULAR | Status: DC | PRN
  Administered 2019-02-05: 2 mg via INTRAVENOUS

## 2019-02-05 MED ORDER — ONDANSETRON HCL 4 MG/2ML IJ SOLN
INTRAMUSCULAR | Status: DC | PRN
  Administered 2019-02-05: 4 mg via INTRAVENOUS

## 2019-02-05 MED ORDER — LIDOCAINE 2% (20 MG/ML) 5 ML SYRINGE
INTRAMUSCULAR | Status: AC
  Filled 2019-02-05: qty 5

## 2019-02-05 MED ORDER — FENTANYL CITRATE (PF) 100 MCG/2ML IJ SOLN
INTRAMUSCULAR | Status: AC
  Filled 2019-02-05: qty 2

## 2019-02-05 MED ORDER — BUPIVACAINE HCL (PF) 0.5 % IJ SOLN
INTRAMUSCULAR | Status: DC | PRN
  Administered 2019-02-05: 10 mL

## 2019-02-05 MED ORDER — BUPIVACAINE HCL (PF) 0.5 % IJ SOLN
INTRAMUSCULAR | Status: AC
  Filled 2019-02-05: qty 30

## 2019-02-05 MED ORDER — DEXAMETHASONE SODIUM PHOSPHATE 10 MG/ML IJ SOLN
INTRAMUSCULAR | Status: DC | PRN
  Administered 2019-02-05: 10 mg via INTRAVENOUS

## 2019-02-05 MED ORDER — ONDANSETRON HCL 4 MG/2ML IJ SOLN
INTRAMUSCULAR | Status: AC
  Filled 2019-02-05: qty 2

## 2019-02-05 MED ORDER — PROPOFOL 500 MG/50ML IV EMUL
INTRAVENOUS | Status: AC
  Filled 2019-02-05: qty 50

## 2019-02-05 MED ORDER — CEFAZOLIN SODIUM-DEXTROSE 2-4 GM/100ML-% IV SOLN
2.0000 g | INTRAVENOUS | Status: AC
  Administered 2019-02-05: 11:00:00 2 g via INTRAVENOUS
  Filled 2019-02-05: qty 100

## 2019-02-05 MED ORDER — 0.9 % SODIUM CHLORIDE (POUR BTL) OPTIME
TOPICAL | Status: DC | PRN
  Administered 2019-02-05: 1000 mL

## 2019-02-05 MED ORDER — LACTATED RINGERS IV SOLN
INTRAVENOUS | Status: DC
  Administered 2019-02-05: 09:00:00 via INTRAVENOUS

## 2019-02-05 MED ORDER — PROPOFOL 10 MG/ML IV BOLUS
INTRAVENOUS | Status: DC | PRN
  Administered 2019-02-05: 40 mg via INTRAVENOUS

## 2019-02-05 MED ORDER — MIDAZOLAM HCL 2 MG/2ML IJ SOLN
INTRAMUSCULAR | Status: AC
  Filled 2019-02-05: qty 2

## 2019-02-05 MED ORDER — FENTANYL CITRATE (PF) 100 MCG/2ML IJ SOLN
INTRAMUSCULAR | Status: DC | PRN
  Administered 2019-02-05 (×2): 50 ug via INTRAVENOUS

## 2019-02-05 MED ORDER — OXYCODONE-ACETAMINOPHEN 5-325 MG PO TABS: 1.0000 | ORAL_TABLET | ORAL | 0 refills | Status: DC | PRN

## 2019-02-05 MED ORDER — HYDROMORPHONE HCL 1 MG/ML IJ SOLN: 0.2500 mg | INTRAMUSCULAR | Status: DC | PRN

## 2019-02-05 MED ORDER — DEXAMETHASONE SODIUM PHOSPHATE 10 MG/ML IJ SOLN
INTRAMUSCULAR | Status: AC
  Filled 2019-02-05: qty 1

## 2019-02-05 MED ORDER — AMOXICILLIN-POT CLAVULANATE 875-125 MG PO TABS: 1.0000 | ORAL_TABLET | Freq: Two times a day (BID) | ORAL | 0 refills | Status: AC

## 2019-02-05 MED ORDER — PROPOFOL 500 MG/50ML IV EMUL
INTRAVENOUS | Status: DC | PRN
  Administered 2019-02-05: 75 ug/kg/min via INTRAVENOUS

## 2019-02-05 MED ORDER — LIDOCAINE 2% (20 MG/ML) 5 ML SYRINGE
INTRAMUSCULAR | Status: DC | PRN
  Administered 2019-02-05: 100 mg via INTRAVENOUS

## 2019-02-05 MED ORDER — VANCOMYCIN HCL 1000 MG IV SOLR
INTRAVENOUS | Status: AC
  Filled 2019-02-05: qty 1000

## 2019-02-05 MED ORDER — PROPOFOL 10 MG/ML IV BOLUS
INTRAVENOUS | Status: AC
  Filled 2019-02-05: qty 20

## 2019-02-05 SURGICAL SUPPLY — 92 items
ADH SKN CLS APL DERMABOND .7 (GAUZE/BANDAGES/DRESSINGS)
APL PRP STRL LF DISP 70% ISPRP (MISCELLANEOUS) ×2
APL SKNCLS STERI-STRIP NONHPOA (GAUZE/BANDAGES/DRESSINGS)
BENZOIN TINCTURE PRP APPL 2/3 (GAUZE/BANDAGES/DRESSINGS) IMPLANT
BLADE CLIPPER SURG (BLADE) IMPLANT
BLADE OSC/SAG .038X5.5 CUT EDG (BLADE) IMPLANT
BLADE PRESCISION 7.0X.51X18.5 (BLADE) ×1 IMPLANT
BLADE SURG 15 STRL LF DISP TIS (BLADE) ×5 IMPLANT
BLADE SURG 15 STRL SS (BLADE) ×3
BLADE SURG SZ10 CARB STEEL (BLADE) ×3 IMPLANT
BNDG CMPR 9X4 STRL LF SNTH (GAUZE/BANDAGES/DRESSINGS) ×2
BNDG CMPR MED 10X6 ELC LF (GAUZE/BANDAGES/DRESSINGS) ×2
BNDG COHESIVE 4X5 TAN STRL (GAUZE/BANDAGES/DRESSINGS) ×3 IMPLANT
BNDG CONFORM 2 STRL LF (GAUZE/BANDAGES/DRESSINGS) ×3 IMPLANT
BNDG ELASTIC 6X10 VLCR STRL LF (GAUZE/BANDAGES/DRESSINGS) ×2 IMPLANT
BNDG ELASTIC 6X5.8 VLCR STR LF (GAUZE/BANDAGES/DRESSINGS) IMPLANT
BNDG ESMARK 4X9 LF (GAUZE/BANDAGES/DRESSINGS) ×3 IMPLANT
BNDG GAUZE ELAST 4 BULKY (GAUZE/BANDAGES/DRESSINGS) ×1 IMPLANT
BRUSH SCRUB EZ PLAIN DRY (MISCELLANEOUS) IMPLANT
CANISTER SUCT 3000ML PPV (MISCELLANEOUS) ×3 IMPLANT
CHLORAPREP W/TINT 26 (MISCELLANEOUS) ×3 IMPLANT
COVER BACK TABLE 60X90IN (DRAPES) ×3 IMPLANT
COVER MAYO STAND REUSABLE (DRAPES) ×3 IMPLANT
COVER MAYO STAND STRL (DRAPES) ×3 IMPLANT
DERMABOND ADVANCED (GAUZE/BANDAGES/DRESSINGS)
DERMABOND ADVANCED .7 DNX12 (GAUZE/BANDAGES/DRESSINGS) IMPLANT
DRAIN CHANNEL 15F RND FF 3/16 (WOUND CARE) IMPLANT
DRAPE EXTREMITY T 121X128X90 (DISPOSABLE) ×3 IMPLANT
DRAPE IMP U-DRAPE 54X76 (DRAPES) ×3 IMPLANT
DRAPE OEC MINIVIEW 54X84 (DRAPES) ×3 IMPLANT
DRAPE SHEET LG 3/4 BI-LAMINATE (DRAPES) ×3 IMPLANT
DRAPE UTILITY XL STRL (DRAPES) ×3 IMPLANT
DRSG EMULSION OIL 3X3 NADH (GAUZE/BANDAGES/DRESSINGS) IMPLANT
DRSG PAD ABDOMINAL 8X10 ST (GAUZE/BANDAGES/DRESSINGS) ×3 IMPLANT
DURAPREP 26ML APPLICATOR (WOUND CARE) ×3 IMPLANT
ELECT COATED BLADE 2.86 ST (ELECTRODE) ×3 IMPLANT
ELECT PENCIL ROCKER SW 15FT (MISCELLANEOUS) ×3 IMPLANT
ELECT REM PT RETURN 15FT ADLT (MISCELLANEOUS) ×3 IMPLANT
EVACUATOR SILICONE 100CC (DRAIN) IMPLANT
GAUZE 4X4 16PLY RFD (DISPOSABLE) IMPLANT
GAUZE SPONGE 4X4 12PLY STRL (GAUZE/BANDAGES/DRESSINGS) ×3 IMPLANT
GAUZE XEROFORM 1X8 LF (GAUZE/BANDAGES/DRESSINGS) ×3 IMPLANT
GLOVE BIO SURGEON STRL SZ7.5 (GLOVE) ×3 IMPLANT
GLOVE BIO SURGEON STRL SZ8 (GLOVE) IMPLANT
GLOVE BIOGEL PI IND STRL 7.5 (GLOVE) ×2 IMPLANT
GLOVE BIOGEL PI IND STRL 8 (GLOVE) ×4 IMPLANT
GLOVE BIOGEL PI INDICATOR 7.5 (GLOVE) ×1
GLOVE BIOGEL PI INDICATOR 8 (GLOVE) ×2
GLOVE EXAM NITRILE PF LG BLUE (GLOVE) IMPLANT
GOWN STRL REUS W/ TWL XL LVL3 (GOWN DISPOSABLE) ×4 IMPLANT
GOWN STRL REUS W/TWL XL LVL3 (GOWN DISPOSABLE) ×6
KIT BASIN OR (CUSTOM PROCEDURE TRAY) ×3 IMPLANT
KIT STIMULAN RAPID CURE  10CC (Orthopedic Implant) ×1 IMPLANT
KIT STIMULAN RAPID CURE 10CC (Orthopedic Implant) ×1 IMPLANT
KIT TURNOVER KIT A (KITS) ×3 IMPLANT
MANIFOLD NEPTUNE II (INSTRUMENTS) IMPLANT
NDL HYPO 25X1 1.5 SAFETY (NEEDLE) ×2 IMPLANT
NEEDLE HYPO 22GX1.5 SAFETY (NEEDLE) ×3 IMPLANT
NEEDLE HYPO 25X1 1.5 SAFETY (NEEDLE) ×3 IMPLANT
NS IRRIG 1000ML POUR BTL (IV SOLUTION) ×3 IMPLANT
PADDING CAST ABS 4INX4YD NS (CAST SUPPLIES)
PADDING CAST ABS COTTON 4X4 ST (CAST SUPPLIES) IMPLANT
PADDING CAST SYNTHETIC 2 (CAST SUPPLIES)
PADDING CAST SYNTHETIC 2X4 NS (CAST SUPPLIES) IMPLANT
PADDING CAST SYNTHETIC 3 NS LF (CAST SUPPLIES)
PADDING CAST SYNTHETIC 3X4 NS (CAST SUPPLIES) IMPLANT
PIN SAFETY STERILE (MISCELLANEOUS) IMPLANT
SPONGE LAP 18X18 RF (DISPOSABLE) ×3 IMPLANT
SPONGE LAP 4X18 RFD (DISPOSABLE) ×3 IMPLANT
STAPLER VISISTAT 35W (STAPLE) ×3 IMPLANT
STOCKINETTE 4X48 STRL (DRAPES) ×3 IMPLANT
STOCKINETTE 6  STRL (DRAPES) ×1
STOCKINETTE 6 STRL (DRAPES) ×2 IMPLANT
STOCKINETTE 8 INCH (MISCELLANEOUS) ×3 IMPLANT
STRIP CLOSURE SKIN 1/2X4 (GAUZE/BANDAGES/DRESSINGS) IMPLANT
SUT ETHILON 4 0 PS 2 18 (SUTURE) ×8 IMPLANT
SUT ETHILON 5 0 PS 2 18 (SUTURE) IMPLANT
SUT MNCRL AB 3-0 PS2 18 (SUTURE) ×1 IMPLANT
SUT MNCRL AB 4-0 PS2 18 (SUTURE) ×1 IMPLANT
SUT PDS AB 2-0 CT2 27 (SUTURE) IMPLANT
SUT SILK 2 0 SH (SUTURE) IMPLANT
SUT VIC AB 2-0 SH 27 (SUTURE)
SUT VIC AB 2-0 SH 27XBRD (SUTURE) IMPLANT
SUT VIC AB 3-0 FS2 27 (SUTURE) ×3 IMPLANT
SUT VICRYL 4-0 PS2 18IN ABS (SUTURE) ×3 IMPLANT
SYR BULB 3OZ (MISCELLANEOUS) ×3 IMPLANT
SYR CONTROL 10ML LL (SYRINGE) ×6 IMPLANT
TOWEL OR 17X26 10 PK STRL BLUE (TOWEL DISPOSABLE) ×6 IMPLANT
TRAY PREP A LATEX SAFE STRL (SET/KITS/TRAYS/PACK) ×3 IMPLANT
TUBING CONNECTING 10 (TUBING) ×3 IMPLANT
UNDERPAD 30X36 HEAVY ABSORB (UNDERPADS AND DIAPERS) ×1 IMPLANT
YANKAUER SUCT BULB TIP NO VENT (SUCTIONS) ×3 IMPLANT

## 2019-02-05 NOTE — Brief Op Note (Signed)
02/05/2019  12:00 PM  PATIENT:  Mark Stein  33 y.o. male  PRE-OPERATIVE DIAGNOSIS:  ULCER  POST-OPERATIVE DIAGNOSIS:  ULCER  PROCEDURE:  Procedure(s): ADJACENT TISSUE TRANSER LEFT (Left) HAMMER TOE CORRECTION BIG TOE (Left) EXCISION BENIGN LESION 2.1-3.1CM (Left)  SURGEON:  Surgeon(s) and Role:    * Evelina Bucy, DPM - Primary  PHYSICIAN ASSISTANT:   ASSISTANTS: none   ANESTHESIA:   local and MAC  EBL:  5 mL   BLOOD ADMINISTERED:none  DRAINS: none   LOCAL MEDICATIONS USED:  MARCAINE    and Amount: 10 ml  SPECIMEN:   ID Type Source Tests Collected by Time Destination  1 : Left great toe bone Tissue PATH Benign ortho SURGICAL PATHOLOGY Evelina Bucy, DPM 02/05/2019 1118   A : Left great toe bone Tissue PATH Benign ortho FUNGUS CULTURE WITH STAIN, AEROBIC/ANAEROBIC CULTURE (SURGICAL/DEEP WOUND), ACID FAST CULTURE WITH REFLEXED SENSITIVITIES, ACID FAST SMEAR (AFB) Evelina Bucy, DPM 02/05/2019 1120       DISPOSITION OF SPECIMEN:  micro,path  COUNTS:  YES  TOURNIQUET:   Total Tourniquet Time Documented: Calf (Left) - 60 minutes Total: Calf (Left) - 60 minutes   DICTATION: .Viviann Spare Dictation  PLAN OF CARE: Discharge to home after PACU  PATIENT DISPOSITION:  PACU - hemodynamically stable.   Delay start of Pharmacological VTE agent (>24hrs) due to surgical blood loss or risk of bleeding: not applicable

## 2019-02-05 NOTE — Anesthesia Preprocedure Evaluation (Signed)
Anesthesia Evaluation  Patient identified by MRN, date of birth, ID band Patient awake    Reviewed: Allergy & Precautions, NPO status , Patient's Chart, lab work & pertinent test results  Airway Mallampati: II  TM Distance: >3 FB     Dental   Pulmonary former smoker,    breath sounds clear to auscultation       Cardiovascular negative cardio ROS   Rhythm:Regular Rate:Normal     Neuro/Psych    GI/Hepatic negative GI ROS, Neg liver ROS,   Endo/Other  negative endocrine ROS  Renal/GU negative Renal ROS     Musculoskeletal   Abdominal   Peds  Hematology   Anesthesia Other Findings   Reproductive/Obstetrics                             Anesthesia Physical Anesthesia Plan  ASA: III  Anesthesia Plan: MAC   Post-op Pain Management:    Induction:   PONV Risk Score and Plan: Ondansetron, Dexamethasone and Midazolam  Airway Management Planned: Nasal Cannula and Simple Face Mask  Additional Equipment:   Intra-op Plan:   Post-operative Plan:   Informed Consent: I have reviewed the patients History and Physical, chart, labs and discussed the procedure including the risks, benefits and alternatives for the proposed anesthesia with the patient or authorized representative who has indicated his/her understanding and acceptance.     Dental advisory given  Plan Discussed with: CRNA and Anesthesiologist  Anesthesia Plan Comments:         Anesthesia Quick Evaluation

## 2019-02-05 NOTE — Addendum Note (Signed)
Addendum  created 02/05/19 1341 by Talbot Grumbling, CRNA   Charge Capture section accepted

## 2019-02-05 NOTE — Op Note (Signed)
Patient Name: Mark Stein DOB: May 18, 1985  MRN: [id number]   Date of Service: 02/05/2019  Surgeon: Dr. Hardie Pulley, DPM Assistants: None Pre-operative Diagnosis:  Left great toe ulcer with necrosis of tendon Post-operative Diagnosis:  Same, plus possible osteomyelitis Procedures:  1) Partial excision of phalanx left hallux  2) Excision of ulcer left hallux with complex closure  3) Adjacent tissue transfer  4) Application of antibiotic delivery device  Pathology/Specimens: ID Type Source Tests Collected by Time Destination  1 : Left great toe bone Tissue PATH Benign ortho SURGICAL PATHOLOGY Evelina Bucy, DPM 02/05/2019 1118   A : Left great toe bone Tissue PATH Benign ortho FUNGUS CULTURE WITH STAIN, AEROBIC/ANAEROBIC CULTURE (SURGICAL/DEEP WOUND), ACID FAST CULTURE WITH REFLEXED SENSITIVITIES, ACID FAST SMEAR (AFB) Evelina Bucy, DPM 02/05/2019 1120    Anesthesia: MAC/local Hemostasis:  Total Tourniquet Time Documented: Calf (Left) - 60 minutes Total: Calf (Left) - 60 minutes  Estimated Blood Loss: 5 mL Materials:  Implant Name Type Inv. Item Serial No. Manufacturer Lot No. LRB No. Used Action  KIT STIMULAN RAPID CURE  10CC - [id number] Orthopedic Implant KIT STIMULAN RAPID CURE  10CC  [employer] [id number] Left 1 Implanted   Medications: 1g Vancomycin (as part of antibiotic beads) Complications: none  Indications for Procedure:  This is a 33 y.o. male with a chronic wound to the left great toe.  Recently the wound has worsened and had necrotic tendon.  It was previously discussed the patient that he would benefit from surgical correction however he wanted to hold off.  We discussed that due to recent worsening it would be best to proceed.  Patient verbalized understanding and wished to proceed.   Procedure in Detail: Patient was identified in pre-operative holding area. Formal consent was signed and the left lower extremity was marked. Patient was brought back  to the operating room. Anesthesia was induced. The extremity was prepped and draped in the usual sterile fashion. Timeout was taken to confirm patient name, laterality, and procedure prior to incision.   Attention was then directed to the left great toe. Charlie Pitter was applied to the open wound at the plantar aspect of the foot for the first portion of the procedure.  Incision was made in the dorsal aspect of the digit.  Dissection was carried down through skin and subcutaneous tissue with care to avoid all vital neurovascular structures all bleeders were cauterized.  The extensor tendon was identified.  Incision was made medial to the extensor tendon.  Dissection was carried down to the joint capsule.  A linear incision was made and the periosteum was freed from the bone.  There was noted to be resorption of the medial aspect of the condyle of the proximal phalangeal head.  The lateral condyle appeared healthy.  It was rather soft and was suspicious for bone infection given the large open wound at the plantar aspect of the joint.  A sagittal saw was used to excise a portion of the proximal phalanx approximately midshaft.  The bony fragment was sharply excised.  A portion of the bone was then sent for culture and the remainder for biopsy.  The incision was copiously irrigated with normal saline.  Stimulan beads were then fashioned with vancomycin powder 1 g according to manufacturer instructions.  These were then inserted into the arthroplasty site. The incision was then closed in layers with 3-0, 4-0 Monocryl 4-0 nylon and skin staples.   Attention was then directed to the plantar aspect of the digit.  There was a wound measuring 2 x 1.5 cm with necrotic appearing tendon and fibrotic wound bed.  The wound was sharply excisionally debrided with a 15 blade and rongeur.  The tendon was debrided.  Approximately 25% of the tendon was healthy and viable.  The wound was copiously irrigated. The resultant deficit was  again 2x1.5cm.  A slide-swing plasty rotation flap was drawn adjacent to the deficit, incised and rotation was tested.  The recipient site was prepped and the skin edges were undermined for later closure.  The flap was rotated into position and the deep tissue was closed with 3-0 Monocryl.  The superficial skin was closed with 4-0 nylon mattress suture and skin staples.  Following rotation flap all incisions at this area were well coapted without residual open wound.  The foot was then dressed with Xeroform 4 x 4 Kerlix and Ace bandage. Patient tolerated the procedure well.   Disposition: Following a period of post-operative monitoring, patient will be transferred home.  We will follow cultures and pathology.  He will be placed on p.o. antibiotics.  Depending upon the results of the culture we might consider IV antibiotics.  If there is also residual bone infection we could consider further bone resection or ultimately amputation.

## 2019-02-05 NOTE — Telephone Encounter (Signed)
Attempted to call patient's fiancee no answer. No VM left.

## 2019-02-05 NOTE — Interval H&P Note (Signed)
History and Physical Interval Note:  02/05/2019 10:09 AM  Mark Stein  has presented today for surgery, with the diagnosis of ULCER.  The various methods of treatment have been discussed with the patient and family. After consideration of risks, benefits and other options for treatment, the patient has consented to  Procedure(s): ADJACENT TISSUE TRANSER LEFT (Left) HAMMER TOE CORRECTION BIG TOE (Left) EXCISION BENIGN LESION 2.1-3.1CM (Left) as a surgical intervention.  The patient's history has been reviewed, patient examined, no change in status, stable for surgery.  I have reviewed the patient's chart and labs.  Questions were answered to the patient's satisfaction.     Evelina Bucy

## 2019-02-05 NOTE — Anesthesia Postprocedure Evaluation (Signed)
Anesthesia Post Note  Patient: Mark Stein  Procedure(s) Performed: ADJACENT TISSUE TRANSER LEFT (Left Toe) HAMMER TOE CORRECTION BIG TOE (Left Toe) EXCISION BENIGN LESION 2.1-3.1CM (Left Foot)     Patient location during evaluation: PACU Anesthesia Type: MAC Level of consciousness: awake Pain management: pain level controlled Vital Signs Assessment: post-procedure vital signs reviewed and stable Respiratory status: spontaneous breathing Cardiovascular status: stable Postop Assessment: no apparent nausea or vomiting Anesthetic complications: no    Last Vitals:  Vitals:   02/05/19 1205 02/05/19 1215  BP: (!) 142/93 (!) 142/101  Pulse: 73 77  Resp: 15 13  Temp: (!) 36.4 C   SpO2: 100% 97%    Last Pain:  Vitals:   02/05/19 1205  TempSrc:   PainSc: 0-No pain                 Sherlyn Ebbert

## 2019-02-05 NOTE — Anesthesia Procedure Notes (Signed)
Date/Time: 02/05/2019 10:30 AM Performed by: Talbot Grumbling, CRNA Oxygen Delivery Method: Simple face mask

## 2019-02-05 NOTE — Transfer of Care (Signed)
Immediate Anesthesia Transfer of Care Note  Patient: Mark Stein  Procedure(s) Performed: ADJACENT TISSUE TRANSER LEFT (Left Toe) HAMMER TOE CORRECTION BIG TOE (Left Toe) EXCISION BENIGN LESION 2.1-3.1CM (Left Foot)  Patient Location: PACU  Anesthesia Type:MAC  Level of Consciousness: awake, alert  and oriented  Airway & Oxygen Therapy: Patient Spontanous Breathing and Patient connected to face mask oxygen  Post-op Assessment: Report given to RN and Post -op Vital signs reviewed and stable  Post vital signs: Reviewed and stable  Last Vitals:  Vitals Value Taken Time  BP    Temp    Pulse 85 02/05/19 1204  Resp    SpO2 100 % 02/05/19 1204  Vitals shown include unvalidated device data.  Last Pain:  Vitals:   02/05/19 0816  TempSrc: Oral  PainSc: 0-No pain         Complications: No apparent anesthesia complications

## 2019-02-06 ENCOUNTER — Telehealth: Payer: Self-pay

## 2019-02-06 ENCOUNTER — Encounter (HOSPITAL_COMMUNITY): Payer: Self-pay | Admitting: Podiatry

## 2019-02-06 LAB — ACID FAST SMEAR (AFB, MYCOBACTERIA): Acid Fast Smear: NEGATIVE

## 2019-02-06 NOTE — Telephone Encounter (Signed)
POST OP CALL-    1) General condition stated by the patient: Doing good  2) Is the pt having pain? No   3) Pain score:   4) Has the pt taken Rx'd pain medication, regularly or PRN?   5) Is the pain medication giving relief?  6) Any fever, chills, nausea, or vomiting, shortness of breath or tightness in calf? None  7) Is the bandage clean, dry and intact? Yes, possible dried betadine or blood on the outside.  8) Is there excessive tightness, bleeding or drainage coming through the bandage? No active bleeding.  9) Did you understand all of the post op instruction sheet given? Yes  10) Any questions or concerns regarding post op care/recovery? None    Confirmed POV appointment with patient

## 2019-02-07 LAB — AEROBIC/ANAEROBIC CULTURE W GRAM STAIN (SURGICAL/DEEP WOUND): Gram Stain: NONE SEEN

## 2019-02-07 LAB — SURGICAL PATHOLOGY

## 2019-02-14 ENCOUNTER — Encounter: Payer: Self-pay | Admitting: Podiatry

## 2019-02-14 ENCOUNTER — Other Ambulatory Visit: Payer: Self-pay

## 2019-02-14 ENCOUNTER — Ambulatory Visit (INDEPENDENT_AMBULATORY_CARE_PROVIDER_SITE_OTHER): Payer: BC Managed Care – PPO | Admitting: Podiatry

## 2019-02-14 ENCOUNTER — Ambulatory Visit (INDEPENDENT_AMBULATORY_CARE_PROVIDER_SITE_OTHER): Payer: BC Managed Care – PPO

## 2019-02-14 VITALS — Temp 97.4°F

## 2019-02-14 DIAGNOSIS — Z9889 Other specified postprocedural states: Secondary | ICD-10-CM

## 2019-02-14 DIAGNOSIS — L97522 Non-pressure chronic ulcer of other part of left foot with fat layer exposed: Secondary | ICD-10-CM

## 2019-02-14 NOTE — Progress Notes (Signed)
  Subjective:  Patient ID: Mark Stein, male    DOB: 1985-10-26,  MRN: [id number]  Chief Complaint  Patient presents with  . Routine Post Op     DOS 02/05/2019 EXC. BENIGN LESION, HAMMER TOE REPAIR HALLUX, AND ADJACENT TISSUE TRANSFER LT " my toe has been quite painful, but I am doing pretty good with it"      33 y.o. male returns for post-op check.  He states that he is doing pretty well.  He denies any nausea fever chills vomiting.  He has been using his cam boot to ambulate.  He denies any other acute complaints.  Review of Systems: Negative except as noted in the HPI. Denies N/V/F/Ch.  Past Medical History:  Diagnosis Date  . Medical history non-contributory     Current Outpatient Medications:  .  collagenase (SANTYL) ointment, Apply 1 application topically daily. Measurements to Left great toe 2.0 x 2.0 x 0.1cm, Disp: 30 g, Rfl: 5 .  oxyCODONE-acetaminophen (PERCOCET) 5-325 MG tablet, Take 1 tablet by mouth every 4 (four) hours as needed for severe pain., Disp: 20 tablet, Rfl: 0 .  silver sulfADIAZINE (SILVADENE) 1 % cream, Apply pea-sized amount to wound daily. (Patient taking differently: Apply 1 application topically daily. Apply pea-sized amount to wound daily.), Disp: 50 g, Rfl: 0  Social History   Tobacco Use  Smoking Status Former Smoker  . Packs/day: 0.25  . Types: Cigars  . Quit date: 08/05/2015  . Years since quitting: 3.5  Smokeless Tobacco Never Used    No Known Allergies Objective:   Vitals:   02/14/19 1014  Temp: (!) 97.4 F (36.3 C)   There is no height or weight on file to calculate BMI. Constitutional Well developed. Well nourished.  Vascular Foot warm and well perfused. Capillary refill normal to all digits.   Neurologic Normal speech. Oriented to person, place, and time. Epicritic sensation to light touch grossly present bilaterally.  Dermatologic Skin healing well without signs of infection. Skin edges well coapted without signs of  infection.  Orthopedic: Tenderness to palpation noted about the surgical site.   Radiographs: 2 views of skeletally mature adult: The antibiotics beads are intact.  No signs of dehiscence.  Hardware is intact.  Surgical margins are sharp.  No osteomyelitis noted. Assessment:   1. Skin ulcer of left foot with fat layer exposed ([place])   2. Post-operative state    Plan:  Patient was evaluated and treated and all questions answered.  S/p foot surgery left -Progressing as expected post-operatively. -XR: See above -WB Status: Weightbearing as tolerated in cam boot -Sutures: Sutures and staples are intact.  Mild dehiscence noted on the plantar aspect.  No erythema or cellulitis noted no other signs of clinical infection noted. -Medications: None -Foot redressed.  No follow-ups on file.

## 2019-02-17 ENCOUNTER — Telehealth: Payer: Self-pay | Admitting: Podiatry

## 2019-02-17 MED ORDER — DOXYCYCLINE HYCLATE 100 MG PO TABS: 100.0000 mg | ORAL_TABLET | Freq: Two times a day (BID) | ORAL | 0 refills | Status: DC

## 2019-02-17 NOTE — Telephone Encounter (Signed)
Done sent doxy [facility]

## 2019-02-17 NOTE — Telephone Encounter (Signed)
Patient says that when he was in last he seen Dr. Posey Pronto bc Dr. March Rummage was out he said patel was gonna call him in a antibiotic to his drug store but one has not been sent

## 2019-02-17 NOTE — Telephone Encounter (Signed)
I informed pt of Dr. Patel's orders. 

## 2019-02-17 NOTE — Addendum Note (Signed)
Addended by: Boneta Lucks on: 02/17/2019 10:07 AM   Modules accepted: Orders

## 2019-02-21 ENCOUNTER — Ambulatory Visit (INDEPENDENT_AMBULATORY_CARE_PROVIDER_SITE_OTHER): Payer: BC Managed Care – PPO | Admitting: Podiatry

## 2019-02-21 ENCOUNTER — Other Ambulatory Visit: Payer: Self-pay

## 2019-02-21 ENCOUNTER — Ambulatory Visit (INDEPENDENT_AMBULATORY_CARE_PROVIDER_SITE_OTHER): Payer: BC Managed Care – PPO

## 2019-02-21 VITALS — Temp 98.9°F

## 2019-02-21 DIAGNOSIS — M2042 Other hammer toe(s) (acquired), left foot: Secondary | ICD-10-CM | POA: Diagnosis not present

## 2019-02-22 NOTE — Progress Notes (Signed)
  Subjective:  Patient ID: Mark Stein, male    DOB: March 22, 1986,  MRN: [id number]  Chief Complaint  Patient presents with  . Routine Post Op    POV #2 DOS 02/05/2019 EXC. BENIGN LESION, HAMMERTOE REPAIR HALLUX AND ADJACENT TISSUE TRANSFER LT. Pt states had a fall and called the office to notify. Pt states he does not feel the fall injured his foot. Pt denies fever/nausea/vomiting/chills.    DOS: 02/05/2019 Procedure: Left hallux arthroplasty, excision of wound, wound closure with adjacent tissue transfer  33 y.o. male returns for post-op check.  History above confirmed with patient  Review of Systems: Negative except as noted in the HPI. Denies N/V/F/Ch.  Past Medical History:  Diagnosis Date  . Medical history non-contributory     Current Outpatient Medications:  .  collagenase (SANTYL) ointment, Apply 1 application topically daily. Measurements to Left great toe 2.0 x 2.0 x 0.1cm, Disp: 30 g, Rfl: 5 .  doxycycline (VIBRA-TABS) 100 MG tablet, Take 1 tablet (100 mg total) by mouth 2 (two) times daily., Disp: 20 tablet, Rfl: 0 .  oxyCODONE-acetaminophen (PERCOCET) 5-325 MG tablet, Take 1 tablet by mouth every 4 (four) hours as needed for severe pain., Disp: 20 tablet, Rfl: 0 .  silver sulfADIAZINE (SILVADENE) 1 % cream, Apply pea-sized amount to wound daily. (Patient taking differently: Apply 1 application topically daily. Apply pea-sized amount to wound daily.), Disp: 50 g, Rfl: 0  Social History   Tobacco Use  Smoking Status Former Smoker  . Packs/day: 0.25  . Types: Cigars  . Quit date: 08/05/2015  . Years since quitting: 3.5  Smokeless Tobacco Never Used    No Known Allergies Objective:   Vitals:   02/21/19 1635  Temp: 98.9 F (37.2 C)   There is no height or weight on file to calculate BMI. Constitutional Well developed. Well nourished.  Vascular Foot warm and well perfused. Capillary refill normal to all digits.   Neurologic Normal speech. Oriented to person,  place, and time. Epicritic sensation to light touch grossly present bilaterally.  Dermatologic Skin healing well dorsally.  Plantarly there is macerated tissue with suture failure and failure of some of the rotation flap.  No warmth no erythema no signs of acute infection  Orthopedic: Tenderness to palpation noted about the surgical site.   Radiographs: Taken and reviewed successful arthroplasty of the digit with placement of antibiotic beads Assessment:   1. Hammertoe of left foot    Plan:  Patient was evaluated and treated and all questions answered.  S/p foot surgery left -Progressing as expected post-operatively. -XR: as above -WB Status: WBAT in shoe -Sutures: Removed today. -Selective debridement of the plantar aspect of the wound.  He does appear to have part of the rotation flap appears to have failed to incorporate.  The wound does not appear to have signs of infection -Medications: none refilled -Foot redressed.  Betadine wet-to-dry applied to the plantar aspect of the toe  No follow-ups on file.

## 2019-02-24 ENCOUNTER — Telehealth: Payer: Self-pay | Admitting: Podiatry

## 2019-02-24 NOTE — Addendum Note (Signed)
Addended by: Hardie Pulley on: 02/24/2019 04:15 PM   Modules accepted: Orders

## 2019-02-24 NOTE — Telephone Encounter (Signed)
Pt called requesting refill of Oxycodone. [facility] [address]

## 2019-02-24 NOTE — Telephone Encounter (Signed)
I spoke with pt and asked pt to describe the sensation he was having in the foot. Pt states he has been walking more in the boot, and has a lot of tightness. I told pt that he may be over doing it just a little, and for periods of the tightness, rest, and elevate, can take OTC Ibuprofen if he tolerates, in between the doses of the oxycodone. I told pt I would inform Dr. March Rummage and for him to contact his pharmacy later today.

## 2019-02-25 MED ORDER — OXYCODONE-ACETAMINOPHEN 5-325 MG PO TABS: 1.0000 | ORAL_TABLET | ORAL | 0 refills | Status: DC | PRN

## 2019-02-25 NOTE — Telephone Encounter (Signed)
I informed pt of Dr. March Rummage delay of percocet orders due to escribe for narcotic being down, that the rx was [id number] at 7:33[facility] today. Pt states understanding and thanked me for my call.

## 2019-02-25 NOTE — Addendum Note (Signed)
Addended by: Hardie Pulley on: 02/25/2019 07:33 AM   Modules accepted: Orders

## 2019-02-27 ENCOUNTER — Other Ambulatory Visit: Payer: Self-pay

## 2019-02-27 ENCOUNTER — Ambulatory Visit (INDEPENDENT_AMBULATORY_CARE_PROVIDER_SITE_OTHER): Payer: BC Managed Care – PPO | Admitting: Podiatry

## 2019-02-27 DIAGNOSIS — M2042 Other hammer toe(s) (acquired), left foot: Secondary | ICD-10-CM

## 2019-02-27 DIAGNOSIS — L97522 Non-pressure chronic ulcer of other part of left foot with fat layer exposed: Secondary | ICD-10-CM

## 2019-02-27 NOTE — Progress Notes (Signed)
  Subjective:  Patient ID: Mark Stein, male    DOB: [birth year],  MRN: [id number]  Chief Complaint  Patient presents with  . Routine Post Op    02/05/2019 EXC. BENIGN LESION, HAMMER TOE REPAIR HALLUX, AND ADJACENT TISSUE TRANSFER LT    DOS: 02/05/2019 Procedure: Left hallux arthroplasty, excision of wound, wound closure with adjacent tissue transfer  33 y.o. male returns for post-op check.  History above confirmed with patient  Review of Systems: Negative except as noted in the HPI. Denies N/V/F/Ch.  Past Medical History:  Diagnosis Date  . Medical history non-contributory     Current Outpatient Medications:  .  collagenase (SANTYL) ointment, Apply 1 application topically daily. Measurements to Left great toe 2.0 x 2.0 x 0.1cm, Disp: 30 g, Rfl: 5 .  doxycycline (VIBRA-TABS) 100 MG tablet, Take 1 tablet (100 mg total) by mouth 2 (two) times daily., Disp: 20 tablet, Rfl: 0 .  oxyCODONE-acetaminophen (PERCOCET) 5-325 MG tablet, Take 1 tablet by mouth every 4 (four) hours as needed for severe pain., Disp: 20 tablet, Rfl: 0 .  silver sulfADIAZINE (SILVADENE) 1 % cream, Apply pea-sized amount to wound daily. (Patient taking differently: Apply 1 application topically daily. Apply pea-sized amount to wound daily.), Disp: 50 g, Rfl: 0  Social History   Tobacco Use  Smoking Status Former Smoker  . Packs/day: 0.25  . Types: Cigars  . Quit date: 08/05/2015  . Years since quitting: 3.5  Smokeless Tobacco Never Used    No Known Allergies Objective:   There were no vitals filed for this visit. There is no height or weight on file to calculate BMI. Constitutional Well developed. Well nourished.  Vascular Foot warm and well perfused. Capillary refill normal to all digits.   Neurologic Normal speech. Oriented to person, place, and time. Epicritic sensation to light touch grossly present bilaterally.  Dermatologic Skin well-healed dorsally.  Plantarly still a small evident wound  without warmth erythema signs of acute infection.  Fibrogranular base with some depth without probe to bone  Orthopedic: Tenderness to palpation noted about the surgical site.   Radiographs: None Assessment:   1. Hammertoe of left foot   2. Skin ulcer of left foot with fat layer exposed ([place])    Plan:  Patient was evaluated and treated and all questions answered.  S/p foot surgery left -Progressing as expected post-operatively. -XR: as above -WB Status: WBAT in boot. -Sutures: Staples removed from the plantar aspect of the digit. -Medications: none refilled -Foot redressed.  Prisma and dry sterile dressing applied.  Return in about 2 weeks (around 03/13/2019) for Post Op.

## 2019-03-06 LAB — FUNGUS CULTURE WITH STAIN

## 2019-03-06 LAB — FUNGAL ORGANISM REFLEX

## 2019-03-06 LAB — FUNGUS CULTURE RESULT

## 2019-03-13 ENCOUNTER — Other Ambulatory Visit: Payer: Self-pay

## 2019-03-13 ENCOUNTER — Ambulatory Visit (INDEPENDENT_AMBULATORY_CARE_PROVIDER_SITE_OTHER): Payer: BC Managed Care – PPO | Admitting: Podiatry

## 2019-03-13 DIAGNOSIS — M2042 Other hammer toe(s) (acquired), left foot: Secondary | ICD-10-CM

## 2019-03-13 DIAGNOSIS — Z9889 Other specified postprocedural states: Secondary | ICD-10-CM

## 2019-03-14 ENCOUNTER — Telehealth: Payer: Self-pay | Admitting: Podiatry

## 2019-03-14 NOTE — Telephone Encounter (Signed)
Pt states the pain is when walking in the surgery sandal from the boot, has a lot of stinging.

## 2019-03-14 NOTE — Telephone Encounter (Signed)
Having some pain meds and wanted a refill sent to [facility] [address]

## 2019-03-21 LAB — ACID FAST CULTURE WITH REFLEXED SENSITIVITIES (MYCOBACTERIA): Acid Fast Culture: NEGATIVE

## 2019-04-04 ENCOUNTER — Ambulatory Visit (INDEPENDENT_AMBULATORY_CARE_PROVIDER_SITE_OTHER): Payer: BC Managed Care – PPO

## 2019-04-04 ENCOUNTER — Other Ambulatory Visit: Payer: Self-pay

## 2019-04-04 ENCOUNTER — Ambulatory Visit (INDEPENDENT_AMBULATORY_CARE_PROVIDER_SITE_OTHER): Payer: BC Managed Care – PPO | Admitting: Podiatry

## 2019-04-04 DIAGNOSIS — M2042 Other hammer toe(s) (acquired), left foot: Secondary | ICD-10-CM

## 2019-04-06 NOTE — Progress Notes (Signed)
  Subjective:  Patient ID: Mark Stein, male    DOB: 1985-12-28,  MRN: [id number]  No chief complaint on file.   DOS: 02/05/2019 Procedure: Left hallux arthroplasty, excision of wound, wound closure with adjacent tissue transfer  33 y.o. male returns for post-op check.  States that the toe is doing better denies new issues or concerns  Review of Systems: Negative except as noted in the HPI. Denies N/V/F/Ch.  Past Medical History:  Diagnosis Date  . Medical history non-contributory     Current Outpatient Medications:  .  collagenase (SANTYL) ointment, Apply 1 application topically daily. Measurements to Left great toe 2.0 x 2.0 x 0.1cm, Disp: 30 g, Rfl: 5 .  doxycycline (VIBRA-TABS) 100 MG tablet, Take 1 tablet (100 mg total) by mouth 2 (two) times daily., Disp: 20 tablet, Rfl: 0 .  oxyCODONE-acetaminophen (PERCOCET) 5-325 MG tablet, Take 1 tablet by mouth every 4 (four) hours as needed for severe pain., Disp: 20 tablet, Rfl: 0 .  silver sulfADIAZINE (SILVADENE) 1 % cream, Apply pea-sized amount to wound daily. (Patient taking differently: Apply 1 application topically daily. Apply pea-sized amount to wound daily.), Disp: 50 g, Rfl: 0  Social History   Tobacco Use  Smoking Status Former Smoker  . Packs/day: 0.25  . Types: Cigars  . Quit date: 08/05/2015  . Years since quitting: 3.6  Smokeless Tobacco Never Used    No Known Allergies Objective:   There were no vitals filed for this visit. There is no height or weight on file to calculate BMI. Constitutional Well developed. Well nourished.  Vascular Foot warm and well perfused. Capillary refill normal to all digits.   Neurologic Normal speech. Oriented to person, place, and time. Epicritic sensation to light touch grossly present bilaterally.  Dermatologic Skin well-healed dorsally.  Healing plantarly without significant wound.  Sutures and staples intact skin macerated no warmth no erythema no signs of acute infection   Orthopedic: Tenderness to palpation noted about the surgical site.   Radiographs: None Assessment:   1. Post-operative state   2. Hammertoe of left foot    Plan:  Patient was evaluated and treated and all questions answered.  S/p foot surgery left -Progressing as expected post-operatively. -XR: as above -WB Status: WBAT in boot. -Sutures: Remaining sutures and staples removed -Medications: none refilled -Foot redressed.  Prisma and dry sterile dressing applied.  -Follow-up in 2 weeks for wound check  No follow-ups on file.

## 2019-04-10 ENCOUNTER — Encounter: Payer: Self-pay | Admitting: Podiatry

## 2019-04-27 NOTE — Progress Notes (Signed)
  Subjective:  Patient ID: Khaidyn Staebell, male    DOB: January 28, 1986,  MRN: [id number]  Chief Complaint  Patient presents with  . Routine Post Op    DOS 02/05/2019 EXC. BENIGN LESION, HAMMERTOE REPAIR HALLUX,  AND ADJACENT TISSUE TRANSFER. Pt states some minor bleeding from the area and still having some stinging pain, no other concerns.    DOS: 02/05/2019 Procedure: Left hallux arthroplasty, excision of wound, wound closure with adjacent tissue transfer  34 y.o. male returns for post-op check.  States that the toe is doing better denies new issues or concerns  Review of Systems: Negative except as noted in the HPI. Denies N/V/F/Ch.  Past Medical History:  Diagnosis Date  . Medical history non-contributory     Current Outpatient Medications:  .  collagenase (SANTYL) ointment, Apply 1 application topically daily. Measurements to Left great toe 2.0 x 2.0 x 0.1cm, Disp: 30 g, Rfl: 5 .  doxycycline (VIBRA-TABS) 100 MG tablet, Take 1 tablet (100 mg total) by mouth 2 (two) times daily., Disp: 20 tablet, Rfl: 0 .  oxyCODONE-acetaminophen (PERCOCET) 5-325 MG tablet, Take 1 tablet by mouth every 4 (four) hours as needed for severe pain., Disp: 20 tablet, Rfl: 0 .  silver sulfADIAZINE (SILVADENE) 1 % cream, Apply pea-sized amount to wound daily. (Patient taking differently: Apply 1 application topically daily. Apply pea-sized amount to wound daily.), Disp: 50 g, Rfl: 0  Social History   Tobacco Use  Smoking Status Former Smoker  . Packs/day: 0.25  . Types: Cigars  . Quit date: 08/05/2015  . Years since quitting: 3.7  Smokeless Tobacco Never Used    No Known Allergies Objective:   There were no vitals filed for this visit. There is no height or weight on file to calculate BMI. Constitutional Well developed. Well nourished.  Vascular Foot warm and well perfused. Capillary refill normal to all digits.   Neurologic Normal speech. Oriented to person, place, and time. Epicritic sensation  to light touch grossly present bilaterally.  Dermatologic  wound to left great toe almost completely healed without warmth erythema signs of acute infection  Orthopedic: No tenderness to palpation noted about the surgical site.   Radiographs: None Assessment:   1. Hammertoe of left foot    Plan:  Patient was evaluated and treated and all questions answered.  S/p foot surgery left -Almost completely healed continue ointment and bandage until fully healed.  Transition to normal shoe  as tolerated  -Follow-up in 4 weeks for wound check  No follow-ups on file.

## 2019-05-09 ENCOUNTER — Encounter: Payer: BC Managed Care – PPO | Admitting: Podiatry

## 2019-05-16 ENCOUNTER — Ambulatory Visit (INDEPENDENT_AMBULATORY_CARE_PROVIDER_SITE_OTHER): Payer: BC Managed Care – PPO

## 2019-05-16 ENCOUNTER — Ambulatory Visit (INDEPENDENT_AMBULATORY_CARE_PROVIDER_SITE_OTHER): Payer: BC Managed Care – PPO | Admitting: Podiatry

## 2019-05-16 ENCOUNTER — Other Ambulatory Visit: Payer: Self-pay

## 2019-05-16 DIAGNOSIS — L97522 Non-pressure chronic ulcer of other part of left foot with fat layer exposed: Secondary | ICD-10-CM

## 2019-05-16 NOTE — Progress Notes (Signed)
  Subjective:  Patient ID: Mark Stein, male    DOB: [birth year],  MRN: [id number]  Chief Complaint  Patient presents with  . Routine Post Op    DOS [id number] EXC. BENIGN LESION, HAMMERTOE REPAIR HALLUX AND ADJACENT TISSUE TRANSFER. Pt states he is concerned with sub 1st wound as he believes it is healing slowly. Denies fever/chills/nausea/vomiting.    34 y.o. male presents for wound care. Hx confirmed with patient.  Objective:  Physical Exam: Wound Location: left 1st toe Wound Measurement: 1x1 post-debridement Wound Base: Mixed Granular/Fibrotic Peri-wound: Calloused Exudate: Moderate amount Serosanguinous exudate wound without warmth, erythema, signs of acute infection  No images are attached to the encounter.  Radiographs:  X-ray of the left foot: successful arthroplasty no osseous erosions noted  Assessment:   1. Skin ulcer of left foot with fat layer exposed (HCC)      Plan:  Patient was evaluated and treated and all questions answered.  Ulcer left hallux -XR reviewed with patient -Dressing applied consisting of silvadene -Wound cleansed and debrided -Dispense offloading toe crest pad  Procedure: Excisional Debridement of Wound Rationale: Removal of non-viable soft tissue from the wound to promote healing.  Anesthesia: none Pre-Debridement Wound Measurements: 0.5 cm x 0.5 cm x 0.2 cm  Post-Debridement Wound Measurements: 1 cm x 1 cm x 0.2 cm  Type of Debridement: Sharp Excisional Tissue Removed: Non-viable soft tissue Depth of Debridement: subcutaneous tissue. Technique: Sharp excisional debridement to bleeding, viable wound base.  Dressing: Dry, sterile, compression dressing. Disposition: Patient tolerated procedure well. Patient to return in 1 week for follow-up.  Return in about 1 month (around 06/16/2019).

## 2019-06-19 ENCOUNTER — Ambulatory Visit: Payer: BC Managed Care – PPO | Admitting: Podiatry

## 2019-07-04 ENCOUNTER — Other Ambulatory Visit: Payer: Self-pay

## 2019-07-04 ENCOUNTER — Ambulatory Visit (INDEPENDENT_AMBULATORY_CARE_PROVIDER_SITE_OTHER): Payer: BC Managed Care – PPO | Admitting: Podiatry

## 2019-07-04 DIAGNOSIS — L97522 Non-pressure chronic ulcer of other part of left foot with fat layer exposed: Secondary | ICD-10-CM | POA: Diagnosis not present

## 2019-07-04 DIAGNOSIS — M21372 Foot drop, left foot: Secondary | ICD-10-CM

## 2019-07-04 NOTE — Progress Notes (Signed)
  Subjective:  Patient ID: Mark Stein, male    DOB: 1986-04-08,  MRN: [id number]  Chief Complaint  Patient presents with  . Hammer Toe    Pt states no improvement and does have some pain. Denies fever/chills/nausea/vomiting.    34 y.o. male presents for wound care. Hx confirmed with patient.  Objective:  Physical Exam: Wound Location: left hallux Wound Measurement: 2x2 Wound Base: Granular/Healthy Peri-wound: Calloused Exudate: Scant/small amount Serosanguinous exudate wound without warmth, erythema, signs of acute infection  High steppage gait, dropfoot left foot Assessment:   1. Skin ulcer of left foot with fat layer exposed (HCC)   2. Left foot drop    Plan:  Patient was evaluated and treated and all questions answered.  Ulcer Left hallux -Dressing applied consisting of silvadene -Wound cleansed and debrided  Procedure: Excisional Debridement of Wound Rationale: Removal of non-viable soft tissue from the wound to promote healing.  Anesthesia: none Pre-Debridement Wound Measurements: 1.5 cm x 1.5 cm x 0.2 cm  Post-Debridement Wound Measurements: 2 cm x 2 cm x 0.2 cm  Type of Debridement: Sharp Excisional Tissue Removed: Non-viable soft tissue Depth of Debridement: subcutaneous tissue. Technique: Sharp excisional debridement to bleeding, viable wound base.  Dressing: Dry, sterile, compression dressing. Disposition: Patient tolerated procedure well.

## 2019-07-08 NOTE — Progress Notes (Signed)
  Subjective:  Patient ID: Mark Stein, male    DOB: 12-Feb-1986,  MRN: [id number]  Chief Complaint  Patient presents with  . Foot Ulcer    left great toe is draining w/ walking, little bleeding    DOS: 10/16/2018 Procedure: Right great toe arthroplasty packing antiobiotic beads, distal phlax condylectomy, bone biopsy, wound closure  34 y.o. male returns for post-op check. Hx as above.   Review of Systems: Negative except as noted in the HPI. Denies N/V/F/Ch.  Past Medical History:  Diagnosis Date  . Medical history non-contributory     Current Outpatient Medications:  .  silver sulfADIAZINE (SILVADENE) 1 % cream, Apply pea-sized amount to wound daily. (Patient not taking: Reported on 10/17/2019), Disp: 50 g, Rfl: 0 .  collagenase (SANTYL) ointment, Apply 1 application topically daily. Measurements to Left great toe 2.0 x 2.0 x 0.1cm (Patient not taking: Reported on 10/17/2019), Disp: 30 g, Rfl: 5 .  oxyCODONE-acetaminophen (PERCOCET) 5-325 MG tablet, Take 1 tablet by mouth every 4 (four) hours as needed for severe pain. (Patient not taking: Reported on 10/17/2019), Disp: 20 tablet, Rfl: 0  Social History   Tobacco Use  Smoking Status Former Smoker  . Packs/day: 0.25  . Types: Cigars  . Quit date: 08/05/2015  . Years since quitting: 4.2  Smokeless Tobacco Never Used    No Known Allergies Objective:   There were no vitals filed for this visit. There is no height or weight on file to calculate BMI. Constitutional Well developed. Well nourished.  Vascular Foot warm and well perfused. Capillary refill normal to all digits.   Neurologic Normal speech. Oriented to person, place, and time. Epicritic sensation to light touch grossly present bilaterally.  Dermatologic Skin healing well without signs of infection. Slight callus formation noted with good healed skin underneath. Left hallux ulcer with granular base measuring 2x2 post debridement. Malodorous but no warmth erythema  signs of acute infeciton.  Orthopedic: Tenderness to palpation noted about the surgical site.   Radiographs: None  No results found for this or any previous visit (from the past 720 hour(s)). Assessment:   1. Skin ulcer of right foot, limited to breakdown of skin (HCC)   2. Skin ulcer of left foot with fat layer exposed (HCC)   3. Hammer toe of left foot    Plan:  Patient was evaluated and treated and all questions answered.  S/p foot surgery right -Well healed  Left Hallux Ulcer -Would benefit from correction for chronic ulceration.   -Patient has failed all conservative therapy and wishes to proceed with surgical intervention. All risks, benefits, and alternatives discussed with patient. No guarantees given. Consent reviewed and signed by patient. -Planned procedures: Hallux arthroplasty with wound excision and adjacent tissue transfer.      No follow-ups on file.

## 2019-07-28 ENCOUNTER — Other Ambulatory Visit: Payer: Self-pay

## 2019-07-28 ENCOUNTER — Ambulatory Visit (INDEPENDENT_AMBULATORY_CARE_PROVIDER_SITE_OTHER): Payer: BC Managed Care – PPO | Admitting: Podiatry

## 2019-07-28 ENCOUNTER — Encounter: Payer: Self-pay | Admitting: Podiatry

## 2019-07-28 ENCOUNTER — Ambulatory Visit (INDEPENDENT_AMBULATORY_CARE_PROVIDER_SITE_OTHER): Payer: BC Managed Care – PPO

## 2019-07-28 ENCOUNTER — Telehealth: Payer: Self-pay | Admitting: *Deleted

## 2019-07-28 VITALS — Temp 97.2°F

## 2019-07-28 DIAGNOSIS — L97522 Non-pressure chronic ulcer of other part of left foot with fat layer exposed: Secondary | ICD-10-CM | POA: Diagnosis not present

## 2019-07-28 DIAGNOSIS — S90822A Blister (nonthermal), left foot, initial encounter: Secondary | ICD-10-CM

## 2019-07-28 MED ORDER — DOXYCYCLINE HYCLATE 100 MG PO TABS: 100.0000 mg | ORAL_TABLET | Freq: Two times a day (BID) | ORAL | 0 refills | Status: DC

## 2019-07-28 NOTE — Telephone Encounter (Signed)
I spoke with pt and he states Dr. Samuella Cota is treating him for a ulcer on the bottom of his foot and he has had swelling in his toes and now a burst blister on the toes. Pt is scheduled for 3:30pm today with Dr. Ardelle Anton.

## 2019-07-30 ENCOUNTER — Telehealth: Payer: Self-pay | Admitting: Podiatry

## 2019-07-30 NOTE — Telephone Encounter (Signed)
Dr. Ardelle Anton, I am waiting on last visit notes for Mr. Lamountain. [employer] is blowing up my phone.

## 2019-07-31 NOTE — Telephone Encounter (Signed)
Note is done. I didn't know you were waiting on this.

## 2019-07-31 NOTE — Progress Notes (Signed)
Subjective: 34 year old male presents the office today for concerns of blisters to his left second, third, fourth toes.  He has had a chronic wound on the plantar aspect of the hallux for some time.  Previously had surgery with Dr. Samuella Cota.  He has been keeping antibiotic ointment on the wound daily.  He does work and is on his feet all day.  Is not had any pus come to the area no increase in swelling or redness. Denies any systemic complaints such as fevers, chills, nausea, vomiting. No acute changes since last appointment, and no other complaints at this time.   Objective: AAO x3, NAD DP/PT pulses palpable bilaterally, CRT less than 3 seconds Ulceration plantar aspect of the left hallux.  After debridement the wound measures 1.5 x 1.5 x 1.5 cm.  There is fibrotic tissue prior to debridement as well as hyperkeratotic tissue on the periwound.  There is edema to the hallux which she states has been chronic.  No erythema.  Minimal swelling to the foot.  There is old blister formation present with second, third, fourth toe.  No purulence identified. No open lesions or pre-ulcerative lesions.  No pain with calf compression, swelling, warmth, erythema  Assessment: Ulcerations left foot with blister formation  Plan: -All treatment options discussed with the patient including all alternatives, risks, complications.  -X-rays obtained reviewed.  No obvious signs of acute osteomyelitis.  No soft tissue present.  Status post resection of partial phalanx of the proximal phalanx. -Sharply debrided the wound today utilizing the 312 with scalpel down to healthy, granulation tissue.  We will switch to Medihoney dressing changes for now.  Onto the lesser toes keep areas clean and dry.  Wound culture of the hallux was obtained.  Prescribed doxycycline.  He has a surgical shoe at home and I dispensed a Pegassit to offload the wound.  Also recommend him hold off on returning to work till the wounds to heal prior to  returning.  He will follow with Dr. Samuella Cota next week. -Patient encouraged to call the office with any questions, concerns, change in symptoms.   Vivi Barrack DPM

## 2019-08-01 LAB — WOUND CULTURE
MICRO NUMBER:: 10327515
SPECIMEN QUALITY:: ADEQUATE

## 2019-08-08 ENCOUNTER — Other Ambulatory Visit: Payer: Self-pay

## 2019-08-08 ENCOUNTER — Other Ambulatory Visit: Payer: BC Managed Care – PPO | Admitting: Orthotics

## 2019-08-08 ENCOUNTER — Telehealth: Payer: Self-pay | Admitting: *Deleted

## 2019-08-08 ENCOUNTER — Ambulatory Visit (INDEPENDENT_AMBULATORY_CARE_PROVIDER_SITE_OTHER): Payer: BC Managed Care – PPO | Admitting: Podiatry

## 2019-08-08 VITALS — Temp 97.2°F

## 2019-08-08 DIAGNOSIS — M21372 Foot drop, left foot: Secondary | ICD-10-CM

## 2019-08-08 DIAGNOSIS — S90822A Blister (nonthermal), left foot, initial encounter: Secondary | ICD-10-CM

## 2019-08-08 DIAGNOSIS — Z9889 Other specified postprocedural states: Secondary | ICD-10-CM

## 2019-08-08 DIAGNOSIS — L97522 Non-pressure chronic ulcer of other part of left foot with fat layer exposed: Secondary | ICD-10-CM

## 2019-08-08 NOTE — Telephone Encounter (Signed)
-----   Message from Park Liter, DPM sent at 08/08/2019 10:23 AM EDT ----- Can we get appt with neuro to eval for dropfoot left?

## 2019-08-08 NOTE — Telephone Encounter (Signed)
Faxed referral, clinicals for 6 months, and demographics to [facility].

## 2019-08-12 ENCOUNTER — Encounter: Payer: Self-pay | Admitting: Neurology

## 2019-08-21 ENCOUNTER — Encounter: Payer: Self-pay | Admitting: Podiatry

## 2019-08-21 ENCOUNTER — Other Ambulatory Visit: Payer: Self-pay

## 2019-08-21 ENCOUNTER — Ambulatory Visit (INDEPENDENT_AMBULATORY_CARE_PROVIDER_SITE_OTHER): Payer: BC Managed Care – PPO | Admitting: Podiatry

## 2019-08-21 DIAGNOSIS — M21372 Foot drop, left foot: Secondary | ICD-10-CM

## 2019-08-21 DIAGNOSIS — L97522 Non-pressure chronic ulcer of other part of left foot with fat layer exposed: Secondary | ICD-10-CM

## 2019-08-29 ENCOUNTER — Telehealth: Payer: Self-pay | Admitting: Podiatry

## 2019-08-29 NOTE — Telephone Encounter (Signed)
I need office notes for patient . He went back to work on 5/6, I need to know what his restrictions are. The note says Light duty, and I need to know what the pt. Is restricted from doing.

## 2019-09-02 NOTE — Telephone Encounter (Signed)
Pt is calling he has returned to work and the note says light duty, what are the restrictions for the patient. Please advise

## 2019-09-18 ENCOUNTER — Ambulatory Visit (INDEPENDENT_AMBULATORY_CARE_PROVIDER_SITE_OTHER): Payer: BC Managed Care – PPO | Admitting: Podiatry

## 2019-09-18 ENCOUNTER — Encounter: Payer: Self-pay | Admitting: Podiatry

## 2019-09-18 ENCOUNTER — Other Ambulatory Visit: Payer: Self-pay

## 2019-09-18 DIAGNOSIS — L97522 Non-pressure chronic ulcer of other part of left foot with fat layer exposed: Secondary | ICD-10-CM

## 2019-10-06 NOTE — Progress Notes (Signed)
  Subjective:  Patient ID: Mark Stein, male    DOB: 08-07-1985,  MRN: [id number]  Chief Complaint  Patient presents with  . Wound Check    Bottom of L hallux. Pt stated, "Improving. I haven't applied MediHoney or worn my surgical shoe this week; I've been resting. Finished doxycycline.Pain = 3-4/10. Some bleeding. No pus/foul odors/excessive swelling/fever/chills/N&V".   Melene Plan    Bottom of R hallux. Pt stated, "I've had this callus since my surgery. It's painful".   34 y.o. male presents for wound care. Hx confirmed with patient.  Objective:  Physical Exam: Wound Location: left hallux Wound Measurement: 2x2 Wound Base: Granular/Healthy Peri-wound: Calloused Exudate: Scant/small amount Serosanguinous exudate wound without warmth, erythema, signs of acute infection  High steppage gait, dropfoot left foot Assessment:   1. Skin ulcer of left foot with fat layer exposed (HCC)    Plan:  Patient was evaluated and treated and all questions answered.  Ulcer Left hallux -Dressing applied consisting of silvadene -Wound cleansed and debrided -Make appt with orthotist for dropfoot brace  Procedure: Selective Debridement of Wound Rationale: Removal of devitalized tissue from the wound to promote healing.  Pre-Debridement Wound Measurements: 2 cm x 2 cm x 0.2 cm  Post-Debridement Wound Measurements: same as pre-debridement. Type of Debridement: sharp selective Tissue Removed: Devitalized soft-tissue Dressing: Dry, sterile, compression dressing. Disposition: Patient tolerated procedure well. Patient to return in 1 week for follow-up.

## 2019-10-06 NOTE — Progress Notes (Signed)
  Subjective:  Patient ID: Mark Stein, male    DOB: 04-Mar-1986,  MRN: [id number]  Chief Complaint  Patient presents with  . Foot Ulcer    Pt states no concerns, denies fever/chills/nausea/vomiting. Pt states he is using a brace for dropfoot which he believes has been helpful.   34 y.o. male presents for wound care. Hx confirmed with patient.  Objective:  Physical Exam: Wound Location: left hallux Wound Measurement: 2x2 Wound Base: Granular/Healthy Peri-wound: Calloused Exudate: Scant/small amount Serosanguinous exudate wound without warmth, erythema, signs of acute infection  High steppage gait, dropfoot left foot Assessment:   1. Skin ulcer of left foot with fat layer exposed (HCC)   2. Left foot drop    Plan:  Patient was evaluated and treated and all questions answered.  Ulcer Left hallux -Wound cleansed and debrided -Wound dressed with prisma and DSD -Wound culture reviewed - normal flora

## 2019-10-12 NOTE — Progress Notes (Signed)
  Subjective:  Patient ID: Mark Stein, male    DOB: April 07, 1986,  MRN: [id number]  Chief Complaint  Patient presents with  . Wound Check    Left 1st digit ulcer check. Pt denies fever/chills/nausea/vomiting. 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    34 y.o. male presents for wound care. Hx confirmed with patient.  Objective:  Physical Exam: Wound Location: left hallux Wound Measurement: 1x1.5 Wound Base: Granular/Healthy Peri-wound: Calloused Exudate: Scant/small amount Serosanguinous exudate wound without warmth, erythema, signs of acute infection  High steppage gait, dropfoot left foot Assessment:   1. Skin ulcer of left foot with fat layer exposed (HCC)    Plan:  Patient was evaluated and treated and all questions answered.  Ulcer Left hallux -Wound cleansed and debrided -Dressed with PRISMA and DSD -Discussed imoprtance of using dropfoot brace to prevent worsening ulcer. -Have pt f/u with orthotist for further eval of dropfoot brace

## 2019-10-15 NOTE — Progress Notes (Signed)
NEUROLOGY CONSULTATION NOTE  Mark Stein MRN: [id number] DOB: 07-15-1985  Referring provider: Hardie Pulley, DPM Primary care provider: No PCP  Reason for consult:  Left foot drop  HISTORY OF PRESENT ILLNESS: Mark Stein is a 34 year old right-handed male who presents for left foot drop.  History supplemented by podiatry notes.  For several years, he has suffered from hammertoe with ulcers in both feet, including osteomyelitis in the toe of his right foot.  Hgb A1c in 2017 was 4.8.  He has undergone bilateral foot surgeries.  In the interim, he developed a left foot drop.  It may have started after he tripped and twisted his left knee in 2018.  Over the past several months, he developed a right foot drop as well.  He denies pain or numbness in the lower extremities.  Sometimes when he is laying down in bed, he notes paresthesias from his lower back down back of both legs to the entire feet.  Some nonradiating back pain but no radicular pain down the legs.  No involvement of upper extremities.  Weakness has not progressed.    PAST MEDICAL HISTORY: Past Medical History:  Diagnosis Date  . Medical history non-contributory     PAST SURGICAL HISTORY: Past Surgical History:  Procedure Laterality Date  . EXCISION PARTIAL PHALANX Right 10/16/2018   Procedure: right great toe wound debridement and closure. arthroplasty packing antiobiotic beads. distal phlax chondrolectomy . bone biopsy.;  Surgeon: Evelina Bucy, DPM;  Location: [facility];  Service: Podiatry;  Laterality: Right;  . HAMMER TOE SURGERY Left 02/05/2019   Procedure: HAMMER TOE CORRECTION BIG TOE;  Surgeon: Evelina Bucy, DPM;  Location: [facility];  Service: Podiatry;  Laterality: Left;  Marland Kitchen MASS EXCISION Left 02/05/2019   Procedure: EXCISION BENIGN LESION 2.1-3.1CM;  Surgeon: Evelina Bucy, DPM;  Location: [facility];  Service: Podiatry;  Laterality: Left;  . NO PAST SURGERIES    . TENDON TRANSFER Left 02/05/2019   Procedure:  ADJACENT TISSUE TRANSER LEFT;  Surgeon: Evelina Bucy, DPM;  Location: [facility];  Service: Podiatry;  Laterality: Left;    MEDICATIONS: Current Outpatient Medications on File Prior to Visit  Medication Sig Dispense Refill  . collagenase (SANTYL) ointment Apply 1 application topically daily. Measurements to Left great toe 2.0 x 2.0 x 0.1cm 30 g 5  . oxyCODONE-acetaminophen (PERCOCET) 5-325 MG tablet Take 1 tablet by mouth every 4 (four) hours as needed for severe pain. 20 tablet 0  . silver sulfADIAZINE (SILVADENE) 1 % cream Apply pea-sized amount to wound daily. (Patient taking differently: Apply 1 application topically daily. Apply pea-sized amount to wound daily.) 50 g 0   No current facility-administered medications on file prior to visit.    ALLERGIES: No Known Allergies  FAMILY HISTORY: No family history on file.  SOCIAL HISTORY: Social History   Socioeconomic History  . Marital status: Single    Spouse name: Not on file  . Number of children: Not on file  . Years of education: Not on file  . Highest education level: Not on file  Occupational History  . Not on file  Tobacco Use  . Smoking status: Former Smoker    Packs/day: 0.25    Types: Cigars    Quit date: 08/05/2015    Years since quitting: 4.1  . Smokeless tobacco: Never Used  Vaping Use  . Vaping Use: Never used  Substance and Sexual Activity  . Alcohol use: Yes    Comment: 2-3 drinks a  week   . Drug use: Yes    Frequency: 2.0 times per week    Types: Marijuana    Comment: last use yesterday ;   . Sexual activity: Not on file  Other Topics Concern  . Not on file  Social History Narrative  . Not on file   Social Determinants of Health   Financial Resource Strain:   . Difficulty of Paying Living Expenses:   Food Insecurity:   . Worried About Programme researcher, broadcasting/film/video in the Last Year:   . Barista in the Last Year:   Transportation Needs:   . Freight forwarder (Medical):   Marland Kitchen Lack of  Transportation (Non-Medical):   Physical Activity:   . Days of Exercise per Week:   . Minutes of Exercise per Session:   Stress:   . Feeling of Stress :   Social Connections:   . Frequency of Communication with Friends and Family:   . Frequency of Social Gatherings with Friends and Family:   . Attends Religious Services:   . Active Member of Clubs or Organizations:   . Attends Banker Meetings:   Marland Kitchen Marital Status:   Intimate Partner Violence:   . Fear of Current or Ex-Partner:   . Emotionally Abused:   Marland Kitchen Physically Abused:   . Sexually Abused:     PHYSICAL EXAM: Blood pressure (!) 155/96, pulse 95, height 6' (1.829 m), weight 287 lb 12.8 oz (130.5 kg), SpO2 98 %. General: No acute distress.  Patient appears well-groomed.  Head:  Normocephalic/atraumatic Eyes:  fundi examined but not visualized Neck: supple, no paraspinal tenderness, full range of motion Back: No paraspinal tenderness Heart: regular rate and rhythm Lungs: Clear to auscultation bilaterally. Vascular: No carotid bruits. Neurological Exam: Mental status: alert and oriented to person, place, and time, recent and remote memory intact, fund of knowledge intact, attention and concentration intact, speech fluent and not dysarthric, language intact. Cranial nerves: CN I: not tested CN II: pupils equal, round and reactive to light, visual fields intact CN III, IV, VI:  full range of motion, no nystagmus, no ptosis CN V: facial sensation intact CN VII: upper and lower face symmetric CN VIII: hearing intact CN IX, X: gag intact, uvula midline CN XI: sternocleidomastoid and trapezius muscles intact CN XII: tongue midline Bulk & Tone: normal, no fasciculations. Motor:  0/5 bilateral ankle dorsiflexion, bilateral foot inversion, left foot plantarflexion, 5-5 right foot plantarflexion, otherwise 5/5 throughout Sensation:  Pinprick sensation reduced on bottom of both feet and back of ankles, otherwise intact;  and vibration sensation slightly reduced in both feet.   Deep Tendon Reflexes:  Trace upper extremities, absent lower extremities, toes downgoing.  Finger to nose testing:  Without dysmetria.   Heel to shin:  Without dysmetria.   Gait:  Bilateral steppage gait.  Romberg negative.  IMPRESSION: Bilateral foot drop.  Unclear if bilateral peroneal nerve palsies but given weakness of other muscles in the ankle/feet, would like to evaluate for lumbosacral spine etiology.  PLAN: 1.  MRI of lumbar spine with and without contrast 2.  NCV-EMG lower extremities 3.  Follow up after testing.  Further recommendations pending results.  Thank you for allowing me to take part in the care of this patient  Shon Millet, DO  CC:  Ventura Sellers, DPM

## 2019-10-17 ENCOUNTER — Encounter: Payer: Self-pay | Admitting: Neurology

## 2019-10-17 ENCOUNTER — Other Ambulatory Visit: Payer: Self-pay

## 2019-10-17 ENCOUNTER — Ambulatory Visit (INDEPENDENT_AMBULATORY_CARE_PROVIDER_SITE_OTHER): Payer: BC Managed Care – PPO | Admitting: Neurology

## 2019-10-17 VITALS — BP 155/96 | HR 95 | Ht 72.0 in | Wt 287.8 lb

## 2019-10-17 DIAGNOSIS — M21371 Foot drop, right foot: Secondary | ICD-10-CM

## 2019-10-17 DIAGNOSIS — M21372 Foot drop, left foot: Secondary | ICD-10-CM

## 2019-10-17 NOTE — Patient Instructions (Signed)
1.  We will check MRI of lumbar spine with and without contrast 2.  Nerve conduction study of bilateral lower extremities 3.  Follow up after testing.  Further recommendations pending results.

## 2019-11-06 ENCOUNTER — Other Ambulatory Visit: Payer: Self-pay

## 2019-11-06 ENCOUNTER — Ambulatory Visit (INDEPENDENT_AMBULATORY_CARE_PROVIDER_SITE_OTHER): Payer: BC Managed Care – PPO

## 2019-11-06 ENCOUNTER — Ambulatory Visit (INDEPENDENT_AMBULATORY_CARE_PROVIDER_SITE_OTHER): Payer: BC Managed Care – PPO | Admitting: Podiatry

## 2019-11-06 ENCOUNTER — Emergency Department (HOSPITAL_COMMUNITY): Payer: BC Managed Care – PPO

## 2019-11-06 ENCOUNTER — Inpatient Hospital Stay (HOSPITAL_COMMUNITY): Payer: BC Managed Care – PPO

## 2019-11-06 ENCOUNTER — Inpatient Hospital Stay (HOSPITAL_COMMUNITY)
Admission: EM | Admit: 2019-11-06 | Discharge: 2019-11-10 | DRG: 854 | Disposition: A | Discharge status: Home / Self Care | Payer: BC Managed Care – PPO | Attending: Family Medicine | Admitting: Family Medicine

## 2019-11-06 ENCOUNTER — Encounter: Payer: Self-pay | Admitting: Podiatry

## 2019-11-06 ENCOUNTER — Encounter (HOSPITAL_COMMUNITY): Payer: Self-pay | Admitting: Obstetrics and Gynecology

## 2019-11-06 VITALS — BP 172/105 | HR 111 | Temp 101.1°F

## 2019-11-06 DIAGNOSIS — M21371 Foot drop, right foot: Secondary | ICD-10-CM | POA: Diagnosis not present

## 2019-11-06 DIAGNOSIS — M86672 Other chronic osteomyelitis, left ankle and foot: Secondary | ICD-10-CM | POA: Diagnosis not present

## 2019-11-06 DIAGNOSIS — L97522 Non-pressure chronic ulcer of other part of left foot with fat layer exposed: Secondary | ICD-10-CM | POA: Diagnosis not present

## 2019-11-06 DIAGNOSIS — M869 Osteomyelitis, unspecified: Secondary | ICD-10-CM | POA: Diagnosis present

## 2019-11-06 DIAGNOSIS — L02612 Cutaneous abscess of left foot: Secondary | ICD-10-CM | POA: Diagnosis present

## 2019-11-06 DIAGNOSIS — Z20822 Contact with and (suspected) exposure to covid-19: Secondary | ICD-10-CM | POA: Diagnosis present

## 2019-11-06 DIAGNOSIS — L0390 Cellulitis, unspecified: Secondary | ICD-10-CM | POA: Diagnosis not present

## 2019-11-06 DIAGNOSIS — M86171 Other acute osteomyelitis, right ankle and foot: Secondary | ICD-10-CM | POA: Diagnosis not present

## 2019-11-06 DIAGNOSIS — L02619 Cutaneous abscess of unspecified foot: Secondary | ICD-10-CM

## 2019-11-06 DIAGNOSIS — R000 Tachycardia, unspecified: Secondary | ICD-10-CM | POA: Diagnosis not present

## 2019-11-06 DIAGNOSIS — E669 Obesity, unspecified: Secondary | ICD-10-CM | POA: Diagnosis not present

## 2019-11-06 DIAGNOSIS — L03818 Cellulitis of other sites: Secondary | ICD-10-CM

## 2019-11-06 DIAGNOSIS — L97524 Non-pressure chronic ulcer of other part of left foot with necrosis of bone: Secondary | ICD-10-CM

## 2019-11-06 DIAGNOSIS — A419 Sepsis, unspecified organism: Secondary | ICD-10-CM | POA: Diagnosis not present

## 2019-11-06 DIAGNOSIS — L03119 Cellulitis of unspecified part of limb: Secondary | ICD-10-CM | POA: Diagnosis not present

## 2019-11-06 DIAGNOSIS — L97529 Non-pressure chronic ulcer of other part of left foot with unspecified severity: Secondary | ICD-10-CM | POA: Diagnosis not present

## 2019-11-06 DIAGNOSIS — R600 Localized edema: Secondary | ICD-10-CM | POA: Diagnosis not present

## 2019-11-06 DIAGNOSIS — Z6838 Body mass index (BMI) 38.0-38.9, adult: Secondary | ICD-10-CM | POA: Diagnosis not present

## 2019-11-06 DIAGNOSIS — L03116 Cellulitis of left lower limb: Secondary | ICD-10-CM | POA: Diagnosis present

## 2019-11-06 DIAGNOSIS — M21372 Foot drop, left foot: Secondary | ICD-10-CM

## 2019-11-06 DIAGNOSIS — S98112A Complete traumatic amputation of left great toe, initial encounter: Secondary | ICD-10-CM | POA: Diagnosis not present

## 2019-11-06 DIAGNOSIS — Z9889 Other specified postprocedural states: Secondary | ICD-10-CM

## 2019-11-06 DIAGNOSIS — M86172 Other acute osteomyelitis, left ankle and foot: Secondary | ICD-10-CM | POA: Diagnosis not present

## 2019-11-06 DIAGNOSIS — Z87891 Personal history of nicotine dependence: Secondary | ICD-10-CM

## 2019-11-06 DIAGNOSIS — R509 Fever, unspecified: Secondary | ICD-10-CM | POA: Diagnosis not present

## 2019-11-06 LAB — CBC WITH DIFFERENTIAL/PLATELET
Abs Immature Granulocytes: 0.23 10*3/uL — ABNORMAL HIGH (ref 0.00–0.07)
Basophils Absolute: 0.1 10*3/uL (ref 0.0–0.1)
Basophils Relative: 0 %
Eosinophils Absolute: 0.1 10*3/uL (ref 0.0–0.5)
Eosinophils Relative: 0 %
HCT: 40.6 % (ref 39.0–52.0)
Hemoglobin: 13.3 g/dL (ref 13.0–17.0)
Immature Granulocytes: 1 %
Lymphocytes Relative: 5 %
Lymphs Abs: 1.2 10*3/uL (ref 0.7–4.0)
MCH: 29.4 pg (ref 26.0–34.0)
MCHC: 32.8 g/dL (ref 30.0–36.0)
MCV: 89.8 fL (ref 80.0–100.0)
Monocytes Absolute: 1.5 10*3/uL — ABNORMAL HIGH (ref 0.1–1.0)
Monocytes Relative: 6 %
Neutro Abs: 22.3 10*3/uL — ABNORMAL HIGH (ref 1.7–7.7)
Neutrophils Relative %: 88 %
Platelets: 353 10*3/uL (ref 150–400)
RBC: 4.52 MIL/uL (ref 4.22–5.81)
RDW: 12.4 % (ref 11.5–15.5)
WBC: 25.4 10*3/uL — ABNORMAL HIGH (ref 4.0–10.5)
nRBC: 0 % (ref 0.0–0.2)

## 2019-11-06 LAB — COMPREHENSIVE METABOLIC PANEL
ALT: 27 U/L (ref 0–44)
AST: 19 U/L (ref 15–41)
Albumin: 3.9 g/dL (ref 3.5–5.0)
Alkaline Phosphatase: 67 U/L (ref 38–126)
Anion gap: 12 (ref 5–15)
BUN: 11 mg/dL (ref 6–20)
CO2: 23 mmol/L (ref 22–32)
Calcium: 8.8 mg/dL — ABNORMAL LOW (ref 8.9–10.3)
Chloride: 102 mmol/L (ref 98–111)
Creatinine, Ser: 0.74 mg/dL (ref 0.61–1.24)
GFR calc Af Amer: >60 mL/min (ref >60)
GFR calc non Af Amer: >60 mL/min (ref >60)
Glucose, Bld: 100 mg/dL — ABNORMAL HIGH (ref 70–99)
Potassium: 3.7 mmol/L (ref 3.5–5.1)
Sodium: 137 mmol/L (ref 135–145)
Total Bilirubin: 1.4 mg/dL — ABNORMAL HIGH (ref 0.3–1.2)
Total Protein: 8.5 g/dL — ABNORMAL HIGH (ref 6.5–8.1)

## 2019-11-06 LAB — APTT: aPTT: 32 seconds (ref 24–36)

## 2019-11-06 LAB — LACTIC ACID, PLASMA
Lactic Acid, Venous: 0.8 mmol/L (ref 0.5–1.9)
Lactic Acid, Venous: 1.3 mmol/L (ref 0.5–1.9)

## 2019-11-06 LAB — SARS CORONAVIRUS 2 BY RT PCR (HOSPITAL ORDER, PERFORMED IN ~~LOC~~ HOSPITAL LAB): SARS Coronavirus 2: NEGATIVE

## 2019-11-06 LAB — PROTIME-INR
INR: 1.1 (ref 0.8–1.2)
Prothrombin Time: 14.1 seconds (ref 11.4–15.2)

## 2019-11-06 IMAGING — MR MR FOOT*L* WO/W CM
9 series · 40 of 40 positions shown · IV contrast (gadavist)
Comparison: [DATE]

CLINICAL DATA: History of prior left foot surgery [DATE],
fever, tachycardia, left foot swelling

EXAM:
MRI OF THE LEFT FOREFOOT WITHOUT AND WITH CONTRAST
TECHNIQUE: Multiplanar, multisequence MR imaging of the left foot was performed
both before and after administration of intravenous contrast.
CONTRAST:  10mL GADAVIST GADOBUTROL 1 MMOL/ML IV SOLN

[Series 3: T2 fat-sat · axial · left · 3.0mm · 0.47mm/px · z∈[-51,+22]mm · 5 of 23 slices shown (1 of 2)]
[im 1/23]
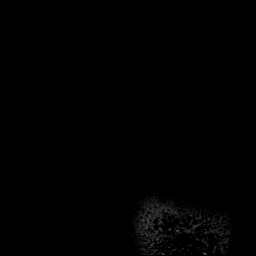
[im 6/23]
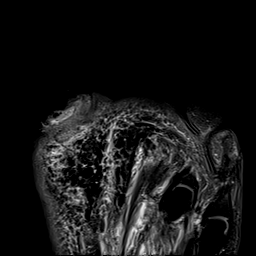
[im 12/23]
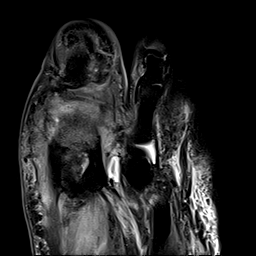
[im 17/23]
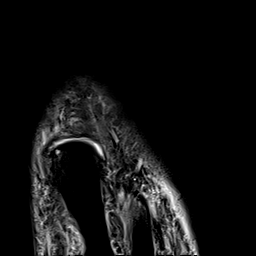
[im 23/23]
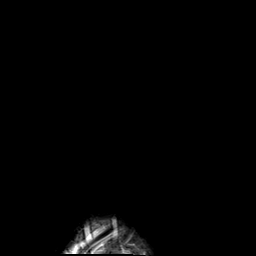

[Series 4: T1 · axial · left · 3.0mm · 0.47mm/px · z∈[-51,+22]mm · 5 of 23 slices shown (1 of 2)]
[im 1/23]
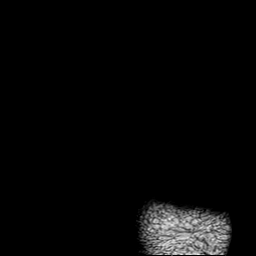
[im 6/23]
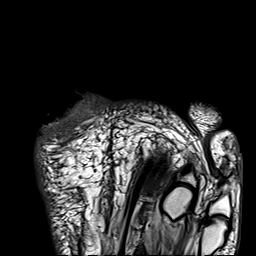
[im 12/23]
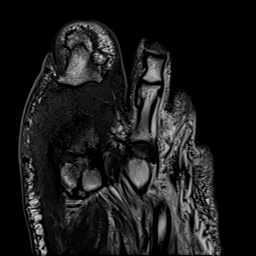
[im 17/23]
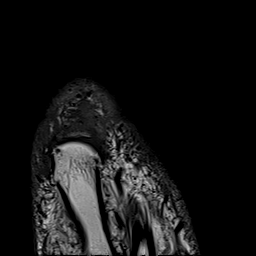
[im 23/23]
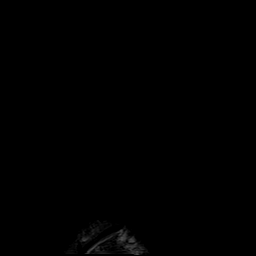

[Series 5: T1 · coronal · left · 3.0mm · 0.47mm/px · 4 of 26 slices shown (2 of 2)]
[im 1/26]
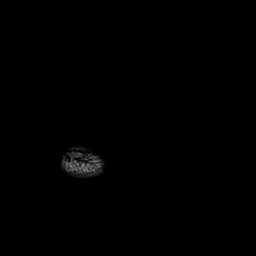
[im 9/26]
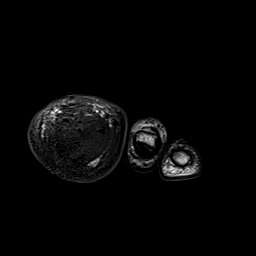
[im 17/26]
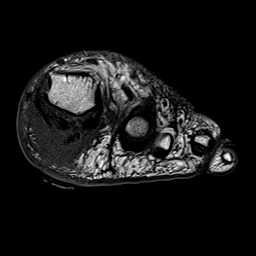
[im 26/26]
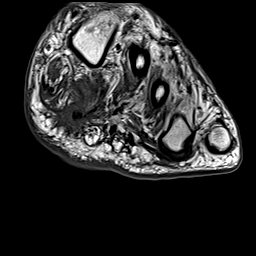

[Series 6: T2 fat-sat · coronal · left · 3.0mm · 0.38mm/px · 4 of 26 slices shown (2 of 2)]
[im 1/26]
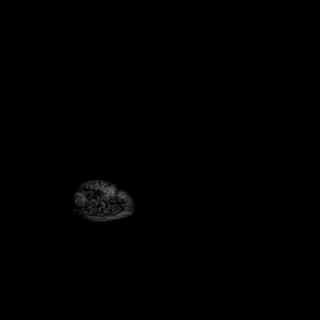
[im 9/26]
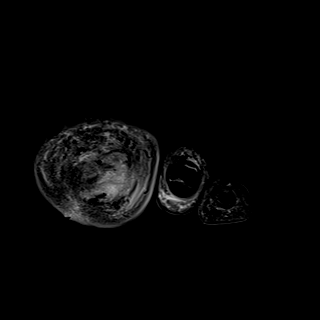
[im 17/26]
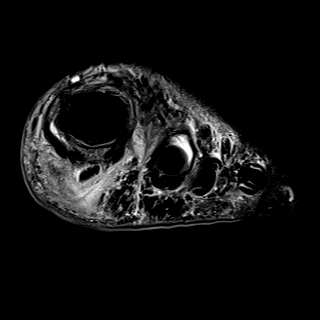
[im 26/26]
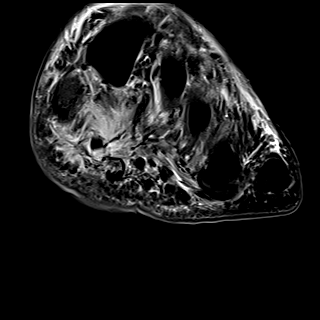

[Series 7: STIR · sagittal · left · 3.0mm · 0.23mm/px · 5 of 30 slices shown]
[im 1/30]
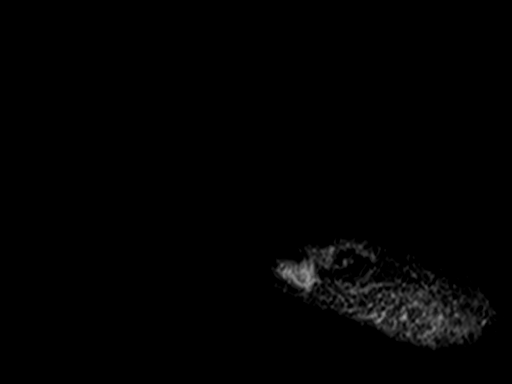
[im 8/30]
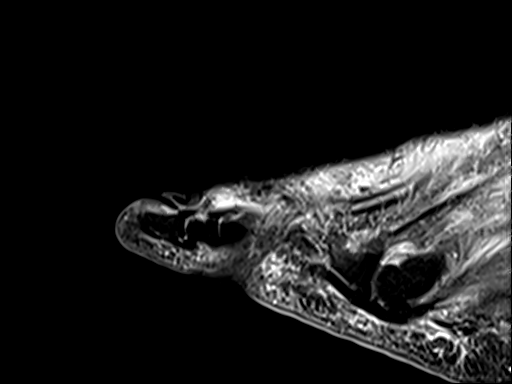
[im 15/30]
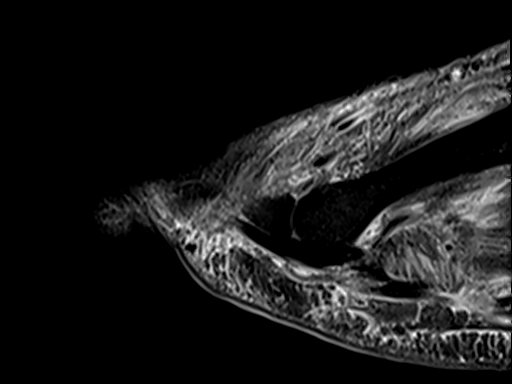
[im 22/30]
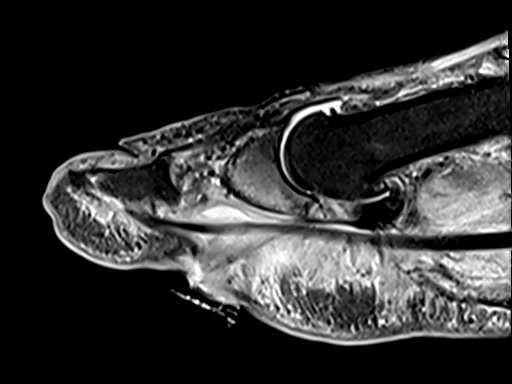
[im 30/30]
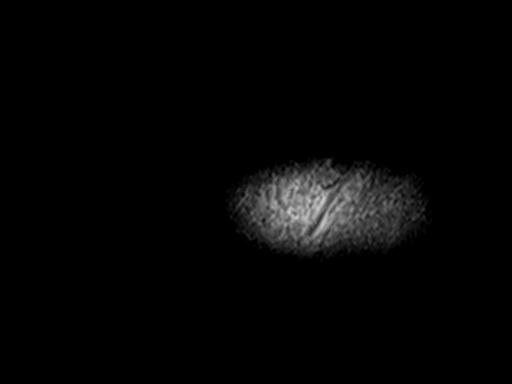

[Series 8: T1 fat-sat · coronal · non-contrast · left · 3.0mm · 0.47mm/px · 4 of 26 slices shown]
[im 1/26]
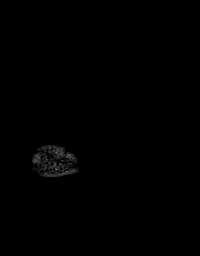
[im 9/26]
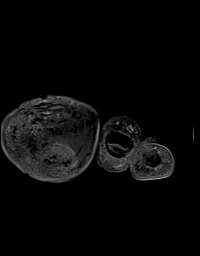
[im 17/26]
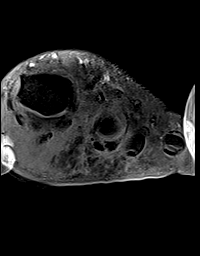
[im 26/26]
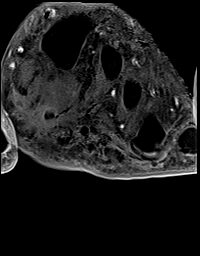

[Series 9: T1 fat-sat post-contrast · axial · left · 3.0mm · 0.38mm/px · z∈[-51,+22]mm · 4 of 23 slices shown (1 of 3)]
[im 1/23]
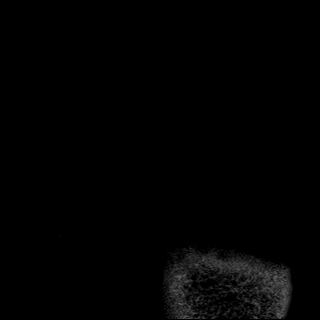
[im 8/23]
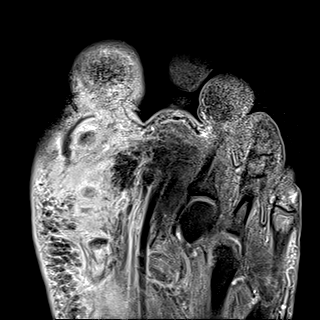
[im 15/23]
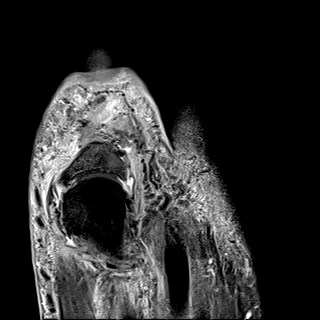
[im 23/23]
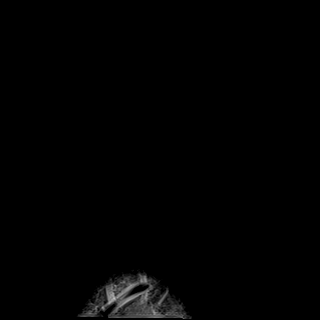

[Series 10: T1 fat-sat post-contrast · sagittal · left · 3.0mm · 0.23mm/px · 5 of 30 slices shown (2 of 3)]
[im 1/30]
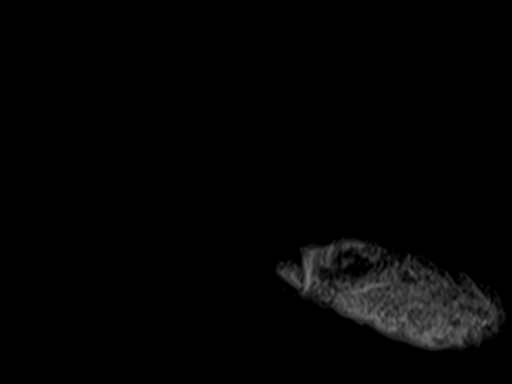
[im 8/30]
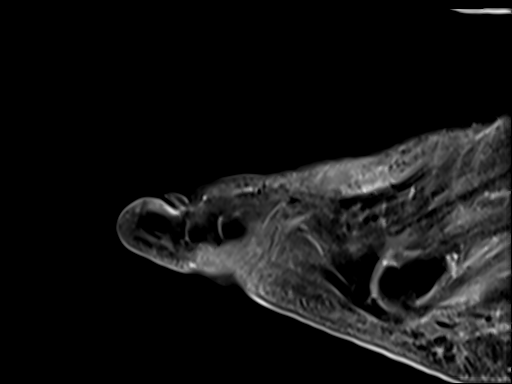
[im 15/30]
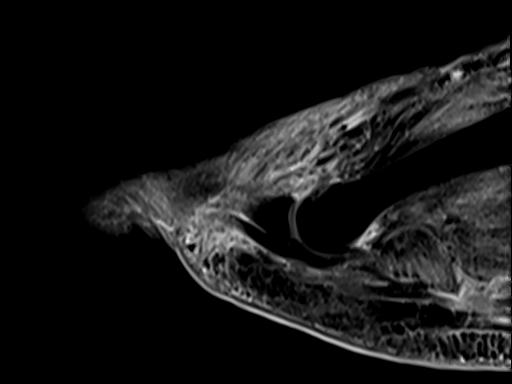
[im 22/30]
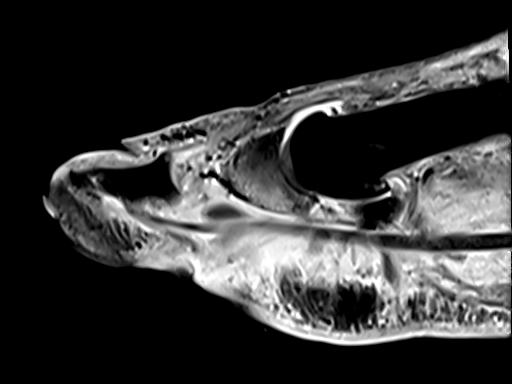
[im 30/30]
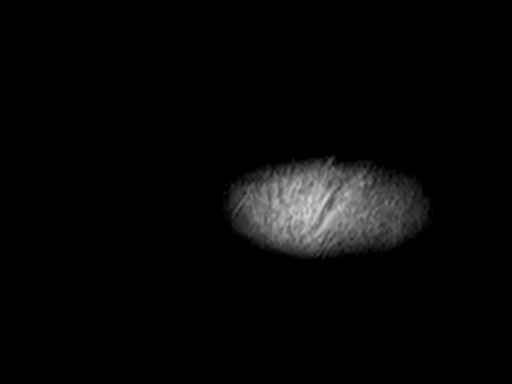

[Series 11: T1 fat-sat post-contrast · coronal · left · 3.0mm · 0.47mm/px · 4 of 26 slices shown (3 of 3)]
[im 1/26]
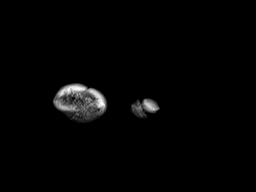
[im 9/26]
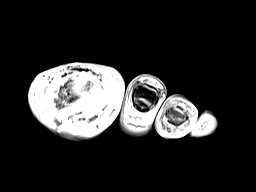
[im 17/26]
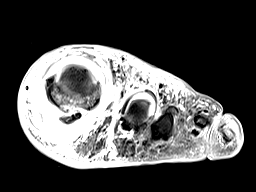
[im 26/26]
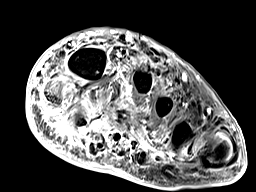

[40 of 40 positions shown; findings below may reference images not displayed]

FINDINGS: Bones/Joint/Cartilage

Postsurgical changes are seen from prior resection of the distal
margin of the first proximal phalanx. The remaining first proximal
phalanx demonstrates abnormal marrow edema consistent with
osteomyelitis.

The remaining visualized bony structures demonstrate normal signal
characteristics.

There is prominent osteoarthritis of the first metatarsophalangeal
joint.

Ligaments

No acute abnormalities.

Muscles and Tendons

There is fluid within the tendon sheath of the flexor hallucis
longus, consistent with tenosynovitis.

Soft tissues

There is diffuse edema within the first digit, with evidence of
focal ulceration plantar aspect at the level of the
metatarsophalangeal joint. There is diffuse abnormal enhancement
throughout the first digit plantar aspect. No fluid collection or
abscess.
IMPRESSION: 1. Abnormal marrow edema and enhancement within the residual
portions of the first proximal phalanx, consistent with
osteomyelitis given clinical presentation.
2. Ulceration, edema, and enhancement of the soft tissues plantar
aspect first digit, consistent with marked cellulitis. No fluid
collection or abscess.
3. Tenosynovitis of the flexor hallucis longus tendon.

## 2019-11-06 IMAGING — DX DG CHEST 1V PORT
2 series · 2 of 2 positions shown · non-contrast
Comparison: [DATE]

CLINICAL DATA: Fever

EXAM:
PORTABLE CHEST 1 VIEW

[chest ap (1 of 2)]
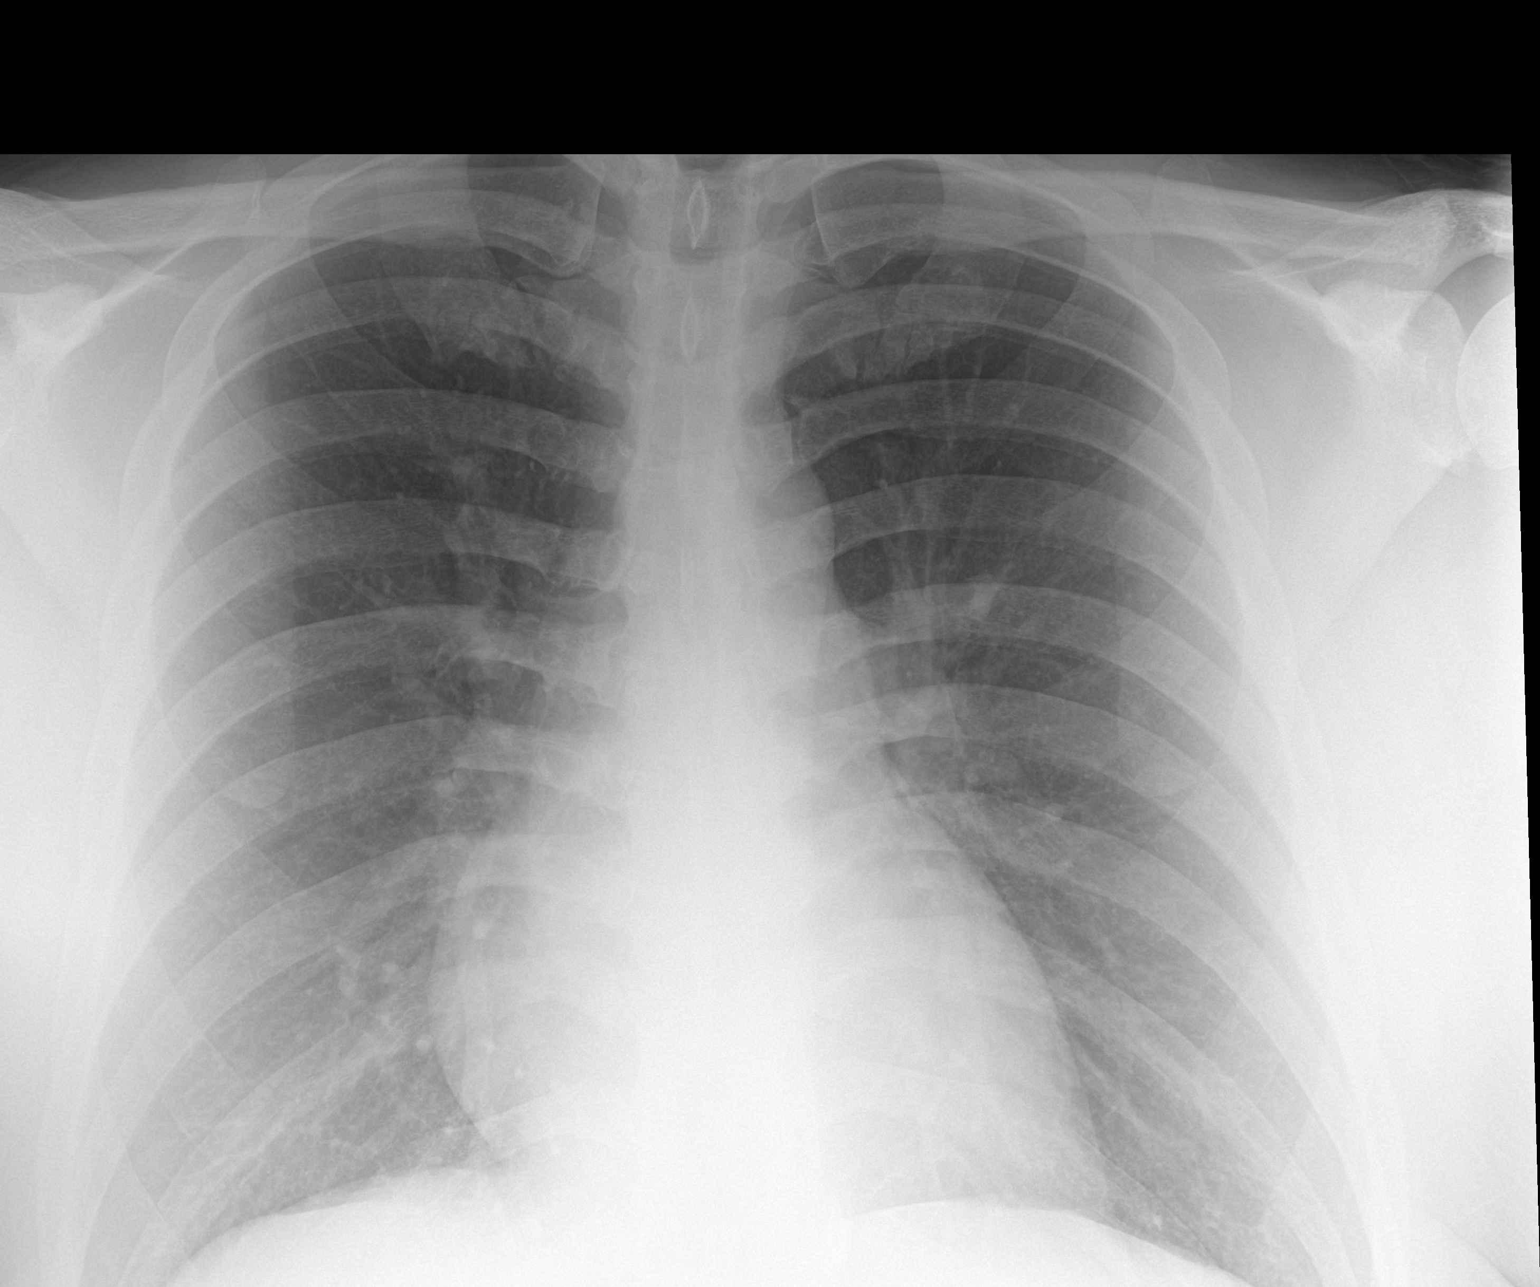

[chest ap (2 of 2)]
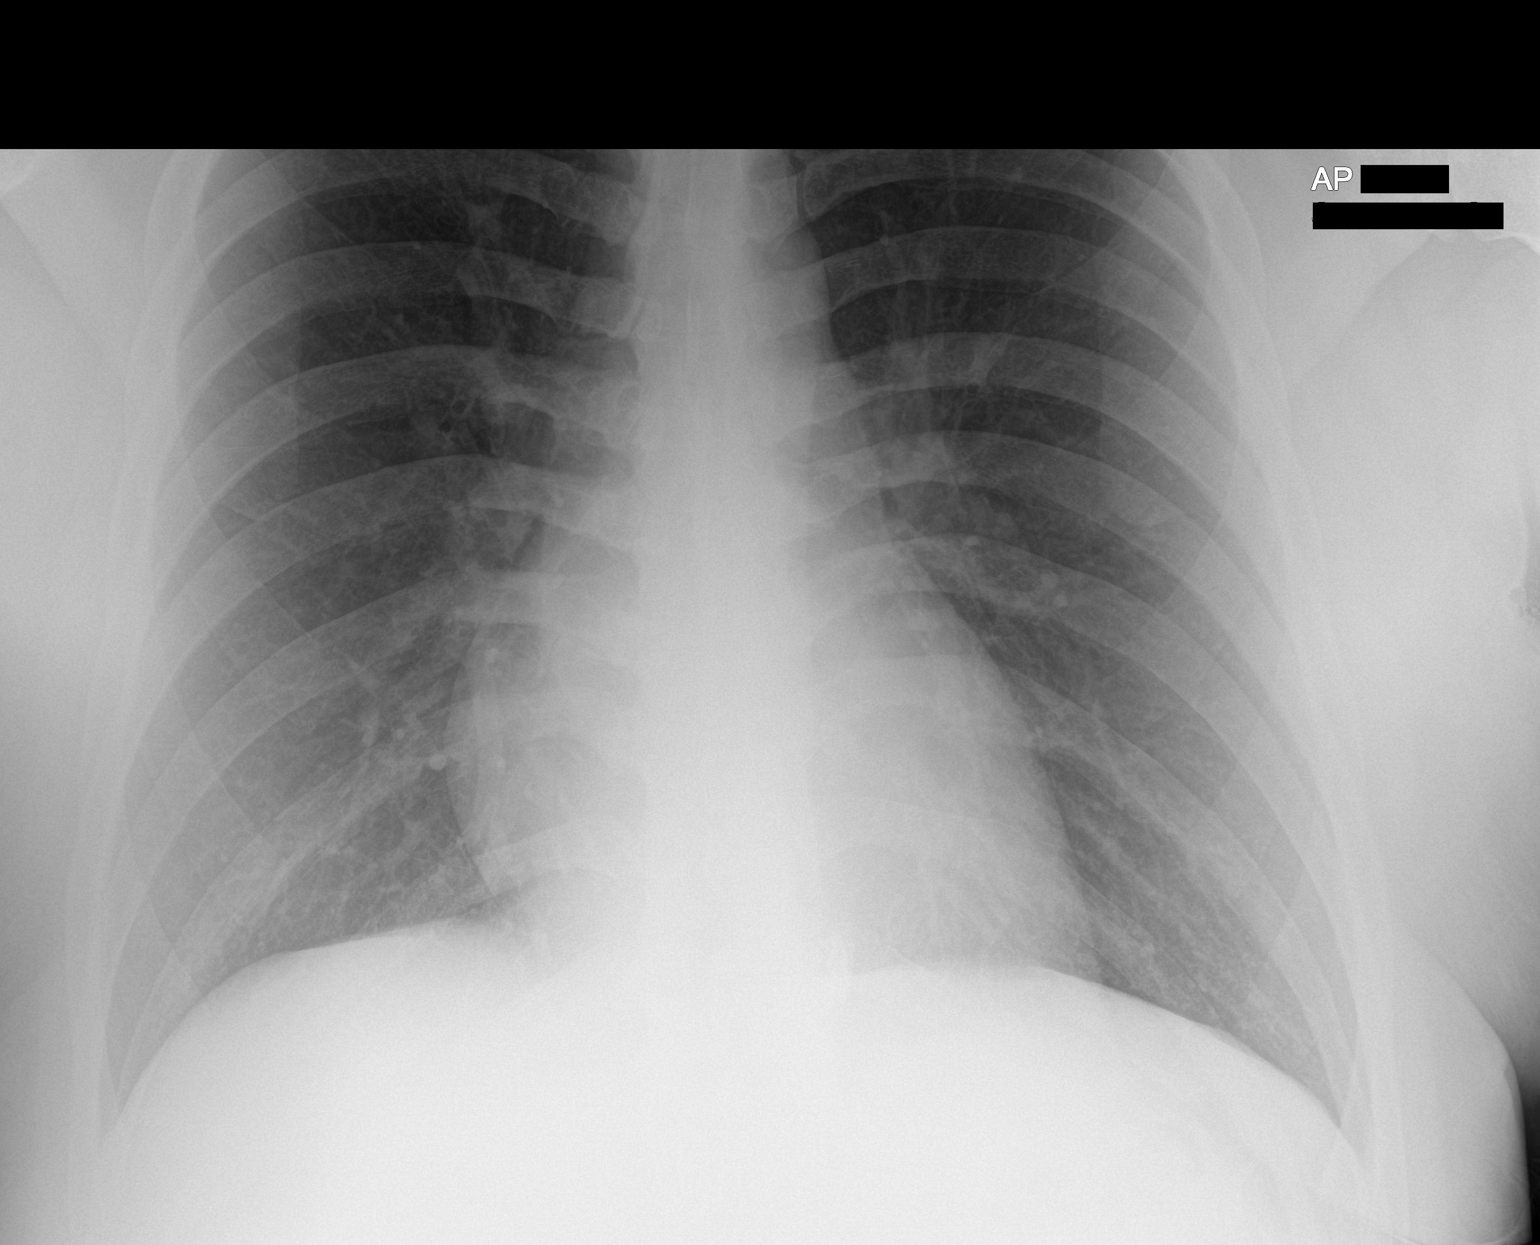

[2 of 2 positions shown; findings below may reference images not displayed]

FINDINGS: The heart size and mediastinal contours are within normal limits.
Both lungs are clear. The visualized skeletal structures are
unremarkable.
IMPRESSION: No active disease.

## 2019-11-06 MED ORDER — KETOROLAC TROMETHAMINE 15 MG/ML IJ SOLN: 15.0000 mg | Freq: Four times a day (QID) | INTRAMUSCULAR | Status: DC | PRN

## 2019-11-06 MED ORDER — VANCOMYCIN HCL 10 G IV SOLR
2500.0000 mg | Freq: Once | INTRAVENOUS | Status: AC
  Administered 2019-11-06: 2500 mg via INTRAVENOUS
  Filled 2019-11-06: qty 1000

## 2019-11-06 MED ORDER — ENOXAPARIN SODIUM 60 MG/0.6ML ~~LOC~~ SOLN
60.0000 mg | SUBCUTANEOUS | Status: DC
  Administered 2019-11-06 – 2019-11-08 (×3): 60 mg via SUBCUTANEOUS
  Filled 2019-11-06 (×3): qty 0.6

## 2019-11-06 MED ORDER — SODIUM CHLORIDE 0.9 % IV SOLN: INTRAVENOUS | Status: DC

## 2019-11-06 MED ORDER — ACETAMINOPHEN 325 MG PO TABS
650.0000 mg | ORAL_TABLET | Freq: Once | ORAL | Status: AC | PRN
  Administered 2019-11-06: 650 mg via ORAL
  Filled 2019-11-06: qty 2

## 2019-11-06 MED ORDER — SODIUM CHLORIDE 0.9 % IV BOLUS
1000.0000 mL | Freq: Once | INTRAVENOUS | Status: AC
  Administered 2019-11-06: 1000 mL via INTRAVENOUS

## 2019-11-06 MED ORDER — POLYETHYLENE GLYCOL 3350 17 G PO PACK: 17.0000 g | PACK | Freq: Every day | ORAL | Status: DC | PRN

## 2019-11-06 MED ORDER — NICOTINE 7 MG/24HR TD PT24
7.0000 mg | MEDICATED_PATCH | Freq: Every day | TRANSDERMAL | Status: DC
  Administered 2019-11-06: 7 mg via TRANSDERMAL
  Filled 2019-11-06 (×4): qty 1

## 2019-11-06 MED ORDER — SODIUM CHLORIDE 0.9 % IV SOLN
2.0000 g | Freq: Once | INTRAVENOUS | Status: AC
  Administered 2019-11-06: 2 g via INTRAVENOUS
  Filled 2019-11-06: qty 20

## 2019-11-06 MED ORDER — GADOBUTROL 1 MMOL/ML IV SOLN
10.0000 mL | Freq: Once | INTRAVENOUS | Status: AC | PRN
  Administered 2019-11-06: 10 mL via INTRAVENOUS

## 2019-11-06 MED ORDER — OXYCODONE-ACETAMINOPHEN 5-325 MG PO TABS
1.0000 | ORAL_TABLET | ORAL | Status: DC | PRN
  Administered 2019-11-07: 1 via ORAL
  Filled 2019-11-06 (×2): qty 1

## 2019-11-06 MED ORDER — VANCOMYCIN HCL IN DEXTROSE 1-5 GM/200ML-% IV SOLN: 1000.0000 mg | Freq: Once | INTRAVENOUS | Status: DC

## 2019-11-06 MED ORDER — METOPROLOL TARTRATE 5 MG/5ML IV SOLN: 5.0000 mg | Freq: Four times a day (QID) | INTRAVENOUS | Status: DC | PRN

## 2019-11-06 NOTE — ED Notes (Signed)
Pt to MRI

## 2019-11-06 NOTE — Progress Notes (Signed)
  Subjective:  Patient ID: Mark Stein, male    DOB: January 28, 1986,  MRN: [id number]  Chief Complaint  Patient presents with  . Wound Check    L hallux. Pt stated, "I've had a lot of pain since Saturday - 5/10 at rest, but 9/10 while walking. The pain is around my 1st/2nd toes, but it goes up to my ankle when I walk. Hot and swollen x1 wk. I had chills Saturday night. No pus. Foul odor after work - I don't notice it when I've been resting. I haven't worn my brace this week because of the swelling. Using a dressing without ointment".   34 y.o. male presents for wound care. Hx confirmed with patient. Endorses recent chills. Febrile today. Objective:   Vitals:   11/06/19 1030  BP: (!) 172/105  Pulse: (!) 111  Temp: (!) 101.1 F (38.4 C)     Physical Exam: Wound Location: left hallux Wound Measurement: 2x2 Wound Base: fibronecrotic Peri-wound: Calloused Exudate: Scant/small amount Serosanguinous exudate wound without warmth, erythema, signs of acute infection, + induration, + fluctuance, + tenderness and erythema noted along wound margins extending 3 cm     Radiographs:  X-ray of the left foot: no definite osseous erosion, soft tissue edema, no subq air. Assessment:   1. Skin ulcer of left foot with fat layer exposed (HCC)   2. Cellulitis and abscess of foot, except toes   3. Left foot drop    Plan:  Patient was evaluated and treated and all questions answered.  Ulcer left foot -XR reviewed with patient -Dressing applied consisting of betadine, sterile gauze, kerlix and ACE bandage -Offload ulcer with CAM boot -CAM boot dispensed  -Given infection with local signs of infection would benefit from admission to the hospital for IV abx and likely surgery. Will send patient to the ED for eval. -On admission patient will need MRI of the left foot. -Plan for surgical incision and drainage tomorrow.  Foot Drop -Still unclear etiology -Reviewed EMG findings with patient -Pending  MRI  No follow-ups on file.

## 2019-11-06 NOTE — ED Triage Notes (Signed)
Patient reports he went to [facility] and was sent here as they said he has an infected foot ulcer that is cellulitic.

## 2019-11-06 NOTE — H&P (View-Only) (Signed)
HPI  Mark Stein MWU:[id number] DOB: 12/12/1985 DOA: 11/06/2019  PCP: Patient, No Pcp Per   Chief Complaint: Pain fever and swelling of foot 58  HPI:  34 year old home dwelling African-American male Known history chronic hammering of toes status post right great to debridement closure with arthroplasty and antibiotic beads 10/16/2018 Tissue transfer on left side 02/05/2019 by Dr. March Rummage podiatry Left foot drop that may have started 2018 after fall tripping his and hurting his knee in 2018 Follows with Dr. Susa Raring of neurology (because of foot drop-patient was seen earlier this month and tells me that thought processes that patient may have had a lumbar sprain or strain causing foot drop bilaterally with an EMG and an MRI of the lower back being planned for later this month)  Former smoker current drinker Presented to podiatry office febrile (101.1) tachycardic (111) and was sent to ED by Dr. March Rummage  He is currently afebrile and in no distress and has somewhat insensate feet    Review of Systems:   Pertinent +'s: Fever, tachycardia oozing of lower extremity  Pertinent -"s: no rash no dark stool no tarry stool No unilateral weakness no recent fall no recent other illness Cannot recall last time his foot was "normal"  ED Course: Patient started on IV vancomycin and ceftriaxone given bolus 1000 cc IV fluid given Tylenol x1 placed on regular diet, 2 peripheral IVs placed   Past Medical History:  Diagnosis Date  . Medical history non-contributory    Past Surgical History:  Procedure Laterality Date  . EXCISION PARTIAL PHALANX Right 10/16/2018   Procedure: right great toe wound debridement and closure. arthroplasty packing antiobiotic beads. distal phlax chondrolectomy . bone biopsy.;  Surgeon: Evelina Bucy, DPM;  Location: [facility];  Service: Podiatry;  Laterality: Right;  . HAMMER TOE SURGERY Left 02/05/2019   Procedure: HAMMER TOE CORRECTION BIG TOE;  Surgeon: Evelina Bucy, DPM;   Location: [facility];  Service: Podiatry;  Laterality: Left;  Marland Kitchen MASS EXCISION Left 02/05/2019   Procedure: EXCISION BENIGN LESION 2.1-3.1CM;  Surgeon: Evelina Bucy, DPM;  Location: [facility];  Service: Podiatry;  Laterality: Left;  . NO PAST SURGERIES    . TENDON TRANSFER Left 02/05/2019   Procedure: ADJACENT TISSUE TRANSER LEFT;  Surgeon: Evelina Bucy, DPM;  Location: [facility];  Service: Podiatry;  Laterality: Left;    reports that he quit smoking about 4 years ago. His smoking use included cigars. He smoked 0.25 packs per day. He has never used smokeless tobacco. He reports current alcohol use. He reports current drug use. Frequency: 2.00 times per week. Drug: Marijuana.  Mobility: Patient is independent at baseline He works at a [employer] scientific [employer] all the time  He lives at home with his wife and child  No Known Allergies No family history on file. Prior to Admission medications   Medication Sig Start Date End Date Taking? Authorizing Provider  acetaminophen (TYLENOL) 500 MG tablet Take 500 mg by mouth every 6 (six) hours as needed for moderate pain.   Yes [provider]  collagenase (SANTYL) ointment Apply 1 application topically daily. Measurements to Left great toe 2.0 x 2.0 x 0.1cm Patient not taking: Reported on 10/17/2019 01/30/19   Evelina Bucy, DPM  oxyCODONE-acetaminophen (PERCOCET) 5-325 MG tablet Take 1 tablet by mouth every 4 (four) hours as needed for severe pain. Patient not taking: Reported on 10/17/2019 02/25/19   Evelina Bucy, DPM  silver sulfADIAZINE (SILVADENE) 1 %  cream Apply pea-sized amount to wound daily. Patient not taking: Reported on 10/17/2019 11/08/18   Evelina Bucy, DPM    Physical Exam:  Vitals:   11/06/19 1208 11/06/19 1530  BP: (!) 129/104 120/83  Pulse: (!) 118 (!) 109  Resp: 18 18  Temp: (!) 100.8 F (38.2 C)   SpO2: 97% 98%   Awake alert coherent no distress EOMI NCAT no focal deficit  Neck soft  supple  CTA B no added sound no rales no rhonchi  S1-S2 no murmur rub or gallop  Abdomen soft no rebound  Foot exam shows foot drop bilaterally with lack of  dorsiflexion but some plantar flexion present  Sensation is intact bilaterally in L2-3 and 4 distribution however it is diminished on the left side completely  Vision is intact to direct confrontation extraocular movements are intact  Power is 5/5 except to dorsiflexion as noted above  Skin exam patient has a 5 x 7 draining wound on the left foot with soft tissue edema no subcutaneous air  I have personally reviewed following labs and imaging studies  Labs:   White count 25  Hemoglobin 13.3  Platelet 353  BUNs/creatinine 11/0.7  Total bilirubin 1.4  Total protein 8.5  Imaging studies:   MRI pending  Medical tests:   EKG independently reviewed: None  Test discussed with performing physician:  Yes discussed in detail with Dr. Ronnald Nian of the emergency room  Decision to obtain old records:   Yes thoroughly reviewed charts  Review and summation of old records:   Yes this is been thoroughly reviewed  Active Problems:   Osteomyelitis ([place])   Assessment/Plan 1. Sepsis on admission (early) likely secondary to osteomyelitis of left forefoot with possible Charcot joint 1. Patient met septic criteria with elevated heart rate elevated temperature leukocytosis of 25 and code sepsis was called by the emergency room 2. Empiric ceftriaxone vancomycin at this time but narrow rapidly post procedure-would use cephalosporin in addition to vancomycin given this could be polymicrobial would try not to use Zosyn given synergistic risk of AKI AKI 3. obtain blood cultures and would ask that deep wound cultures be obtained to guide antibiotic therapy 4. Saline at rate of 100 cc/h 5. Definitive management per Dr. March Rummage podiatry and will keep n.p.o. after midnight 1. Bilateral foot drop 1. Will hold on MRI of lower back at  this time as would not change current planning and will await this in the outpatient setting with Dr. Susa Raring who I will CC with regards to the same-the patient does not have a PCP and will need to find 1 and we will attempt to get TOC to help with that planning 2. Reversal of albumin to globulin ratio 1. Probably related to chronic infection from his wounds 2. Needs outpatient repeat of LFTs as he also has slightly elevated bilirubin 3. BMI >35  1. Once able and recovered from multiple illnesses will need an exercise plan 4. Smoker 1. Patient counseled with regards to cessation as healing will be impaired-he does not ask for a smoking patch but we can provide if needed     Severity of Illness: The appropriate patient status for this patient is INPATIENT. Inpatient status is judged to be reasonable and necessary in order to provide the required intensity of service to ensure the patient's safety. The patient's presenting symptoms, physical exam findings, and initial radiographic and laboratory data in the context of their chronic comorbidities is felt to place them at high risk  for further clinical deterioration. Furthermore, it is not anticipated that the patient will be medically stable for discharge from the hospital within 2 midnights of admission. The following factors support the patient status of inpatient.   " The patient's presenting symptoms include infection swelling and also purulence. " The worrisome physical exam findings include purulence as well as warmth to the foot. " The initial radiographic and laboratory data are worrisome because of probable osteomyelitis. " The chronic co-morbidities include dropfoot obesity smoking.   * I certify that at the point of admission it is my clinical judgment that the patient will require inpatient hospital care spanning beyond 2 midnights from the point of admission due to high intensity of service, high risk for further deterioration and high  frequency of surveillance required.*   DVT prophylaxis: Lovenox Code Status: Full Family Communication: None at bedside Consults called: Dr. March Rummage will be seeing  Time spent: 24 minutes  Verlon Au, MD [days-call my NP partners at night for Care related issues] [facility] --Via amion app OR , [email or web address]; password [facility]  11/06/2019, 4:17 PM

## 2019-11-06 NOTE — Progress Notes (Signed)
A consult was received from an ED physician for vancomycin per pharmacy dosing.  The patient's profile has been reviewed for ht/wt/allergies/indication/available labs.   A one time order has been placed for vancomycin 2.5g.  Further antibiotics/pharmacy consults should be ordered by admitting physician if indicated.                       Thank you, Loralee Pacas, PharmD, BCPS Pharmacy: [phone] 11/06/2019  3:17 PM

## 2019-11-06 NOTE — ED Provider Notes (Addendum)
[facility]-EMERGENCY DEPT Provider Note   CSN: [id number] Arrival date & time: 11/06/19  1144     History Chief Complaint  Patient presents with  . Skin Ulcer    Mark Stein is a 34 y.o. male.  Patient with history of chronic osteo/infection in feet. Worsening left big toe pain with drainage. Sent from podiatry due to fever and concern for sepsis. Plan is to be admitted for IV antibiotics and likely OR. Doctor would like MRI to eval for osteo.   The history is provided by the patient.  Foot Pain This is a chronic problem. The current episode started more than 2 days ago. The problem occurs daily. The problem has been gradually worsening. Pertinent negatives include no chest pain, no abdominal pain, no headaches and no shortness of breath. Nothing aggravates the symptoms. Nothing relieves the symptoms. He has tried nothing for the symptoms. The treatment provided no relief.       Past Medical History:  Diagnosis Date  . Medical history non-contributory     Patient Active Problem List   Diagnosis Date Noted  . Chronic osteomyelitis of toe, left (HCC)   . Skin ulcer of toe of left foot with necrosis of bone (HCC)   . Acute osteomyelitis of toe, right (HCC)   . Ulcer of great toe, right, with necrosis of bone [facility](HCC)     Past Surgical History:  Procedure Laterality Date  . EXCISION PARTIAL PHALANX Right 10/16/2018   Procedure: right great toe wound debridement and closure. arthroplasty packing antiobiotic beads. distal phlax chondrolectomy . bone biopsy.;  Surgeon: Park LiterPrice, Michael J, DPM;  Location: [facility];  Service: Podiatry;  Laterality: Right;  . HAMMER TOE SURGERY Left 02/05/2019   Procedure: HAMMER TOE CORRECTION BIG TOE;  Surgeon: Park LiterPrice, Michael J, DPM;  Location: [facility];  Service: Podiatry;  Laterality: Left;  Marland Kitchen. MASS EXCISION Left 02/05/2019   Procedure: EXCISION BENIGN LESION 2.1-3.1CM;  Surgeon: Park LiterPrice, Michael J, DPM;  Location: [facility];  Service:  Podiatry;  Laterality: Left;  . NO PAST SURGERIES    . TENDON TRANSFER Left 02/05/2019   Procedure: ADJACENT TISSUE TRANSER LEFT;  Surgeon: Park LiterPrice, Michael J, DPM;  Location: [facility];  Service: Podiatry;  Laterality: Left;       No family history on file.  Social History   Tobacco Use  . Smoking status: Former Smoker    Packs/day: 0.25    Types: Cigars    Quit date: 08/05/2015    Years since quitting: 4.2  . Smokeless tobacco: Never Used  Vaping Use  . Vaping Use: Never used  Substance Use Topics  . Alcohol use: Yes    Comment: 2-3 drinks a week   . Drug use: Yes    Frequency: 2.0 times per week    Types: Marijuana    Comment: last use yesterday ;     Home Medications Prior to Admission medications   Medication Sig Start Date End Date Taking? Authorizing Provider  acetaminophen (TYLENOL) 500 MG tablet Take 500 mg by mouth every 6 (six) hours as needed for moderate pain.   Yes [provider]  collagenase (SANTYL) ointment Apply 1 application topically daily. Measurements to Left great toe 2.0 x 2.0 x 0.1cm Patient not taking: Reported on 10/17/2019 01/30/19   Park LiterPrice, Michael J, DPM  oxyCODONE-acetaminophen (PERCOCET) 5-325 MG tablet Take 1 tablet by mouth every 4 (four) hours as needed for severe pain. Patient not taking: Reported on 10/17/2019 02/25/19  Park Liter, DPM  silver sulfADIAZINE (SILVADENE) 1 % cream Apply pea-sized amount to wound daily. Patient not taking: Reported on 10/17/2019 11/08/18   Park Liter, DPM    Allergies    Patient has no known allergies.  Review of Systems   Review of Systems  Constitutional: Negative for chills and fever.  HENT: Negative for ear pain and sore throat.   Eyes: Negative for pain and visual disturbance.  Respiratory: Negative for cough and shortness of breath.   Cardiovascular: Negative for chest pain and palpitations.  Gastrointestinal: Negative for abdominal pain and vomiting.  Genitourinary: Negative for  dysuria and hematuria.  Musculoskeletal: Negative for arthralgias and back pain.  Skin: Positive for color change and wound. Negative for rash.  Neurological: Negative for seizures, syncope and headaches.  All other systems reviewed and are negative.   Physical Exam Updated Vital Signs BP 120/83   Pulse (!) 109   Temp (!) 100.8 F (38.2 C) (Oral)   Resp 18   Ht 6' (1.829 m)   Wt 130.2 kg   SpO2 98%   BMI 38.92 kg/m   Physical Exam Vitals and nursing note reviewed.  Constitutional:      General: He is not in acute distress.    Appearance: He is well-developed. He is not ill-appearing.  HENT:     Head: Normocephalic and atraumatic.     Nose: Nose normal.     Mouth/Throat:     Mouth: Mucous membranes are moist.  Eyes:     Extraocular Movements: Extraocular movements intact.     Conjunctiva/sclera: Conjunctivae normal.     Pupils: Pupils are equal, round, and reactive to light.  Cardiovascular:     Rate and Rhythm: Regular rhythm. Tachycardia present.     Pulses: Normal pulses.     Heart sounds: No murmur heard.   Pulmonary:     Effort: Pulmonary effort is normal. No respiratory distress.     Breath sounds: Normal breath sounds.  Abdominal:     Palpations: Abdomen is soft.     Tenderness: There is no abdominal tenderness.  Musculoskeletal:        General: No tenderness.     Cervical back: Normal range of motion and neck supple.     Comments: No midline spinal pain  Skin:    General: Skin is warm and dry.     Comments: Ulcer to the bottom of the left big toe with surrounding swelling and warmth and color change   Neurological:     General: No focal deficit present.     Mental Status: He is alert.     Sensory: No sensory deficit.     Comments: Weakness and dorsiflexion bilaterally but 5+ out of 5 strength throughout otherwise and sensation in lower extremities appears intact      ED Results / Procedures / Treatments   Labs (all labs ordered are listed, but  only abnormal results are displayed) Labs Reviewed  COMPREHENSIVE METABOLIC PANEL - Abnormal; Notable for the following components:      Result Value   Glucose, Bld 100 (*)    Calcium 8.8 (*)    Total Protein 8.5 (*)    Total Bilirubin 1.4 (*)    All other components within normal limits  CBC WITH DIFFERENTIAL/PLATELET - Abnormal; Notable for the following components:   WBC 25.4 (*)    Neutro Abs 22.3 (*)    Monocytes Absolute 1.5 (*)    Abs Immature Granulocytes 0.23 (*)  All other components within normal limits  CULTURE, BLOOD (ROUTINE X 2)  CULTURE, BLOOD (ROUTINE X 2)  SARS CORONAVIRUS 2 BY RT PCR (HOSPITAL ORDER, PERFORMED IN [facility])  URINE CULTURE  LACTIC ACID, PLASMA  PROTIME-INR  APTT  LACTIC ACID, PLASMA  URINALYSIS, ROUTINE W REFLEX MICROSCOPIC    EKG None  Radiology DG Chest Portable 1 View  Result Date: 11/06/2019 CLINICAL DATA:  Fever EXAM: PORTABLE CHEST 1 VIEW COMPARISON:  02/11/2018 FINDINGS: The heart size and mediastinal contours are within normal limits. Both lungs are clear. The visualized skeletal structures are unremarkable. IMPRESSION: No active disease. Electronically Signed   By: Helyn Numbers MD   On: 11/06/2019 15:53   DG Foot Complete Left  Result Date: 11/06/2019 Please see detailed radiograph report in office note.   Procedures .Critical Care Performed by: Virgina Norfolk, DO Authorized by: Virgina Norfolk, DO   Critical care provider statement:    Critical care time (minutes):  35   Critical care was necessary to treat or prevent imminent or life-threatening deterioration of the following conditions:  Sepsis   Critical care was time spent personally by me on the following activities:  Blood draw for specimens, development of treatment plan with patient or surrogate, discussions with primary provider, evaluation of patient's response to treatment, examination of patient, obtaining history from patient or surrogate,  ordering and performing treatments and interventions, ordering and review of laboratory studies, ordering and review of radiographic studies, pulse oximetry, re-evaluation of patient's condition and review of old charts   I assumed direction of critical care for this patient from another provider in my specialty: no     (including critical care time)  Medications Ordered in ED Medications  vancomycin (VANCOCIN) 2,500 mg in sodium chloride 0.9 % 500 mL IVPB (has no administration in time range)  acetaminophen (TYLENOL) tablet 650 mg (650 mg Oral Given 11/06/19 1451)  sodium chloride 0.9 % bolus 1,000 mL (1,000 mLs Intravenous Bolus from Bag 11/06/19 1544)  cefTRIAXone (ROCEPHIN) 2 g in sodium chloride 0.9 % 100 mL IVPB (2 g Intravenous New Bag/Given 11/06/19 1535)    ED Course  I have reviewed the triage vital signs and the nursing notes.  Pertinent labs & imaging results that were available during my care of the patient were reviewed by me and considered in my medical decision making (see chart for details).    MDM Rules/Calculators/A&P                          Mark Stein is a 34 year old male with history of osteomyelitis of his feet who presents the ED with foot infection from podiatry office.  Patient febrile and tachycardic.  Sent for admission for IV antibiotics due to concern for sepsis.  Patient follows with Ventura Sellers with podiatry.  His note recommends admission for IV antibiotics and MRI of the foot to evaluate for osteomyelitis.  He plans to take patient to the OR for a washout tomorrow.  No history of diabetes.  Has had bilateral foot drop for several months and is being followed by neurology.  Is scheduled for EMG and MRI.  Does not have any active back pain.  Denies any IV drug use.  Patient has good pulses in his feet bilaterally.  He has ulcer to the bottom of his left big toe with warmth, drainage, swelling.  Likely cellulitis versus osteomyelitis.  Will start broad-spectrum  IV antibiotics.  Sepsis protocol  initiated.  Patient with leukocytosis of 25.  Normal lactic acid.  Blood sugar normal.  Otherwise lab work unremarkable.  Chest x-ray without any signs of infection.  Suspect sepsis from foot infection.  To be admitted to medicine service.  Podiatry to see the patient in the morning.  Will obtain MRI.  This chart was dictated using voice recognition software.  Despite best efforts to proofread,  errors can occur which can change the documentation meaning.    Final Clinical Impression(s) / ED Diagnoses Final diagnoses:  Cellulitis of other specified site  Sepsis, due to unspecified organism, unspecified whether acute organ dysfunction present [facility])    Rx / DC Orders ED Discharge Orders    None       Virgina Norfolk, DO 11/06/19 1608    Virgina Norfolk, DO 11/06/19 1614

## 2019-11-06 NOTE — ED Notes (Signed)
Blood cultures second set drawn at 1534, first antibiotic started at 1535.

## 2019-11-06 NOTE — H&P (Addendum)
HPI  Mark Stein QKM:[id number] DOB: 11/15/1985 DOA: 11/06/2019  PCP: Patient, No Pcp Per   Chief Complaint: Pain fever and swelling of foot 65  HPI:  34 year old home dwelling African-American male Known history chronic hammering of toes status post right great to debridement closure with arthroplasty and antibiotic beads 10/16/2018 Tissue transfer on left side 02/05/2019 by Dr. March Rummage podiatry Left foot drop that may have started 2018 after fall tripping his and hurting his knee in 2018 Follows with Dr. Susa Raring of neurology (because of foot drop-patient was seen earlier this month and tells me that thought processes that patient may have had a lumbar sprain or strain causing foot drop bilaterally with an EMG and an MRI of the lower back being planned for later this month)  Former smoker current drinker Presented to podiatry office febrile (101.1) tachycardic (111) and was sent to ED by Dr. March Rummage  He is currently afebrile and in no distress and has somewhat insensate feet    Review of Systems:   Pertinent +'s: Fever, tachycardia oozing of lower extremity  Pertinent -"s: no rash no dark stool no tarry stool No unilateral weakness no recent fall no recent other illness Cannot recall last time his foot was "normal"  ED Course: Patient started on IV vancomycin and ceftriaxone given bolus 1000 cc IV fluid given Tylenol x1 placed on regular diet, 2 peripheral IVs placed   Past Medical History:  Diagnosis Date   Medical history non-contributory    Past Surgical History:  Procedure Laterality Date   EXCISION PARTIAL PHALANX Right 10/16/2018   Procedure: right great toe wound debridement and closure. arthroplasty packing antiobiotic beads. distal phlax chondrolectomy . bone biopsy.;  Surgeon: Evelina Bucy, DPM;  Location: [facility];  Service: Podiatry;  Laterality: Right;   HAMMER TOE SURGERY Left 02/05/2019   Procedure: HAMMER TOE CORRECTION BIG TOE;  Surgeon: Evelina Bucy, DPM;   Location: [facility];  Service: Podiatry;  Laterality: Left;   MASS EXCISION Left 02/05/2019   Procedure: EXCISION BENIGN LESION 2.1-3.1CM;  Surgeon: Evelina Bucy, DPM;  Location: [facility];  Service: Podiatry;  Laterality: Left;   NO PAST SURGERIES     TENDON TRANSFER Left 02/05/2019   Procedure: ADJACENT TISSUE TRANSER LEFT;  Surgeon: Evelina Bucy, DPM;  Location: [facility];  Service: Podiatry;  Laterality: Left;    reports that he quit smoking about 4 years ago. His smoking use included cigars. He smoked 0.25 packs per day. He has never used smokeless tobacco. He reports current alcohol use. He reports current drug use. Frequency: 2.00 times per week. Drug: Marijuana.  Mobility: Patient is independent at baseline He works at a [employer] scientific [employer] all the time  He lives at home with his wife and child  No Known Allergies No family history on file. Prior to Admission medications   Medication Sig Start Date End Date Taking? Authorizing Provider  acetaminophen (TYLENOL) 500 MG tablet Take 500 mg by mouth every 6 (six) hours as needed for moderate pain.   Yes [provider]  collagenase (SANTYL) ointment Apply 1 application topically daily. Measurements to Left great toe 2.0 x 2.0 x 0.1cm Patient not taking: Reported on 10/17/2019 01/30/19   Evelina Bucy, DPM  oxyCODONE-acetaminophen (PERCOCET) 5-325 MG tablet Take 1 tablet by mouth every 4 (four) hours as needed for severe pain. Patient not taking: Reported on 10/17/2019 02/25/19   Evelina Bucy, DPM  silver sulfADIAZINE (SILVADENE) 1 %  cream Apply pea-sized amount to wound daily. Patient not taking: Reported on 10/17/2019 11/08/18   Evelina Bucy, DPM    Physical Exam:  Vitals:   11/06/19 1208 11/06/19 1530  BP: (!) 129/104 120/83  Pulse: (!) 118 (!) 109  Resp: 18 18  Temp: (!) 100.8 F (38.2 C)   SpO2: 97% 98%   Awake alert coherent no distress EOMI NCAT no focal deficit  Neck soft  supple  CTA B no added sound no rales no rhonchi  S1-S2 no murmur rub or gallop  Abdomen soft no rebound  Foot exam shows foot drop bilaterally with lack of  dorsiflexion but some plantar flexion present  Sensation is intact bilaterally in L2-3 and 4 distribution however it is diminished on the left side completely  Vision is intact to direct confrontation extraocular movements are intact  Power is 5/5 except to dorsiflexion as noted above  Skin exam patient has a 5 x 7 draining wound on the left foot with soft tissue edema no subcutaneous air  I have personally reviewed following labs and imaging studies  Labs:   White count 25  Hemoglobin 13.3  Platelet 353  BUNs/creatinine 11/0.7  Total bilirubin 1.4  Total protein 8.5  Imaging studies:   MRI pending  Medical tests:   EKG independently reviewed: None  Test discussed with performing physician:  Yes discussed in detail with Dr. Ronnald Nian of the emergency room  Decision to obtain old records:   Yes thoroughly reviewed charts  Review and summation of old records:   Yes this is been thoroughly reviewed  Active Problems:   Osteomyelitis ([place])   Assessment/Plan 1. Sepsis on admission (early) likely secondary to osteomyelitis of left forefoot with possible Charcot joint 1. Patient met septic criteria with elevated heart rate elevated temperature leukocytosis of 25 and code sepsis was called by the emergency room 2. Empiric ceftriaxone vancomycin at this time but narrow rapidly post procedure-would use cephalosporin in addition to vancomycin given this could be polymicrobial would try not to use Zosyn given synergistic risk of AKI AKI 3. obtain blood cultures and would ask that deep wound cultures be obtained to guide antibiotic therapy 4. Saline at rate of 100 cc/h 5. Definitive management per Dr. March Rummage podiatry and will keep n.p.o. after midnight 1. Bilateral foot drop 1. Will hold on MRI of lower back at  this time as would not change current planning and will await this in the outpatient setting with Dr. Susa Raring who I will CC with regards to the same-the patient does not have a PCP and will need to find 1 and we will attempt to get TOC to help with that planning 2. Reversal of albumin to globulin ratio 1. Probably related to chronic infection from his wounds 2. Needs outpatient repeat of LFTs as he also has slightly elevated bilirubin 3. BMI >35  1. Once able and recovered from multiple illnesses will need an exercise plan 4. Smoker 1. Patient counseled with regards to cessation as healing will be impaired-he does not ask for a smoking patch but we can provide if needed     Severity of Illness: The appropriate patient status for this patient is INPATIENT. Inpatient status is judged to be reasonable and necessary in order to provide the required intensity of service to ensure the patient's safety. The patient's presenting symptoms, physical exam findings, and initial radiographic and laboratory data in the context of their chronic comorbidities is felt to place them at high risk  for further clinical deterioration. Furthermore, it is not anticipated that the patient will be medically stable for discharge from the hospital within 2 midnights of admission. The following factors support the patient status of inpatient.   " The patient's presenting symptoms include infection swelling and also purulence. " The worrisome physical exam findings include purulence as well as warmth to the foot. " The initial radiographic and laboratory data are worrisome because of probable osteomyelitis. " The chronic co-morbidities include dropfoot obesity smoking.   * I certify that at the point of admission it is my clinical judgment that the patient will require inpatient hospital care spanning beyond 2 midnights from the point of admission due to high intensity of service, high risk for further deterioration and high  frequency of surveillance required.*   DVT prophylaxis: Lovenox Code Status: Full Family Communication: None at bedside Consults called: Dr. March Rummage will be seeing  Time spent: 59 minutes  Verlon Au, MD [days-call my NP partners at night for Care related issues] [facility] --Via amion app OR , [email or web address]; password [facility]  11/06/2019, 4:17 PM

## 2019-11-07 ENCOUNTER — Inpatient Hospital Stay (HOSPITAL_COMMUNITY): Payer: BC Managed Care – PPO | Admitting: Anesthesiology

## 2019-11-07 ENCOUNTER — Inpatient Hospital Stay (HOSPITAL_COMMUNITY): Payer: BC Managed Care – PPO

## 2019-11-07 ENCOUNTER — Encounter (HOSPITAL_COMMUNITY): Payer: Self-pay | Admitting: Family Medicine

## 2019-11-07 ENCOUNTER — Encounter (HOSPITAL_COMMUNITY)
Admission: EM | Disposition: A | Discharge status: Home / Self Care | Payer: Self-pay | Source: Home / Self Care | Attending: Family Medicine

## 2019-11-07 DIAGNOSIS — L02619 Cutaneous abscess of unspecified foot: Secondary | ICD-10-CM

## 2019-11-07 DIAGNOSIS — L97524 Non-pressure chronic ulcer of other part of left foot with necrosis of bone: Secondary | ICD-10-CM

## 2019-11-07 DIAGNOSIS — L03119 Cellulitis of unspecified part of limb: Secondary | ICD-10-CM

## 2019-11-07 DIAGNOSIS — M86172 Other acute osteomyelitis, left ankle and foot: Secondary | ICD-10-CM

## 2019-11-07 DIAGNOSIS — L03818 Cellulitis of other sites: Secondary | ICD-10-CM

## 2019-11-07 DIAGNOSIS — L0390 Cellulitis, unspecified: Secondary | ICD-10-CM

## 2019-11-07 HISTORY — PX: I & D EXTREMITY: SHX5045

## 2019-11-07 LAB — CBC WITH DIFFERENTIAL/PLATELET
Abs Immature Granulocytes: 0.17 10*3/uL — ABNORMAL HIGH (ref 0.00–0.07)
Basophils Absolute: 0.1 10*3/uL (ref 0.0–0.1)
Basophils Relative: 1 %
Eosinophils Absolute: 0.2 10*3/uL (ref 0.0–0.5)
Eosinophils Relative: 1 %
HCT: 35.3 % — ABNORMAL LOW (ref 39.0–52.0)
Hemoglobin: 11.5 g/dL — ABNORMAL LOW (ref 13.0–17.0)
Immature Granulocytes: 1 %
Lymphocytes Relative: 9 %
Lymphs Abs: 1.7 10*3/uL (ref 0.7–4.0)
MCH: 29.7 pg (ref 26.0–34.0)
MCHC: 32.6 g/dL (ref 30.0–36.0)
MCV: 91.2 fL (ref 80.0–100.0)
Monocytes Absolute: 1.4 10*3/uL — ABNORMAL HIGH (ref 0.1–1.0)
Monocytes Relative: 7 %
Neutro Abs: 15.9 10*3/uL — ABNORMAL HIGH (ref 1.7–7.7)
Neutrophils Relative %: 81 %
Platelets: 322 10*3/uL (ref 150–400)
RBC: 3.87 MIL/uL — ABNORMAL LOW (ref 4.22–5.81)
RDW: 12.5 % (ref 11.5–15.5)
WBC: 19.3 10*3/uL — ABNORMAL HIGH (ref 4.0–10.5)
nRBC: 0 % (ref 0.0–0.2)

## 2019-11-07 LAB — SURGICAL PCR SCREEN
MRSA, PCR: NEGATIVE
Staphylococcus aureus: NEGATIVE

## 2019-11-07 LAB — COMPREHENSIVE METABOLIC PANEL
ALT: 21 U/L (ref 0–44)
AST: 14 U/L — ABNORMAL LOW (ref 15–41)
Albumin: 3.1 g/dL — ABNORMAL LOW (ref 3.5–5.0)
Alkaline Phosphatase: 55 U/L (ref 38–126)
Anion gap: 10 (ref 5–15)
BUN: 10 mg/dL (ref 6–20)
CO2: 24 mmol/L (ref 22–32)
Calcium: 8.4 mg/dL — ABNORMAL LOW (ref 8.9–10.3)
Chloride: 101 mmol/L (ref 98–111)
Creatinine, Ser: 0.79 mg/dL (ref 0.61–1.24)
GFR calc Af Amer: >60 mL/min (ref >60)
GFR calc non Af Amer: >60 mL/min (ref >60)
Glucose, Bld: 94 mg/dL (ref 70–99)
Potassium: 3.4 mmol/L — ABNORMAL LOW (ref 3.5–5.1)
Sodium: 135 mmol/L (ref 135–145)
Total Bilirubin: 1.4 mg/dL — ABNORMAL HIGH (ref 0.3–1.2)
Total Protein: 7 g/dL (ref 6.5–8.1)

## 2019-11-07 LAB — HIV ANTIBODY (ROUTINE TESTING W REFLEX): HIV Screen 4th Generation wRfx: NONREACTIVE

## 2019-11-07 IMAGING — DX DG FOOT COMPLETE 3+V*L*
3 series · 3 of 3 positions shown · non-contrast
Comparison: [DATE]

CLINICAL DATA: Status post I and D

EXAM:
LEFT FOOT - COMPLETE 3+ VIEW

[foot ap]
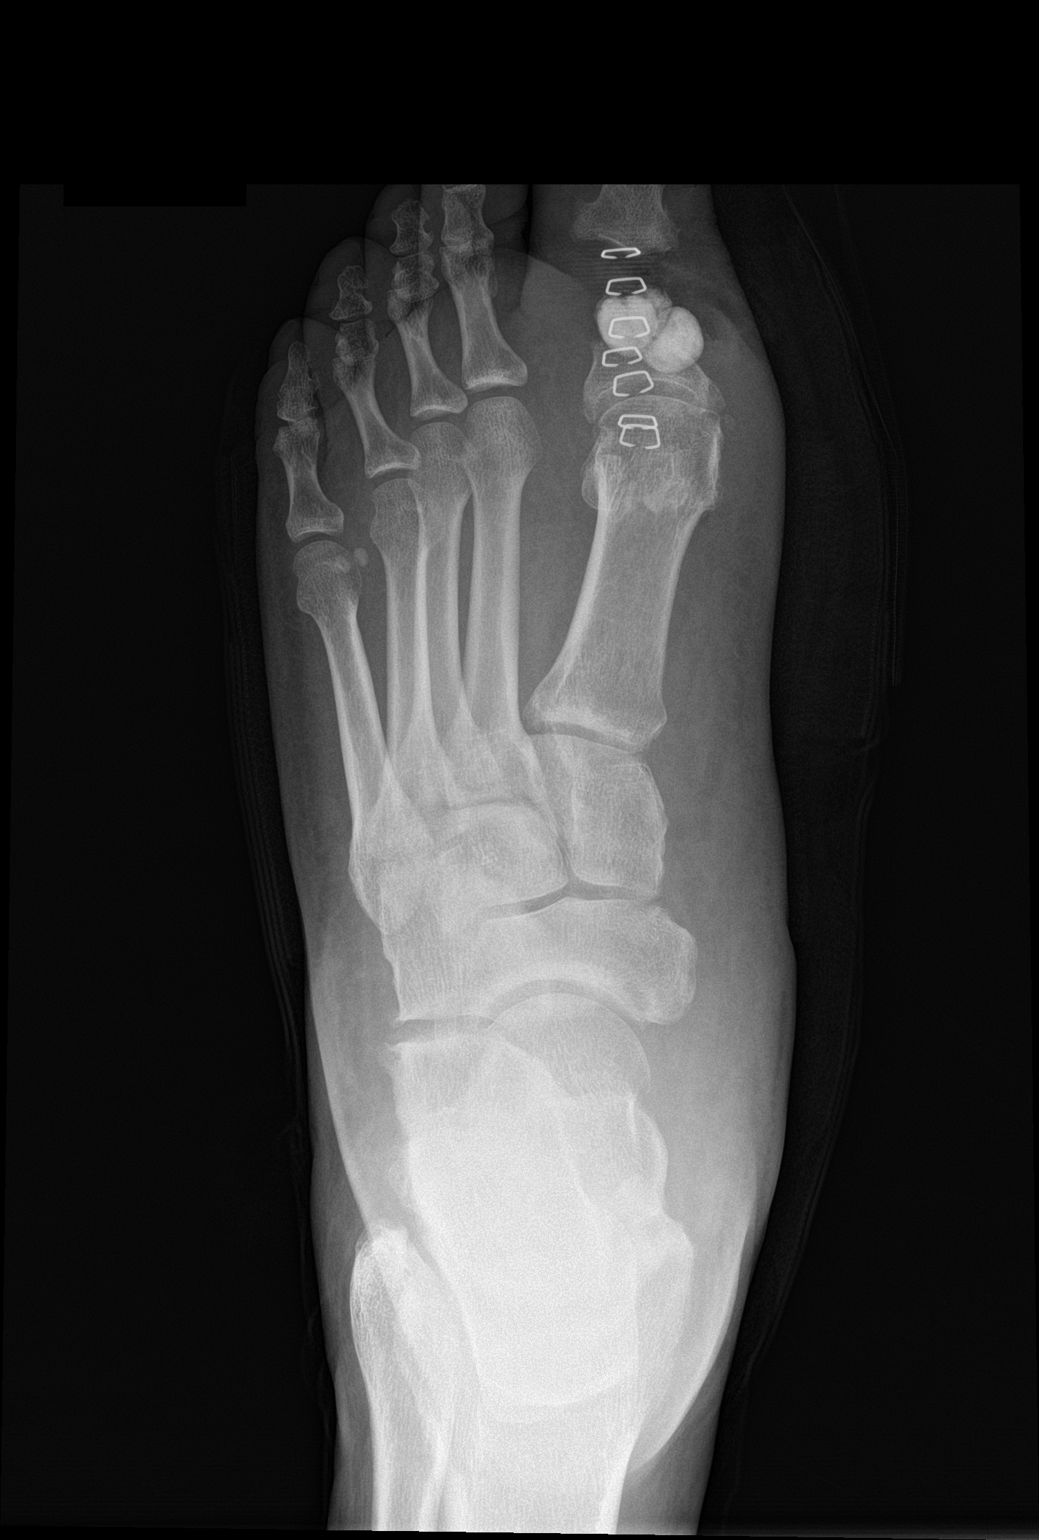

[foot obl]
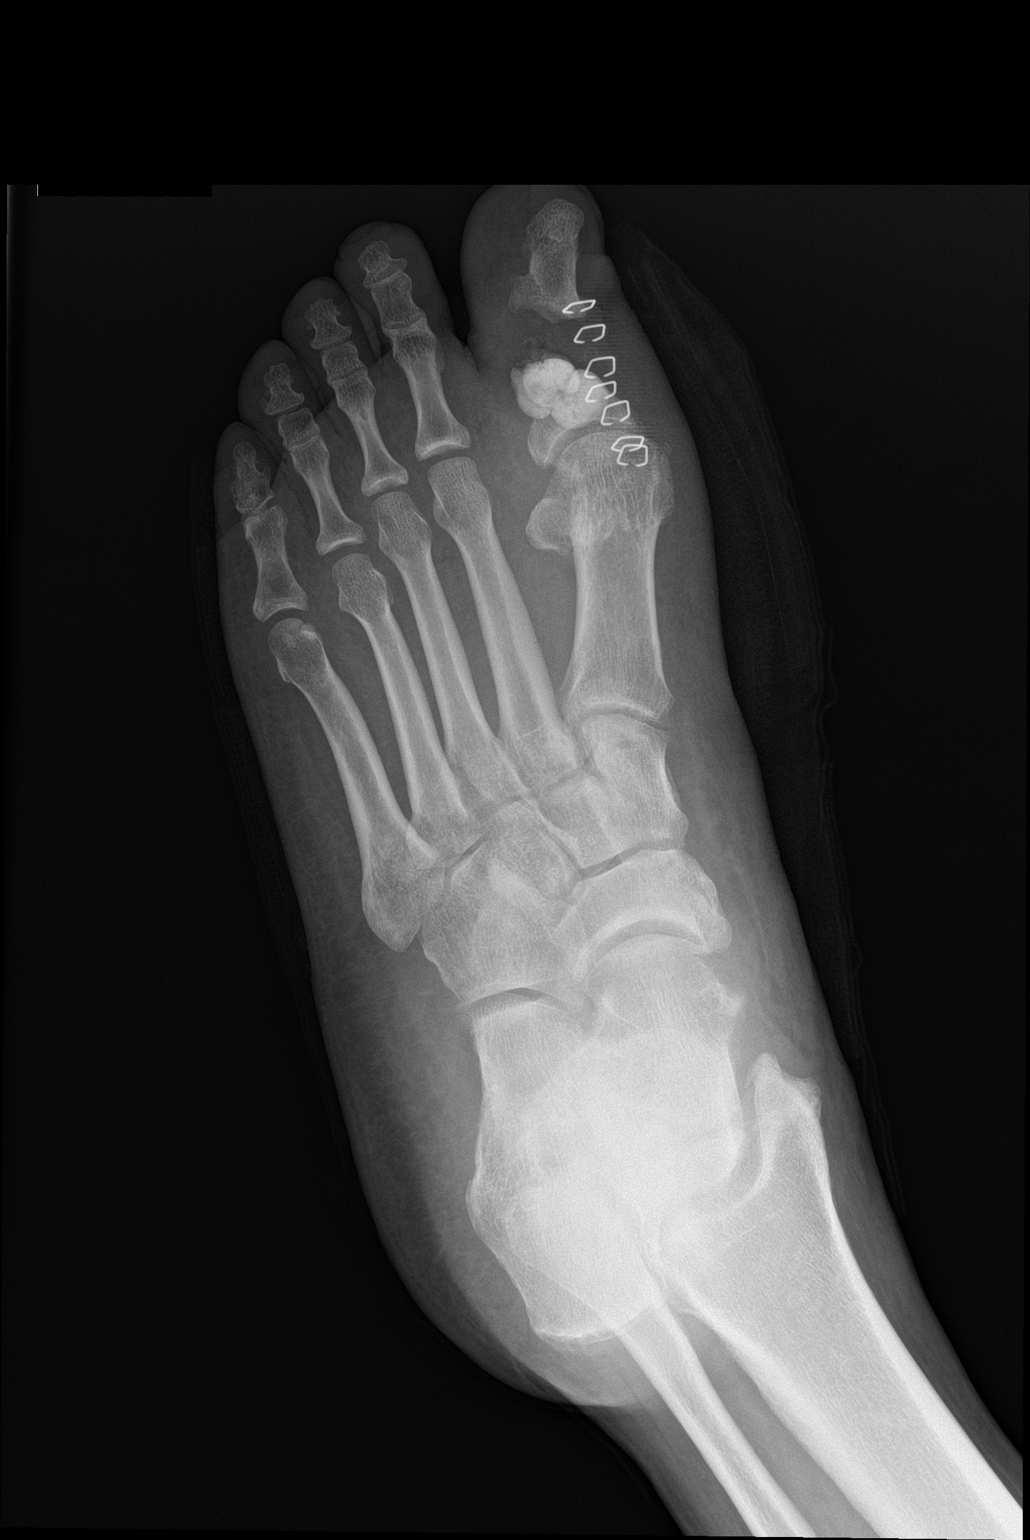

[foot lat]
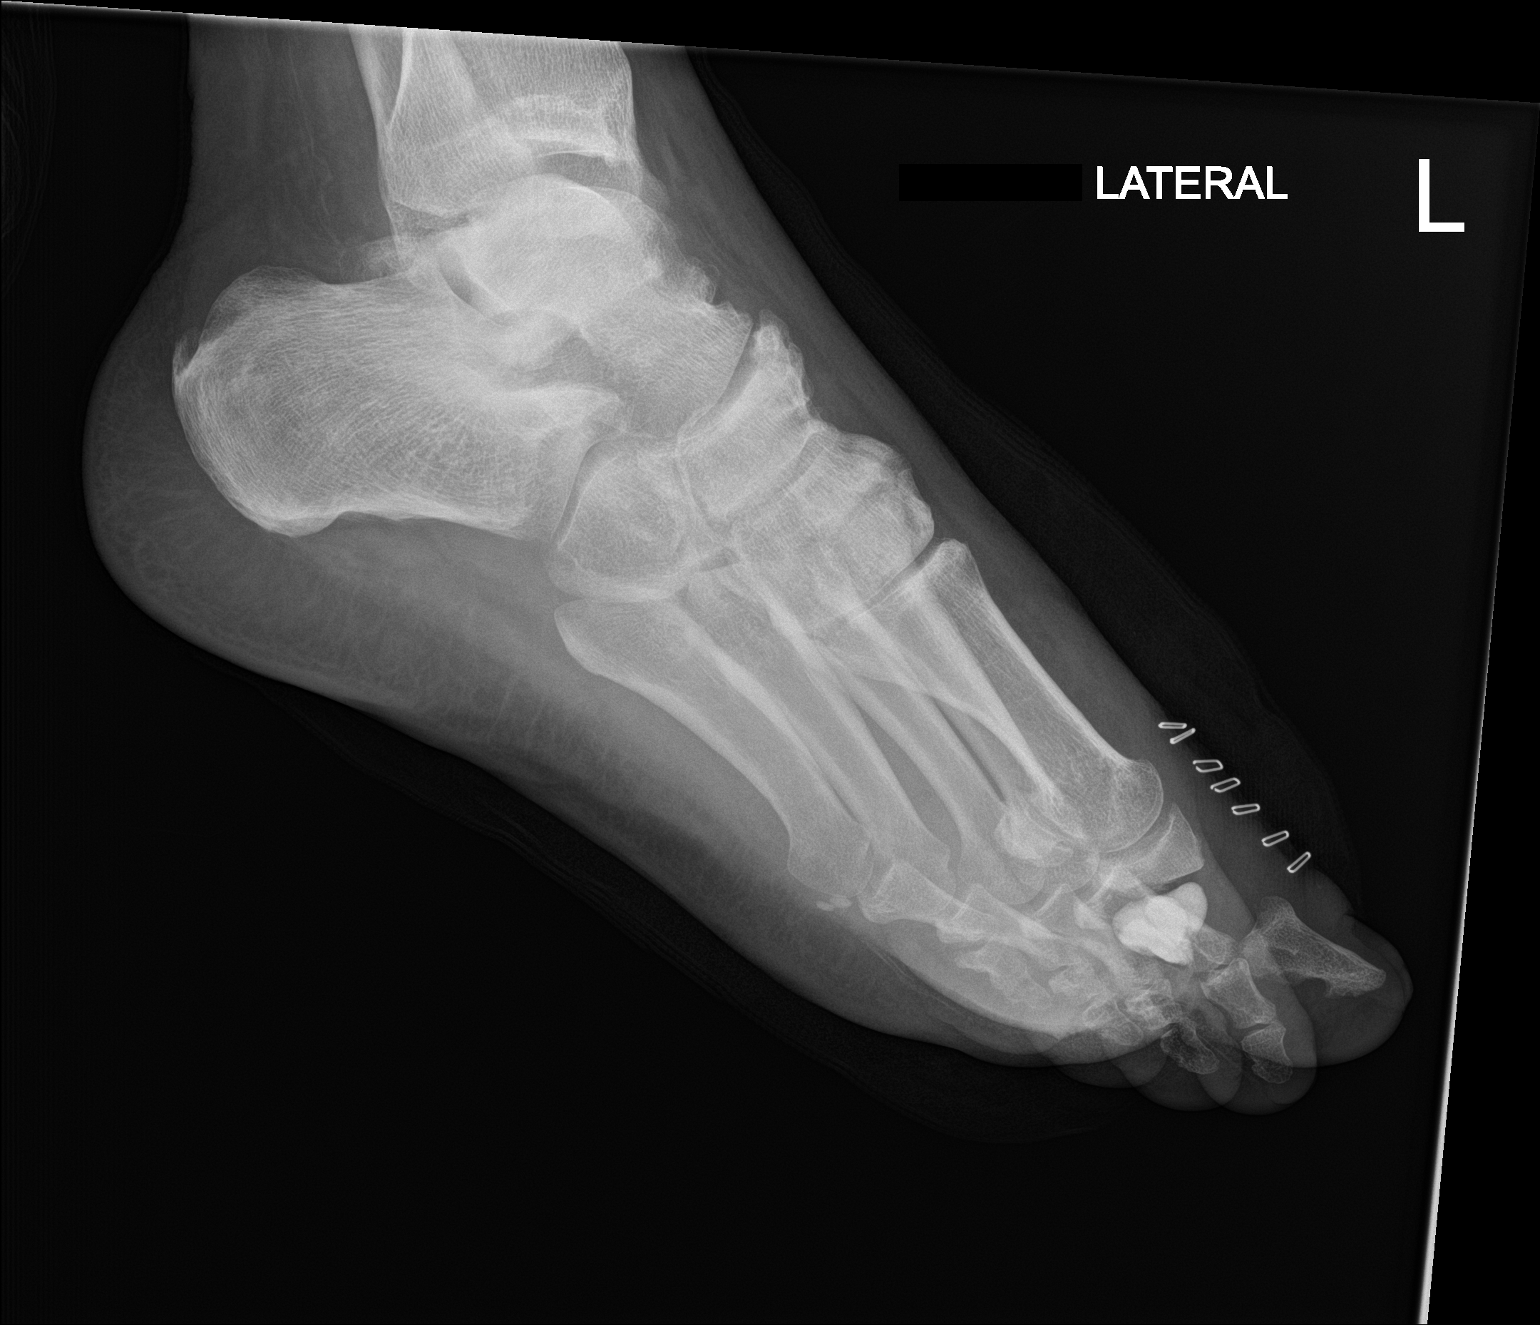

[3 of 3 positions shown; findings below may reference images not displayed]

FINDINGS: The patient is status post interval placement of antibiotic beads at
the amputation site of the first proximal phalanx. Overlying soft
tissue swelling and surgical staples are present. No acute osseous
abnormality.
IMPRESSION: Status post I and D with antibiotic bead placement at the first
proximal phalanx amputation site.

## 2019-11-07 SURGERY — IRRIGATION AND DEBRIDEMENT EXTREMITY
Anesthesia: Monitor Anesthesia Care | Laterality: Left

## 2019-11-07 MED ORDER — FENTANYL CITRATE (PF) 100 MCG/2ML IJ SOLN
INTRAMUSCULAR | Status: AC
  Filled 2019-11-07: qty 2

## 2019-11-07 MED ORDER — SODIUM CHLORIDE 0.9 % IV SOLN
2.0000 g | INTRAVENOUS | Status: DC
  Administered 2019-11-08 – 2019-11-09 (×2): 2 g via INTRAVENOUS
  Filled 2019-11-07: qty 20
  Filled 2019-11-07: qty 2
  Filled 2019-11-07: qty 20

## 2019-11-07 MED ORDER — LIDOCAINE 2% (20 MG/ML) 5 ML SYRINGE
INTRAMUSCULAR | Status: AC
  Filled 2019-11-07: qty 5

## 2019-11-07 MED ORDER — LACTATED RINGERS IV SOLN: INTRAVENOUS | Status: DC

## 2019-11-07 MED ORDER — LIDOCAINE HCL (CARDIAC) PF 100 MG/5ML IV SOSY
PREFILLED_SYRINGE | INTRAVENOUS | Status: DC | PRN
  Administered 2019-11-07: 40 mg via INTRATRACHEAL

## 2019-11-07 MED ORDER — ONDANSETRON HCL 4 MG/2ML IJ SOLN
INTRAMUSCULAR | Status: AC
  Filled 2019-11-07: qty 2

## 2019-11-07 MED ORDER — PROPOFOL 500 MG/50ML IV EMUL
INTRAVENOUS | Status: DC | PRN
  Administered 2019-11-07: 30 mg via INTRAVENOUS

## 2019-11-07 MED ORDER — TOBRAMYCIN SULFATE 1.2 G IJ SOLR
INTRAMUSCULAR | Status: DC | PRN
  Administered 2019-11-07: 2 mg

## 2019-11-07 MED ORDER — PROPOFOL 500 MG/50ML IV EMUL
INTRAVENOUS | Status: DC | PRN
  Administered 2019-11-07: 75 ug/kg/min via INTRAVENOUS

## 2019-11-07 MED ORDER — PROPOFOL 1000 MG/100ML IV EMUL
INTRAVENOUS | Status: AC
  Filled 2019-11-07: qty 100

## 2019-11-07 MED ORDER — PROPOFOL 10 MG/ML IV BOLUS
INTRAVENOUS | Status: AC
  Filled 2019-11-07: qty 20

## 2019-11-07 MED ORDER — DEXAMETHASONE SODIUM PHOSPHATE 10 MG/ML IJ SOLN
INTRAMUSCULAR | Status: AC
  Filled 2019-11-07: qty 1

## 2019-11-07 MED ORDER — 0.9 % SODIUM CHLORIDE (POUR BTL) OPTIME
TOPICAL | Status: DC | PRN
  Administered 2019-11-07: 1000 mL

## 2019-11-07 MED ORDER — MIDAZOLAM HCL 2 MG/2ML IJ SOLN
INTRAMUSCULAR | Status: AC
  Filled 2019-11-07: qty 2

## 2019-11-07 MED ORDER — BUPIVACAINE HCL (PF) 0.5 % IJ SOLN
INTRAMUSCULAR | Status: DC | PRN
  Administered 2019-11-07: 10 mL

## 2019-11-07 MED ORDER — FENTANYL CITRATE (PF) 250 MCG/5ML IJ SOLN
INTRAMUSCULAR | Status: AC
  Filled 2019-11-07: qty 5

## 2019-11-07 MED ORDER — VANCOMYCIN HCL 1250 MG/250ML IV SOLN
1250.0000 mg | Freq: Three times a day (TID) | INTRAVENOUS | Status: DC
  Administered 2019-11-07 – 2019-11-09 (×6): 1250 mg via INTRAVENOUS
  Filled 2019-11-07 (×8): qty 250

## 2019-11-07 MED ORDER — BUPIVACAINE HCL (PF) 0.5 % IJ SOLN
INTRAMUSCULAR | Status: AC
  Filled 2019-11-07: qty 30

## 2019-11-07 MED ORDER — MIDAZOLAM HCL 5 MG/5ML IJ SOLN
INTRAMUSCULAR | Status: DC | PRN
  Administered 2019-11-07: 2 mg via INTRAVENOUS

## 2019-11-07 MED ORDER — SODIUM CHLORIDE 0.9 % IR SOLN
Status: DC | PRN
  Administered 2019-11-07 (×2): 1000 mL

## 2019-11-07 MED ORDER — ONDANSETRON HCL 4 MG/2ML IJ SOLN
INTRAMUSCULAR | Status: DC | PRN
  Administered 2019-11-07: 4 mg via INTRAVENOUS

## 2019-11-07 MED ORDER — FENTANYL CITRATE (PF) 100 MCG/2ML IJ SOLN
INTRAMUSCULAR | Status: DC | PRN
  Administered 2019-11-07: 100 ug via INTRAVENOUS

## 2019-11-07 MED ORDER — VANCOMYCIN HCL 2000 MG/400ML IV SOLN
2000.0000 mg | Freq: Two times a day (BID) | INTRAVENOUS | Status: DC
  Administered 2019-11-07: 2000 mg via INTRAVENOUS
  Filled 2019-11-07: qty 400

## 2019-11-07 MED ORDER — BUPIVACAINE-EPINEPHRINE (PF) 0.5% -1:200000 IJ SOLN
INTRAMUSCULAR | Status: AC
  Filled 2019-11-07: qty 30

## 2019-11-07 SURGICAL SUPPLY — 66 items
APL PRP STRL LF DISP 70% ISPRP (MISCELLANEOUS) ×1
BLADE HEX COATED 2.75 (ELECTRODE) IMPLANT
BLADE OSCILLATING/SAGITTAL (BLADE) ×2
BLADE SURG 15 STRL LF DISP TIS (BLADE) ×1 IMPLANT
BLADE SURG 15 STRL SS (BLADE) ×2
BLADE SW THK.38XMED LNG THN (BLADE) ×1 IMPLANT
BNDG CMPR 9X4 STRL LF SNTH (GAUZE/BANDAGES/DRESSINGS)
BNDG ELASTIC 3X5.8 VLCR STR LF (GAUZE/BANDAGES/DRESSINGS) ×1 IMPLANT
BNDG ELASTIC 4X5.8 VLCR STR LF (GAUZE/BANDAGES/DRESSINGS) ×1 IMPLANT
BNDG ESMARK 4X9 LF (GAUZE/BANDAGES/DRESSINGS) ×1 IMPLANT
BNDG GAUZE ELAST 4 BULKY (GAUZE/BANDAGES/DRESSINGS) ×2 IMPLANT
BONE CEMENT GENTAMICIN (Cement) ×2 IMPLANT
BOWL SMART MIX CTS (DISPOSABLE) ×2 IMPLANT
CEMENT BONE GENTAMICIN 40 (Cement) IMPLANT
CHLORAPREP W/TINT 26 (MISCELLANEOUS) ×2 IMPLANT
COVER BACK TABLE 60X90IN (DRAPES) ×1 IMPLANT
COVER WAND RF STERILE (DRAPES) IMPLANT
CUFF TOURN SGL QUICK 18X4 (TOURNIQUET CUFF) IMPLANT
DRAPE EXTREMITY T 121X128X90 (DISPOSABLE) ×1 IMPLANT
DRAPE IMP U-DRAPE 54X76 (DRAPES) ×1 IMPLANT
DRAPE OEC MINIVIEW 54X84 (DRAPES) ×2 IMPLANT
DRAPE U-SHAPE 47X51 STRL (DRAPES) ×2 IMPLANT
DRSG EMULSION OIL 3X3 NADH (GAUZE/BANDAGES/DRESSINGS) ×1 IMPLANT
DRSG PAD ABDOMINAL 8X10 ST (GAUZE/BANDAGES/DRESSINGS) ×2 IMPLANT
ELECT REM PT RETURN 15FT ADLT (MISCELLANEOUS) ×2 IMPLANT
GAUZE 4X4 16PLY RFD (DISPOSABLE) IMPLANT
GAUZE PACKING IODOFORM 1/4X15 (PACKING) ×1 IMPLANT
GAUZE SPONGE 4X4 12PLY STRL (GAUZE/BANDAGES/DRESSINGS) ×2 IMPLANT
GAUZE XEROFORM 1X8 LF (GAUZE/BANDAGES/DRESSINGS) IMPLANT
GAUZE XEROFORM 5X9 LF (GAUZE/BANDAGES/DRESSINGS) IMPLANT
GLOVE BIO SURGEON STRL SZ7.5 (GLOVE) ×2 IMPLANT
GLOVE BIOGEL PI IND STRL 8 (GLOVE) ×1 IMPLANT
GLOVE BIOGEL PI INDICATOR 8 (GLOVE) ×1
GOWN STRL REUS W/TWL XL LVL3 (GOWN DISPOSABLE) ×4 IMPLANT
HANDPIECE INTERPULSE COAX TIP (DISPOSABLE) ×2
KIT BASIN OR (CUSTOM PROCEDURE TRAY) ×2 IMPLANT
MANIFOLD NEPTUNE II (INSTRUMENTS) ×2 IMPLANT
NDL HYPO 25X1 1.5 SAFETY (NEEDLE) ×1 IMPLANT
NEEDLE HYPO 25X1 1.5 SAFETY (NEEDLE) ×2 IMPLANT
PACK ORTHO EXTREMITY (CUSTOM PROCEDURE TRAY) ×2 IMPLANT
PADDING CAST ABS 4INX4YD NS (CAST SUPPLIES) ×1
PADDING CAST ABS COTTON 4X4 ST (CAST SUPPLIES) ×1 IMPLANT
PENCIL SMOKE EVACUATOR (MISCELLANEOUS) IMPLANT
SET HNDPC FAN SPRY TIP SCT (DISPOSABLE) IMPLANT
SET IRRIG Y TYPE TUR BLADDER L (SET/KITS/TRAYS/PACK) ×2 IMPLANT
SLEEVE SCD COMPRESS KNEE MED (MISCELLANEOUS) ×1 IMPLANT
SPONGE SURGIFOAM ABS GEL 100 (HEMOSTASIS) IMPLANT
STAPLER VISISTAT 35W (STAPLE) ×2 IMPLANT
STOCKINETTE 8 INCH (MISCELLANEOUS) ×2 IMPLANT
SUT ETHILON 2 0 PS N (SUTURE) ×1 IMPLANT
SUT ETHILON 3 0 PS 1 (SUTURE) IMPLANT
SUT ETHILON 4 0 PS 2 18 (SUTURE) ×1 IMPLANT
SUT MNCRL AB 3-0 PS2 18 (SUTURE) IMPLANT
SUT MNCRL AB 4-0 PS2 18 (SUTURE) IMPLANT
SUT MON AB 5-0 PS2 18 (SUTURE) IMPLANT
SUT PROLENE 0 CT 1 30 (SUTURE) ×1 IMPLANT
SUT VIC AB 3-0 FS2 27 (SUTURE) ×2 IMPLANT
SUT VICRYL 4-0 PS2 18IN ABS (SUTURE) ×1 IMPLANT
SWAB COLLECTION DEVICE MRSA (MISCELLANEOUS) ×1 IMPLANT
SWAB CULTURE ESWAB REG 1ML (MISCELLANEOUS) ×1 IMPLANT
SYR BULB EAR ULCER 3OZ GRN STR (SYRINGE) ×2 IMPLANT
SYR CONTROL 10ML LL (SYRINGE) ×2 IMPLANT
TOWEL OR 17X26 10 PK STRL BLUE (TOWEL DISPOSABLE) ×2 IMPLANT
UNDERPAD 30X36 HEAVY ABSORB (UNDERPADS AND DIAPERS) ×2 IMPLANT
YANKAUER SUCT BULB TIP 10FT TU (MISCELLANEOUS) ×1 IMPLANT
YANKAUER SUCT BULB TIP NO VENT (SUCTIONS) ×2 IMPLANT

## 2019-11-07 NOTE — Progress Notes (Addendum)
  Subjective:  Patient ID: Mark Stein, male    DOB: 1985-10-25,  MRN: [id number]  Seen bedside. States the toe is still swollen. No longer having chills. Objective:   Vitals:   11/07/19 0158 11/07/19 0503  BP: (!) 150/81 (!) 141/78  Pulse:  83  Resp:  19  Temp:  98.6 F (37 C)  SpO2:  99%   General AA&O x3. Normal mood and affect.  Vascular Dorsalis pedis and posterior tibial pulses 2/4 bilat. Brisk capillary refill to all digits. Pedal hair present.  Neurologic Epicritic sensation grossly intact.  Dermatologic Left foot ulcer bandaged, some drainage on bandage. Toe still edematous.  Orthopedic: Motor present to the level of the digits.   Assessment & Plan:  Patient was evaluated and treated and all questions answered.  Left great toe osetomyelitis -MRI reviewed with patient. Osteomyelitis, likely acute. -Continue empiric abx -WBAT in surgical shoe -PT/OT after surgery -Discussed amputation vs salvage of the toe. Patient prefers salvage. Given age and lack of medical comorbidities I think this is reasonable. Discussed he will likely need 6 weeks of antibiotics. Patient understands. -Surgery today for incision and drainage, possible excision of ulcer, bone biopsy. Orders placed. -Will continue to follow.  Park Liter, DPM  Accessible via secure chat for questions or concerns.

## 2019-11-07 NOTE — Progress Notes (Signed)
Pharmacy Antibiotic Note  Sacha Radloff is a 34 y.o. male with left foot ulcer who was seen by his podiatrist Dr. Samuella Cota on 7/15 for wound check and had c/o of foot pain and fever. He was sent to the ED on 11/06/2019 for evaluation and management of infected foot.  MRI of foot showed osteomyelitis and "marked" cellulitis. He's currently on vancomycin and ceftriaxone for infection.  Today, 11/07/2019: - Day #2 abx - Tmax 101.1, wbc down 19.3 - scr 0.79 (crcl~100) - plan at this time is for medical management with abx vs amputation  Plan: - continue ceftriaxone 2gm IV q24h - adjust vancomycin to 1250 mg IV q8h for goal trough 15-20 - f/u cultures and clinical status ________________________________________  Height: 6' (182.9 cm) Weight: 129 kg (284 lb 6.3 oz) IBW/kg (Calculated) : 77.6  Temp (24hrs), Avg:99.7 F (37.6 C), Min:98.4 F (36.9 C), Max:101.1 F (38.4 C)  Recent Labs  Lab 11/06/19 1410 11/06/19 1447 11/07/19 0415  WBC  --  25.4* 19.3*  CREATININE  --  0.74 0.79  LATICACIDVEN 0.8 1.3  --     Estimated Creatinine Clearance: 184.1 mL/min (by C-G formula based on SCr of 0.79 mg/dL).    No Known Allergies  Antimicrobials this admission:  7/15 Vancomycin >> 7/15 Ceftriaxone  >>   Microbiology results:  7/15 BCx x2:   Thank you for allowing pharmacy to be a part of this patients care.  Lucia Gaskins 11/07/2019 8:17 AM

## 2019-11-07 NOTE — Transfer of Care (Signed)
Immediate Anesthesia Transfer of Care Note  Patient: Mark Stein  Procedure(s) Performed: IRRIGATION AND DEBRIDEMENT LEFT GREAT TOE WITH BONE BIOPSY, INSERTION OF ANTIBIOTIC BEADS (Left )  Patient Location: PACU  Anesthesia Type:MAC  Level of Consciousness: drowsy, patient cooperative and responds to stimulation  Airway & Oxygen Therapy: Patient Spontanous Breathing and Patient connected to face mask oxygen  Post-op Assessment: Report given to RN and Post -op Vital signs reviewed and stable  Post vital signs: Reviewed and stable  Last Vitals:  Vitals Value Taken Time  BP 128/79 11/07/19 1508  Temp    Pulse 93 11/07/19 1510  Resp 27 11/07/19 1510  SpO2 94 % 11/07/19 1510  Vitals shown include unvalidated device data.  Last Pain:  Vitals:   11/07/19 1139  TempSrc:   PainSc: 0-No pain      Patients Stated Pain Goal: 4 (90/93/11 2162)  Complications: No complications documented.

## 2019-11-07 NOTE — Anesthesia Postprocedure Evaluation (Signed)
Anesthesia Post Note  Patient: Mark Stein  Procedure(s) Performed: IRRIGATION AND DEBRIDEMENT LEFT GREAT TOE WITH BONE BIOPSY, INSERTION OF ANTIBIOTIC BEADS (Left )     Patient location during evaluation: PACU Anesthesia Type: MAC Level of consciousness: awake and alert Pain management: pain level controlled Vital Signs Assessment: post-procedure vital signs reviewed and stable Respiratory status: spontaneous breathing, nonlabored ventilation, respiratory function stable and patient connected to nasal cannula oxygen Cardiovascular status: stable and blood pressure returned to baseline Postop Assessment: no apparent nausea or vomiting Anesthetic complications: no   No complications documented.  Last Vitals:  Vitals:   11/07/19 1630 11/07/19 1636  BP: (!) 143/93 (!) 143/93  Pulse: 95 97  Resp: (!) 22 17  Temp:  36.9 C  SpO2: 96% 98%    Last Pain:  Vitals:   11/07/19 1647  TempSrc:   PainSc: 0-No pain                 Barnet Glasgow

## 2019-11-07 NOTE — Anesthesia Preprocedure Evaluation (Addendum)
Anesthesia Evaluation  Patient identified by MRN, date of birth, ID band Patient awake    Reviewed: Allergy & Precautions, NPO status , Patient's Chart, lab work & pertinent test results  Airway Mallampati: I       Dental no notable dental hx. (+) Teeth Intact   Pulmonary Current Smoker and Patient abstained from smoking., former smoker,    Pulmonary exam normal breath sounds clear to auscultation       Cardiovascular negative cardio ROS Normal cardiovascular exam Rhythm:Regular Rate:Normal     Neuro/Psych negative psych ROS   GI/Hepatic negative GI ROS, Neg liver ROS,   Endo/Other  negative endocrine ROS  Renal/GU negative Renal ROS  negative genitourinary   Musculoskeletal   Abdominal (+) + obese,   Peds  Hematology negative hematology ROS (+)   Anesthesia Other Findings   Reproductive/Obstetrics                             Anesthesia Physical  Anesthesia Plan  ASA: II  Anesthesia Plan: MAC   Post-op Pain Management:    Induction: Intravenous  PONV Risk Score and Plan: 3 and Ondansetron  Airway Management Planned: Natural Airway and Simple Face Mask  Additional Equipment: None  Intra-op Plan:   Post-operative Plan:   Informed Consent: I have reviewed the patients History and Physical, chart, labs and discussed the procedure including the risks, benefits and alternatives for the proposed anesthesia with the patient or authorized representative who has indicated his/her understanding and acceptance.       Plan Discussed with: CRNA  Anesthesia Plan Comments:         Anesthesia Quick Evaluation

## 2019-11-07 NOTE — Progress Notes (Signed)
PROGRESS NOTE    Mark Stein  YQM:[id number] DOB: 03/31/1986 DOA: 11/06/2019 PCP: Patient, No Pcp Per  Brief Narrative:  34 year old home dwelling African-American male Known history chronic hammering of toes status post right great to debridement closure with arthroplasty and antibiotic beads 10/16/2018 Tissue transfer on left side 02/05/2019 by Dr. Samuella Cota podiatry Left foot drop that may have started 2018 after fall tripping his and hurting his knee in 2018 Follows with Dr. Cherrie Distance of neurology (because of foot drop-patient was seen earlier this month and tells me that thought processes that patient may have had a lumbar sprain or strain causing foot drop bilaterally with an EMG and an MRI of the lower back being planned for later this month)  Former smoker current drinker Presented to podiatry office febrile (101.1) tachycardic (111) and was sent to ED by Dr. Samuella Cota  He is currently afebrile and in no distress and has somewhat insensate feet   Assessment & Plan:   Active Problems:   Osteomyelitis (HCC)   Cellulitis   Ulcer of great toe, left, with necrosis of bone (HCC)   1. Osteomyelitis and sepsis lower extremity a. Status post IND + biopsy + antibiotic beads left side Dr. Samuella Cota b. Continuing empiric ceftriaxone vancomycin c. Will need PICC line placement which we will order for today d. Follow deep wound cultures-growing gram-positive cocci and gram-negative rod without speciation at this time 2. Probable disc disease with foot drop bilaterally a. Needs outpatient follow-up with Dr. Cherrie Distance of neurology who has seen him in the past b. He was able to ambulate in the room with a walker nonweightbearing on that foot so I think he can probably go home 3. Former smoker   DVT prophylaxis: Lovenox  or other as per podiatry when to resume Code Status: Full Family Communication: None inpatient pending resolution Disposition:   Status is: Inpatient  Remains inpatient appropriate  because:Ongoing active pain requiring inpatient pain management, Unsafe d/c plan and IV treatments appropriate due to intensity of illness or inability to take PO   Dispo: The patient is from: Home              Anticipated d/c is to: Home              Anticipated d/c date is: 3 days              Patient currently is not medically stable to d/c.   Consultants:   Dr. Samuella Cota of podiatry  Procedures: I&D and foot salvaging surgery 11/07/2019  Antimicrobials: Ceftriaxone vancomycin   Subjective: Doing fair no issues Pain is moderately controlled Ambulatory as above No chest pain Eating and drinking  Objective: Vitals:   11/07/19 1545 11/07/19 1600 11/07/19 1615 11/07/19 1630  BP: (!) 141/93 (!) 146/91 (!) 148/94 (!) 143/93  Pulse: 81 85 94 95  Resp: 20 14 15  (!) 22  Temp:  (!) 97.4 F (36.3 C)    TempSrc:      SpO2: 96% 100% 96% 96%  Weight:      Height:        Intake/Output Summary (Last 24 hours) at 11/07/2019 1636 Last data filed at 11/07/2019 1600 Gross per 24 hour  Intake 1040.89 ml  Output 1610 ml  Net -569.11 ml   Filed Weights   11/06/19 1208 11/07/19 0500 11/07/19 1139  Weight: 130.2 kg 129 kg 130.2 kg    Examination:  General exam: Awake coherent no distress Respiratory system: Clear no added sound no rales no  rhonchi Cardiovascular system: S1-S2 no murmur Gastrointestinal system: Soft nontender. Central nervous system: Neurologically intact however does have bilateral foot drop Extremities: Soft Skin: I did not examine wound as Dr. Samuella Cota will be examining it today Psychiatry: Euthymic congruent  Data Reviewed: I have personally reviewed following labs and imaging studies Potassium 3.4-->3.8 BUN/creatinine 10/0.7-->9/0.7  WBC 19.3-->13.7 Hemoglobin 11.2 Platelet 348  Radiology Studies: MR FOOT LEFT W WO CONTRAST  Result Date: 11/06/2019 CLINICAL DATA:  History of prior left foot surgery 10/16/2018, fever, tachycardia, left foot swelling EXAM:  MRI OF THE LEFT FOREFOOT WITHOUT AND WITH CONTRAST TECHNIQUE: Multiplanar, multisequence MR imaging of the left foot was performed both before and after administration of intravenous contrast. CONTRAST:  11mL GADAVIST GADOBUTROL 1 MMOL/ML IV SOLN COMPARISON:  11/06/2019 FINDINGS: Bones/Joint/Cartilage Postsurgical changes are seen from prior resection of the distal margin of the first proximal phalanx. The remaining first proximal phalanx demonstrates abnormal marrow edema consistent with osteomyelitis. The remaining visualized bony structures demonstrate normal signal characteristics. There is prominent osteoarthritis of the first metatarsophalangeal joint. Ligaments No acute abnormalities. Muscles and Tendons There is fluid within the tendon sheath of the flexor hallucis longus, consistent with tenosynovitis. Soft tissues There is diffuse edema within the first digit, with evidence of focal ulceration plantar aspect at the level of the metatarsophalangeal joint. There is diffuse abnormal enhancement throughout the first digit plantar aspect. No fluid collection or abscess. IMPRESSION: 1. Abnormal marrow edema and enhancement within the residual portions of the first proximal phalanx, consistent with osteomyelitis given clinical presentation. 2. Ulceration, edema, and enhancement of the soft tissues plantar aspect first digit, consistent with marked cellulitis. No fluid collection or abscess. 3. Tenosynovitis of the flexor hallucis longus tendon. Electronically Signed   By: Sharlet Salina M.D.   On: 11/06/2019 18:54   DG Chest Portable 1 View  Result Date: 11/06/2019 CLINICAL DATA:  Fever EXAM: PORTABLE CHEST 1 VIEW COMPARISON:  02/11/2018 FINDINGS: The heart size and mediastinal contours are within normal limits. Both lungs are clear. The visualized skeletal structures are unremarkable. IMPRESSION: No active disease. Electronically Signed   By: Helyn Numbers MD   On: 11/06/2019 15:53   DG Foot Complete  Left  Result Date: 11/06/2019 Please see detailed radiograph report in office note.    Scheduled Meds: . enoxaparin (LOVENOX) injection  60 mg Subcutaneous Q24H  . nicotine  7 mg Transdermal Daily   Continuous Infusions: . sodium chloride 100 mL/hr at 11/06/19 2143  . cefTRIAXone (ROCEPHIN)  IV    . vancomycin       LOS: 1 day    Time spent: 15  Rhetta Mura, MD [facility] To contact the attending provider between 7A-7P or the covering provider during after hours 7P-7A, please log into the web site [email or web address] and access using universal [place] password for that web site. If you do not have the password, please call the hospital operator.  11/07/2019, 4:36 PM

## 2019-11-07 NOTE — Progress Notes (Signed)
Pharmacy Antibiotic Note  Mark Stein is a 34 y.o. male admitted on 11/06/2019 with Osteomyelitis.  Pharmacy has been consulted for Vancomycin, ceftriaxone dosing.  Plan: Ceftriaxone 2gm iv q24hr Vancomycin 2500mg  iv x1, then Vancomycin 2000 mg IV Q 12 hrs. Goal AUC 400-550. Expected AUC: 495 SCr used: 0.8 (adjusted)  Height: 6' (182.9 cm) Weight: 129 kg (284 lb 6.3 oz) IBW/kg (Calculated) : 77.6  Temp (24hrs), Avg:99.7 F (37.6 C), Min:98.4 F (36.9 C), Max:101.1 F (38.4 C)  Recent Labs  Lab 11/06/19 1410 11/06/19 1447 11/07/19 0415  WBC  --  25.4* 19.3*  CREATININE  --  0.74 0.79  LATICACIDVEN 0.8 1.3  --     Estimated Creatinine Clearance: 184.1 mL/min (by C-G formula based on SCr of 0.79 mg/dL).    No Known Allergies  Antimicrobials this admission: Vancomycin 11/06/2019 >> Ceftriaxone 11/06/2019 >>   Dose adjustments this admission:   Microbiology results:   Thank you for allowing pharmacy to be a part of this patient's care.  11/08/2019 [place] 11/07/2019 6:16 AM

## 2019-11-07 NOTE — Op Note (Signed)
Patient Name: Mark Stein DOB: [birth year]  MRN: [id number]   Date of Service: 11/07/19  Surgeon: Dr. Hardie Pulley, DPM Assistants: None Pre-operative Diagnosis:  Osteomyelitis, left foot Post-operative Diagnosis:  Same Procedures:  1) Incision and drainage, left foot  2) Bone Biopsy left proximal phalanx  3) Insertion of antibiotic spacer Pathology/Specimens: ID Type Source Tests Collected by Time Destination  1 : phalenx bone FOR MICRO TEST Tissue PATH Other SURGICAL PATHOLOGY Evelina Bucy, DPM 11/07/2019 1348   2 : PHALENX bone FOR PATHOLOGY Tissue PATH Other SURGICAL PATHOLOGY Evelina Bucy, DPM 11/07/2019 1419   A : plantar foot Tissue PATH Other ANAEROBIC CULTURE, AEROBIC/ANAEROBIC CULTURE (SURGICAL/DEEP WOUND) Evelina Bucy, DPM 11/07/2019 1401    Anesthesia: MAC/local Hemostasis:  Total Tourniquet Time Documented: Leg (Left) - 67 minutes Total: Leg (Left) - 67 minutes  Estimated Blood Loss: none Materials:  Implant Name Type Inv. Item Serial No. Manufacturer Lot No. LRB No. Used Action  BONE CEMENT GENTAMICIN - [id number] Cement BONE CEMENT GENTAMICIN  DEPUY SYNTHES [id number] Left 1 Implanted   Medications: Gentamicin/Tobramycin Complications: none  Indications for Procedure:  This is a 34 y.o. male with a chronic ulcer to the left foot that became infected over the last week. He was admitted with signs of sepsis. MRI showed osteomyelitis. It was discussed usual treatment would be amputation. He elects for salvage. It was discussed he would benefit from incision and drainage and bone biopsy. All risks, benefits, and alternatives were discussed with the patient. No guarantees were given.   Procedure in Detail: Patient was identified in pre-operative holding area. Formal consent was signed and the left lower extremity was marked. Patient was brought back to the operating room. Anesthesia was induced. The extremity was prepped and draped in the usual sterile fashion.  Timeout was taken to confirm patient name, laterality, and procedure prior to incision.   Attention was then directed to the left plantar foot. There was a wound measuring 2x2. The central area of the wound was necrotic appearing. There was purulence in the wound. The wound was cultured with a swab. The wound was debrided with a 15 blade and rongeur. The flexor tendon was partially necrotic and had to be debrided. The bone was debrided with a rongeur and sent for micro. The wound was irrigated with 1L of normal saline via pulse lavage.   An incision was then made dorsally to examine the bone and to assist in resection. The incision was continued to the bone. The wound was irrigated with 1L of normal saline via pulse lavage. The phalanx dorsally appeared viable. The bone was resected from dorsal distal to plantar proximal. The bone was sent for pathology. The wound was then rasped smooth from the plantar portion to ensure no prominent bone. Both wounds were irrigated with pulse lavage. The wound was closed in layers with 3-0 vicryl, 4-0 nylon, and skin staples.  3x Gentamicin-Tobramycin beads on a 0-prolene string were packed into the plantar wound. The plantar wound was reduced with 2-0 nylon retention sutures. The wound was packed with iodoform packing.  The foot was then dressed with 4x4, kerlix, ACE bandage. Patient tolerated the procedure well.   Disposition: Following a period of post-operative monitoring, patient will be transferred to the floor.

## 2019-11-07 NOTE — Brief Op Note (Signed)
11/07/2019  3:01 PM  PATIENT:  Mark Stein  34 y.o. male  PRE-OPERATIVE DIAGNOSIS:  infected left great toe  POST-OPERATIVE DIAGNOSIS:  infected left great toe  PROCEDURE:  Procedure(s): IRRIGATION AND DEBRIDEMENT LEFT GREAT TOE WITH BONE BIOPSY, INSERTION OF ANTIBIOTIC BEADS (Left)  SURGEON:  Surgeon(s) and Role:    * Evelina Bucy, DPM - Primary  PHYSICIAN ASSISTANT:   ASSISTANTS: none   ANESTHESIA:   local and MAC  EBL:  69m    BLOOD ADMINISTERED:none  DRAINS: none   LOCAL MEDICATIONS USED:  MARCAINE    and Amount: 10 ml  SPECIMEN:   ID Type Source Tests Collected by Time Destination  1 : phalenx bone FOR MICRO TEST Tissue PATH Other SURGICAL PATHOLOGY PEvelina Bucy DPM 11/07/2019 1348   2 : PHALENX bone FOR PATHOLOGY Tissue PATH Other SURGICAL PATHOLOGY PEvelina Bucy DPM 11/07/2019 1419   A : plantar foot Tissue PATH Other ANAEROBIC CULTURE, AEROBIC/ANAEROBIC CULTURE (SURGICAL/DEEP WOUND) PEvelina Bucy DPM 11/07/2019 1401       DISPOSITION OF SPECIMEN:  N/A  COUNTS:  YES  TOURNIQUET:   Total Tourniquet Time Documented: Leg (Left) - 67 minutes Total: Leg (Left) - 67 minutes   DICTATION: .DViviann SpareDictation  PLAN OF CARE: transfer to floor  PATIENT DISPOSITION:  PACU - hemodynamically stable.   Delay start of Pharmacological VTE agent (>24hrs) due to surgical blood loss or risk of bleeding: not applicable

## 2019-11-08 ENCOUNTER — Inpatient Hospital Stay: Payer: Self-pay

## 2019-11-08 LAB — COMPREHENSIVE METABOLIC PANEL
ALT: 22 U/L (ref 0–44)
AST: 18 U/L (ref 15–41)
Albumin: 3 g/dL — ABNORMAL LOW (ref 3.5–5.0)
Alkaline Phosphatase: 54 U/L (ref 38–126)
Anion gap: 10 (ref 5–15)
BUN: 9 mg/dL (ref 6–20)
CO2: 24 mmol/L (ref 22–32)
Calcium: 8.4 mg/dL — ABNORMAL LOW (ref 8.9–10.3)
Chloride: 103 mmol/L (ref 98–111)
Creatinine, Ser: 0.72 mg/dL (ref 0.61–1.24)
GFR calc Af Amer: >60 mL/min (ref >60)
GFR calc non Af Amer: >60 mL/min (ref >60)
Glucose, Bld: 97 mg/dL (ref 70–99)
Potassium: 3.8 mmol/L (ref 3.5–5.1)
Sodium: 137 mmol/L (ref 135–145)
Total Bilirubin: 0.9 mg/dL (ref 0.3–1.2)
Total Protein: 7.2 g/dL (ref 6.5–8.1)

## 2019-11-08 LAB — CBC WITH DIFFERENTIAL/PLATELET
Abs Immature Granulocytes: 0.1 10*3/uL — ABNORMAL HIGH (ref 0.00–0.07)
Basophils Absolute: 0.1 10*3/uL (ref 0.0–0.1)
Basophils Relative: 1 %
Eosinophils Absolute: 0.5 10*3/uL (ref 0.0–0.5)
Eosinophils Relative: 3 %
HCT: 34.4 % — ABNORMAL LOW (ref 39.0–52.0)
Hemoglobin: 11.2 g/dL — ABNORMAL LOW (ref 13.0–17.0)
Immature Granulocytes: 1 %
Lymphocytes Relative: 10 %
Lymphs Abs: 1.4 10*3/uL (ref 0.7–4.0)
MCH: 29.4 pg (ref 26.0–34.0)
MCHC: 32.6 g/dL (ref 30.0–36.0)
MCV: 90.3 fL (ref 80.0–100.0)
Monocytes Absolute: 0.8 10*3/uL (ref 0.1–1.0)
Monocytes Relative: 6 %
Neutro Abs: 10.9 10*3/uL — ABNORMAL HIGH (ref 1.7–7.7)
Neutrophils Relative %: 79 %
Platelets: 348 10*3/uL (ref 150–400)
RBC: 3.81 MIL/uL — ABNORMAL LOW (ref 4.22–5.81)
RDW: 12.2 % (ref 11.5–15.5)
WBC: 13.7 10*3/uL — ABNORMAL HIGH (ref 4.0–10.5)
nRBC: 0 % (ref 0.0–0.2)

## 2019-11-08 LAB — CREATININE, SERUM
Creatinine, Ser: 0.8 mg/dL (ref 0.61–1.24)
GFR calc Af Amer: >60 mL/min (ref >60)
GFR calc non Af Amer: >60 mL/min (ref >60)

## 2019-11-08 MED ORDER — SODIUM CHLORIDE 0.9% FLUSH
10.0000 mL | Freq: Two times a day (BID) | INTRAVENOUS | Status: DC
  Administered 2019-11-09 (×2): 10 mL

## 2019-11-08 MED ORDER — CHLORHEXIDINE GLUCONATE CLOTH 2 % EX PADS
6.0000 | MEDICATED_PAD | Freq: Every day | CUTANEOUS | Status: DC
  Administered 2019-11-08 – 2019-11-10 (×3): 6 via TOPICAL

## 2019-11-08 MED ORDER — SODIUM CHLORIDE 0.9% FLUSH: 10.0000 mL | INTRAVENOUS | Status: DC | PRN

## 2019-11-08 MED ORDER — MORPHINE SULFATE (PF) 2 MG/ML IV SOLN
1.0000 mg | INTRAVENOUS | Status: AC
  Administered 2019-11-08: 1 mg via INTRAVENOUS
  Filled 2019-11-08: qty 1

## 2019-11-08 NOTE — Progress Notes (Signed)
Spoke with RN .  Notified will be there to place PICC around 1730.

## 2019-11-08 NOTE — Progress Notes (Signed)
Peripherally Inserted Central Catheter Placement  The IV Nurse has discussed with the patient and/or persons authorized to consent for the patient, the purpose of this procedure and the potential benefits and risks involved with this procedure.  The benefits include less needle sticks, lab draws from the catheter, and the patient may be discharged home with the catheter. Risks include, but not limited to, infection, bleeding, blood clot (thrombus formation), and puncture of an artery; nerve damage and irregular heartbeat and possibility to perform a PICC exchange if needed/ordered by physician.  Alternatives to this procedure were also discussed.  Bard Power PICC patient education guide, fact sheet on infection prevention and patient information card has been provided to patient /or left at bedside.    PICC Placement Documentation  PICC Single Lumen 11/08/19 PICC Right Brachial 43 cm 0 cm (Active)  Indication for Insertion or Continuance of Line Home intravenous therapies (PICC only) 11/08/19 1816  Exposed Catheter (cm) 0 cm 11/08/19 1816  Site Assessment Clean;Dry;Intact 11/08/19 1816  Line Status Flushed;Saline locked;Blood return noted 11/08/19 1816  Dressing Type Transparent 11/08/19 1816  Dressing Status Clean;Dry;Intact;Antimicrobial disc in place 11/08/19 1816  Safety Lock Not Applicable 11/08/19 1816  Dressing Intervention New dressing 11/08/19 1816  Dressing Change Due 11/15/19 11/08/19 1816       Mark Stein 11/08/2019, 6:17 PM

## 2019-11-08 NOTE — Progress Notes (Signed)
Subjective:  Patient ID: Mark Stein, male    DOB: 31-Oct-1985,  MRN: [id number]  Seen bedside. Denies pain at present. No overnight issues. Objective:   Vitals:   11/08/19 0608 11/08/19 1305  BP: 137/77 116/80  Pulse: 79 89  Resp: 20   Temp: 98.4 F (36.9 C) 98 F (36.7 C)  SpO2: 98% 95%   General AA&O x3. Normal mood and affect.  Vascular Dorsalis pedis and posterior tibial pulses 2/4 bilat. Brisk capillary refill to all digits. Pedal hair present.  Neurologic Epicritic sensation grossly intact.  Dermatologic Left foot wound healing well, still with edema and warmth of the foot. No purulence. Resolving erythema.  Orthopedic: Motor present to the level of the digits.   Results for orders placed or performed during the hospital encounter of 11/06/19  Culture, blood (Routine x 2)     Status: None (Preliminary result)   Collection Time: 11/06/19  2:48 PM   Specimen: BLOOD  Result Value Ref Range Status   Specimen Description   Final    BLOOD RIGHT ANTECUBITAL Performed at [facility], [address], [place], [place] [address]    Special Requests   Final    BOTTLES DRAWN AEROBIC AND ANAEROBIC Blood Culture adequate volume Performed at [facility], [address], [place], [place] [address]    Culture   Final    NO GROWTH 2 DAYS Performed at [facility], [address], [place], [place] [address]    Report Status PENDING  Incomplete  Culture, blood (Routine x 2)     Status: None (Preliminary result)   Collection Time: 11/06/19  3:25 PM   Specimen: BLOOD  Result Value Ref Range Status   Specimen Description   Final    BLOOD LEFT ANTECUBITAL Performed at [facility], [address], [place], [place] [address]    Special Requests   Final    BOTTLES DRAWN AEROBIC AND ANAEROBIC Blood Culture adequate volume Performed at [facility], [address] [place], [place] [address]     Culture   Final    NO GROWTH 2 DAYS Performed at [facility], [address] [place], [place] [address]    Report Status PENDING  Incomplete  SARS Coronavirus 2 by RT PCR (hospital order, performed in [facility] hospital lab) Nasopharyngeal Nasopharyngeal Swab     Status: None   Collection Time: 11/06/19  6:52 PM   Specimen: Nasopharyngeal Swab  Result Value Ref Range Status   SARS Coronavirus 2 NEGATIVE NEGATIVE Final    Comment: (NOTE) SARS-CoV-2 target nucleic acids are NOT DETECTED.  The SARS-CoV-2 RNA is generally detectable in upper and lower respiratory specimens during the acute phase of infection. The lowest concentration of SARS-CoV-2 viral copies this assay can detect is 250 copies / mL. A negative result does not preclude SARS-CoV-2 infection and should not be used as the sole basis for treatment or other patient management decisions.  A negative result may occur with improper specimen collection / handling, submission of specimen other than nasopharyngeal swab, presence of viral mutation(s) within the areas targeted by this assay, and inadequate number of viral copies (<250 copies / mL). A negative result must be combined with clinical observations, patient history, and epidemiological information.  Fact Sheet for Patients:   BoilerBrush.com.cy  Fact Sheet for Healthcare Providers: https://pope.com/  This test is not yet approved or  cleared by the [place] FDA and has been  authorized for detection and/or diagnosis of SARS-CoV-2 by FDA under an Emergency Use Authorization (EUA).  This EUA will remain in effect (meaning this test can be used) for the duration of the COVID-19 declaration under Section 564(b)(1) of the Act, 21 U.S.C. section 360bbb-3(b)(1), unless the authorization is terminated or revoked sooner.  Performed at [facility], [address] [place], [place] [address]    Surgical pcr screen     Status: None   Collection Time: 11/07/19  7:48 AM   Specimen: Nasal Mucosa; Nasal Swab  Result Value Ref Range Status   MRSA, PCR NEGATIVE NEGATIVE Final   Staphylococcus aureus NEGATIVE NEGATIVE Final    Comment: (NOTE) The Xpert SA Assay (FDA approved for NASAL specimens in patients 73 years of age and older), is one component of a comprehensive surveillance program. It is not intended to diagnose infection nor to guide or monitor treatment. Performed at [facility], [address], [place], [place] [address]   Aerobic/Anaerobic Culture (surgical/deep wound)     Status: None (Preliminary result)   Collection Time: 11/07/19  2:01 PM   Specimen: PATH Other; Tissue  Result Value Ref Range Status   Specimen Description   Final    FOOT LEFT PLANTAR Performed at [facility], [address], [place], [place] [address]    Special Requests   Final    NONE Performed at [facility], [address], [place], [place] [address]    Gram Stain   Final    FEW WBC PRESENT, PREDOMINANTLY PMN FEW GRAM NEGATIVE RODS FEW GRAM POSITIVE COCCI IN CLUSTERS RARE GRAM POSITIVE RODS    Culture   Final    TOO YOUNG TO READ Performed at [facility], [address], [place], [place] [address]    Report Status PENDING  Incomplete  Aerobic/Anaerobic Culture (surgical/deep wound)     Status: None (Preliminary result)   Collection Time: 11/07/19  2:01 PM   Specimen: Bone  Result Value Ref Range Status   Specimen Description BONE  Final   Special Requests PHALENX  Final   Gram Stain   Final    RARE WBC PRESENT, PREDOMINANTLY PMN FEW GRAM POSITIVE COCCI IN PAIRS IN CLUSTERS RARE GRAM POSITIVE RODS Performed at [facility], [address] [place], [place] [address]    Culture PENDING  Incomplete   Report Status PENDING  Incomplete    Assessment & Plan:  Patient was evaluated and treated and all  questions answered.  Left great toe osetomyelitis s/p incision and drainage bone biopsy insertion of abx beads -Wound flushed, iodoform packing replaced. Patient medicated for pain. Tolerated well. Dressing changes to only be done by [facility] for now. -WBC downtrending. -Received PICC today. Plan will be for six weeks of IV abx given residual infected but viable bone. -Culture pending. Polymicrobial with GPC, GPR, GNRs on gram stain -Left foot Heel touch down WB in boot with walker assist -Will continue to follow.  Park Liter, DPM  Accessible via secure chat for questions or concerns.

## 2019-11-09 LAB — CBC WITH DIFFERENTIAL/PLATELET
Abs Immature Granulocytes: 0.05 10*3/uL (ref 0.00–0.07)
Basophils Absolute: 0.1 10*3/uL (ref 0.0–0.1)
Basophils Relative: 1 %
Eosinophils Absolute: 0.6 10*3/uL — ABNORMAL HIGH (ref 0.0–0.5)
Eosinophils Relative: 6 %
HCT: 33.4 % — ABNORMAL LOW (ref 39.0–52.0)
Hemoglobin: 10.9 g/dL — ABNORMAL LOW (ref 13.0–17.0)
Immature Granulocytes: 1 %
Lymphocytes Relative: 18 %
Lymphs Abs: 1.6 10*3/uL (ref 0.7–4.0)
MCH: 29.5 pg (ref 26.0–34.0)
MCHC: 32.6 g/dL (ref 30.0–36.0)
MCV: 90.3 fL (ref 80.0–100.0)
Monocytes Absolute: 0.7 10*3/uL (ref 0.1–1.0)
Monocytes Relative: 8 %
Neutro Abs: 5.9 10*3/uL (ref 1.7–7.7)
Neutrophils Relative %: 66 %
Platelets: 373 10*3/uL (ref 150–400)
RBC: 3.7 MIL/uL — ABNORMAL LOW (ref 4.22–5.81)
RDW: 12 % (ref 11.5–15.5)
WBC: 8.8 10*3/uL (ref 4.0–10.5)
nRBC: 0 % (ref 0.0–0.2)

## 2019-11-09 LAB — COMPREHENSIVE METABOLIC PANEL
ALT: 25 U/L (ref 0–44)
AST: 17 U/L (ref 15–41)
Albumin: 2.9 g/dL — ABNORMAL LOW (ref 3.5–5.0)
Alkaline Phosphatase: 51 U/L (ref 38–126)
Anion gap: 9 (ref 5–15)
BUN: 8 mg/dL (ref 6–20)
CO2: 25 mmol/L (ref 22–32)
Calcium: 8.3 mg/dL — ABNORMAL LOW (ref 8.9–10.3)
Chloride: 103 mmol/L (ref 98–111)
Creatinine, Ser: 0.64 mg/dL (ref 0.61–1.24)
GFR calc Af Amer: >60 mL/min (ref >60)
GFR calc non Af Amer: >60 mL/min (ref >60)
Glucose, Bld: 93 mg/dL (ref 70–99)
Potassium: 3.6 mmol/L (ref 3.5–5.1)
Sodium: 137 mmol/L (ref 135–145)
Total Bilirubin: 0.7 mg/dL (ref 0.3–1.2)
Total Protein: 7.2 g/dL (ref 6.5–8.1)

## 2019-11-09 LAB — AEROBIC/ANAEROBIC CULTURE W GRAM STAIN (SURGICAL/DEEP WOUND)

## 2019-11-09 LAB — VANCOMYCIN, TROUGH: Vancomycin Tr: 11 ug/mL — ABNORMAL LOW (ref 15–20)

## 2019-11-09 MED ORDER — VANCOMYCIN HCL 1500 MG/300ML IV SOLN
1500.0000 mg | Freq: Three times a day (TID) | INTRAVENOUS | Status: DC
  Filled 2019-11-09: qty 300

## 2019-11-09 MED ORDER — SODIUM CHLORIDE 0.9 % IV SOLN
3.0000 g | Freq: Four times a day (QID) | INTRAVENOUS | Status: DC
  Administered 2019-11-09 – 2019-11-10 (×3): 3 g via INTRAVENOUS
  Filled 2019-11-09: qty 3
  Filled 2019-11-09: qty 8
  Filled 2019-11-09 (×2): qty 3

## 2019-11-09 NOTE — Progress Notes (Signed)
Subjective:  Patient ID: Mark Stein, male    DOB: 22-Aug-1985,  MRN: [id number]  Seen bedside. Pain controlled. Objective:   Vitals:   11/09/19 1225 11/09/19 1322  BP:  (!) 168/85  Pulse: 80   Resp: 18   Temp: 99.3 F (37.4 C)   SpO2: 99%    General AA&O x3. Normal mood and affect.  Vascular Dorsalis pedis and posterior tibial pulses 2/4 bilat. Brisk capillary refill to all digits. Pedal hair present.  Neurologic Epicritic sensation grossly intact.  Dermatologic Left foot wound healing well, decreasing edema and warmth of the foot. No purulence. Resolving erythema.  Orthopedic: Motor present to the level of the digits.   Results for orders placed or performed during the hospital encounter of 11/06/19  Culture, blood (Routine x 2)     Status: None (Preliminary result)   Collection Time: 11/06/19  2:48 PM   Specimen: BLOOD  Result Value Ref Range Status   Specimen Description   Final    BLOOD RIGHT ANTECUBITAL Performed at [facility], [address], [place], [place] [address]    Special Requests   Final    BOTTLES DRAWN AEROBIC AND ANAEROBIC Blood Culture adequate volume Performed at [facility], [address], [place], [place] [address]    Culture   Final    NO GROWTH 3 DAYS Performed at [facility], [address] [place], [place] [address]    Report Status PENDING  Incomplete  Culture, blood (Routine x 2)     Status: None (Preliminary result)   Collection Time: 11/06/19  3:25 PM   Specimen: BLOOD  Result Value Ref Range Status   Specimen Description   Final    BLOOD LEFT ANTECUBITAL Performed at [facility], [address] [place], [place] [address]    Special Requests   Final    BOTTLES DRAWN AEROBIC AND ANAEROBIC Blood Culture adequate volume Performed at [facility], [address] [place], [place] [address]    Culture   Final    NO GROWTH 3 DAYS Performed at [facility], [address], [place], [place] [address]    Report Status PENDING  Incomplete  SARS Coronavirus 2 by RT PCR (hospital order, performed in [facility] hospital lab) Nasopharyngeal Nasopharyngeal Swab     Status: None   Collection Time: 11/06/19  6:52 PM   Specimen: Nasopharyngeal Swab  Result Value Ref Range Status   SARS Coronavirus 2 NEGATIVE NEGATIVE Final    Comment: (NOTE) SARS-CoV-2 target nucleic acids are NOT DETECTED.  The SARS-CoV-2 RNA is generally detectable in upper and lower respiratory specimens during the acute phase of infection. The lowest concentration of SARS-CoV-2 viral copies this assay can detect is 250 copies / mL. A negative result does not preclude SARS-CoV-2 infection and should not be used as the sole basis for treatment or other patient management decisions.  A negative result may occur with improper specimen collection / handling, submission of specimen other than nasopharyngeal swab, presence of viral mutation(s) within the areas targeted by this assay, and inadequate number of viral copies (<250 copies / mL). A negative result must be combined with clinical observations, patient history, and epidemiological information.  Fact Sheet for Patients:   BoilerBrush.com.cy  Fact Sheet for Healthcare Providers: https://pope.com/  This test is not yet approved or  cleared by the [place] FDA and has been authorized for detection and/or diagnosis of SARS-CoV-2 by  FDA under an Emergency Use Authorization (EUA).  This EUA will remain in effect (meaning this test can be used) for the duration of the COVID-19 declaration under Section 564(b)(1) of the Act, 21 U.S.C. section 360bbb-3(b)(1), unless the authorization is terminated or revoked sooner.  Performed at [facility], [address], [place], [place] [address]   Surgical pcr screen     Status: None   Collection Time:  11/07/19  7:48 AM   Specimen: Nasal Mucosa; Nasal Swab  Result Value Ref Range Status   MRSA, PCR NEGATIVE NEGATIVE Final   Staphylococcus aureus NEGATIVE NEGATIVE Final    Comment: (NOTE) The Xpert SA Assay (FDA approved for NASAL specimens in patients 80 years of age and older), is one component of a comprehensive surveillance program. It is not intended to diagnose infection nor to guide or monitor treatment. Performed at [facility], [address], [place], [place] [address]   Aerobic/Anaerobic Culture (surgical/deep wound)     Status: None   Collection Time: 11/07/19  2:01 PM   Specimen: PATH Other; Tissue  Result Value Ref Range Status   Specimen Description   Final    FOOT LEFT PLANTAR Performed at [facility], [address], [place], [place] [address]    Special Requests   Final    NONE Performed at [facility], [address] [place], [place] [address]    Gram Stain   Final    FEW WBC PRESENT, PREDOMINANTLY PMN FEW GRAM NEGATIVE RODS FEW GRAM POSITIVE COCCI IN CLUSTERS RARE GRAM POSITIVE RODS Performed at [facility], [address] [place], [place] [address]    Culture   Final    FEW GROUP A STREP (S.PYOGENES) ISOLATED WITHIN MIXED ORGANISMS MIXED ANAEROBIC FLORA PRESENT.  CALL LAB IF FURTHER IID REQUIRED.    Report Status 11/09/2019 FINAL  Final  Aerobic/Anaerobic Culture (surgical/deep wound)     Status: None (Preliminary result)   Collection Time: 11/07/19  2:01 PM   Specimen: Bone  Result Value Ref Range Status   Specimen Description BONE  Final   Special Requests PHALENX  Final   Gram Stain   Final    RARE WBC PRESENT, PREDOMINANTLY PMN FEW GRAM POSITIVE COCCI IN PAIRS IN CLUSTERS RARE GRAM POSITIVE RODS Performed at [facility], [address] [place], [place] [address]    Culture FEW GROUP A STREP (S.PYOGENES) ISOLATED  Final   Report Status PENDING  Incomplete     Assessment & Plan:  Patient was evaluated and treated and all questions answered.  Left great toe osetomyelitis s/p incision and drainage bone biopsy insertion of abx beads -Wound flushed and packed. Orders for nursing to do daily. -WBC normalized. -Has PICC. Plan will be for six weeks of IV abx given residual infected but viable bone. -Culture pending. Bone culture showing group A strep but pending -Left foot Heel touch down WB in boot with walker assist -Will continue to follow.  Park Liter, DPM  Accessible via secure chat for questions or concerns.

## 2019-11-09 NOTE — Progress Notes (Signed)
PROGRESS NOTE  Mark Stein  WUJ:[id number] DOB: 22-Jun-1985 DOA: 11/06/2019 PCP: Patient, No Pcp Per  Brief Narrative:   34 year old home dwelling African-American male Known history chronic hammering of toes status post right great to debridement closure with arthroplasty and antibiotic beads 10/16/2018 Tissue transfer on left side 02/05/2019 by Dr. Samuella Cota podiatry Left foot drop that may have started 2018 after fall tripping his and hurting his knee in 2018 Follows with Dr. Cherrie Distance of neurology (because of foot drop-patient was seen earlier this month and tells me that thought processes that patient may have had a lumbar sprain or strain causing foot drop bilaterally with an EMG and an MRI of the lower back being planned for later this month)  Former smoker current drinker Presented to podiatry office febrile (101.1) tachycardic (111) and was sent to ED by Dr. Samuella Cota  He is currently afebrile and in no distress and has somewhat insensate feet   Assessment & Plan:   Active Problems:   Osteomyelitis (HCC)   Cellulitis   Ulcer of great toe, left, with necrosis of bone (HCC)   Cellulitis and abscess of foot, except toes   1. Osteomyelitis and sepsis lower extremity a. Status post I&D + biopsy + antibiotic beads left side Dr. Samuella Cota b. Continuing empiric--ceftr/vancomycin c. PICC placed d. Await final C/s Gr +/- suscept prior to d/c with 6 weeks of Abx 2. Probable disc disease with foot drop bilaterally a. Needs outpatient follow-up with Dr. Everlena Cooper of neurology who has seen him in the past b. He was able to ambulate in the room with a walker nonweightbearing on that foot so I think he can probably go home 3. Former smoker   DVT prophylaxis: Lovenox  or other as per podiatry when to resume Code Status: Full Family Communication: None inpatient pending resolution Disposition:   Status is: Inpatient  Remains inpatient appropriate because:Ongoing active pain requiring inpatient pain  management, Unsafe d/c plan and IV treatments appropriate due to intensity of illness or inability to take PO   Dispo: The patient is from: Home              Anticipated d/c is to: Home              Anticipated d/c date is: 3 days              Patient currently is not medically stable to d/c.   Consultants:   Dr. Samuella Cota of podiatry  Procedures: I&D and foot salvaging surgery 11/07/2019  Antimicrobials: Ceftriaxone vancomycin  Subjective:  Awake alert on toilet in nad No focal deficit No fever  Objective: Vitals:   11/09/19 0437 11/09/19 0529 11/09/19 1225 11/09/19 1322  BP: (!) 145/92   (!) 168/85  Pulse: 65  80   Resp: 18  18   Temp: 97.9 F (36.6 C)  99.3 F (37.4 C)   TempSrc: Oral  Oral   SpO2: 100%  99%   Weight:  127.7 kg    Height:        Intake/Output Summary (Last 24 hours) at 11/09/2019 1619 Last data filed at 11/09/2019 1320 Gross per 24 hour  Intake 500 ml  Output 2600 ml  Net -2100 ml   Filed Weights   11/07/19 1139 11/08/19 0608 11/09/19 0529  Weight: 130.2 kg 127.7 kg 127.7 kg    Examination:  General exam: Awake coherent and ambulatory Respiratory system: Clear no added sound no rales no rhonchi Cardiovascular system: S1-S2 no murmur Gastrointestinal system: Soft  nontender. Central nervous system: Neurologically intact however does have bilateral foot drop Extremities: Soft Skin:nad Psychiatry: Euthymic congruent  Data Reviewed: I have personally reviewed following labs and imaging studies  Potassium 3.4-->3.8-->3.6 BUN/creatinine 10/0.7-->9/0.7  WBC 19.3-->13.7-->8.7 Hemoglobin 11.2-->10.9 Platelet 348-->373  Radiology Studies: [place] EKG SITE RITE  Result Date: 11/08/2019 If Site Rite image not attached, placement could not be confirmed due to current cardiac rhythm.    Scheduled Meds:  Chlorhexidine Gluconate Cloth  6 each Topical Daily   enoxaparin (LOVENOX) injection  60 mg Subcutaneous Q24H   nicotine  7 mg Transdermal  Daily   sodium chloride flush  10-40 mL Intracatheter Q12H   Continuous Infusions:  sodium chloride 40 mL/hr at 11/08/19 1541   cefTRIAXone (ROCEPHIN)  IV 2 g (11/09/19 1223)   vancomycin 1,250 mg (11/09/19 1304)     LOS: 3 days    Time spent: 15  Rhetta Mura, MD [facility] To contact the attending provider between 7A-7P or the covering provider during after hours 7P-7A, please log into the web site [email or web address] and access using universal [place] password for that web site. If you do not have the password, please call the hospital operator.  11/09/2019, 4:19 PM

## 2019-11-09 NOTE — Progress Notes (Signed)
Pharmacy Antibiotic Note  Mark Stein is a 34 y.o. male with left foot ulcer who was seen by his podiatrist Dr. Samuella Cota on 7/15 for wound check and had c/o of foot pain and fever. He was sent to the ED on 11/06/2019 for evaluation and management of infected foot.  MRI of foot showed osteomyelitis and "marked" cellulitis. He's currently on vancomycin and ceftriaxone for infection.  Today, 11/09/2019: - Day #4 Rocephin and Vancomycin - Afebrile - WBC WNL improved - SCr 0.64, stable CrCl > 100 - plan at this time is for medical management with abx vs amputation - Vanc Trough 11  Plan: - Continue ceftriaxone 2g IV q24h - Vanc trough subtherapeutic (goal 15-20) - will adjust vanc from 1250mg  IV q8 to 1500mg  IV q8 - f/u cultures and clinical status - per current culture date, Group A Strep only isolated bacteria so far, as a result, should be able to discontinue the vancomycin all together ________________________________________  Height: 6' (182.9 cm) Weight: 127.7 kg (281 lb 8.4 oz) IBW/kg (Calculated) : 77.6  Temp (24hrs), Avg:98.5 F (36.9 C), Min:97.9 F (36.6 C), Max:99.3 F (37.4 C)  Recent Labs  Lab 11/06/19 1410 11/06/19 1447 11/07/19 0415 11/08/19 0450 11/08/19 0823 11/09/19 0552 11/09/19 1100  WBC  --  25.4* 19.3*  --  13.7* 8.8  --   CREATININE  --  0.74 0.79 0.80 0.72 0.64  --   LATICACIDVEN 0.8 1.3  --   --   --   --   --   VANCOTROUGH  --   --   --   --   --   --  11*    Estimated Creatinine Clearance: 183 mL/min (by C-G formula based on SCr of 0.64 mg/dL).    No Known Allergies  Antimicrobials this admission:  7/15 Vancomycin >> 7/15 Ceftriaxone  >>   Microbiology results:  7/15 BCx x2: ngtd 7/16 left foot plantar tissue: few group A strep 7/16 phalenx bone: few group A strep  Thank you for allowing pharmacy to be a part of this patient's care.  8/16 11/09/2019 4:22 PM

## 2019-11-09 NOTE — Progress Notes (Signed)
PROGRESS NOTE  NOte for 7/17 inadverntelt Appending and not ammended--here's my note form 7/16  Gadge Hermiz  OXB:[id number] DOB: 1986-02-01 DOA: 11/06/2019 PCP: Patient, No Pcp Per  Brief Narrative:  34 year old home dwelling African-American male Known history chronic hammering of toes status post right great to debridement closure with arthroplasty and antibiotic beads 10/16/2018 Tissue transfer on left side 02/05/2019 by Dr. Samuella Cota podiatry Left foot drop that may have started 2018 after fall tripping his and hurting his knee in 2018 Follows with Dr. Cherrie Distance of neurology (because of foot drop-patient was seen earlier this month and tells me that thought processes that patient may have had a lumbar sprain or strain causing foot drop bilaterally with an EMG and an MRI of the lower back being planned for later this month)  Former smoker current drinker Presented to podiatry office febrile (101.1) tachycardic (111) and was sent to ED by Dr. Samuella Cota  He is currently afebrile and in no distress and has somewhat insensate feet   Assessment & Plan:   Active Problems:   Osteomyelitis (HCC)   Cellulitis   Ulcer of great toe, left, with necrosis of bone (HCC)   Cellulitis and abscess of foot, except toes   1. Osteomyelitis and sepsis lower extremity a. Status post IND + biopsy + antibiotic beads left side Dr. Samuella Cota b. Continuing empiric ceftriaxone vancomycin 2. Probable disc disease with foot drop bilaterally a. Needs outpatient follow-up with Dr. Cherrie Distance of neurology who has seen him in the past 3. Former smoker a. counselled to quit   DVT prophylaxis: Lovenox  or other as per podiatry when to resume Code Status: Full Family Communication: None inpatient pending resolution Disposition:   Status is: Inpatient  Remains inpatient appropriate because:Ongoing active pain requiring inpatient pain management, Unsafe d/c plan and IV treatments appropriate due to intensity of illness or  inability to take PO   Dispo: The patient is from: Home              Anticipated d/c is to: Home              Anticipated d/c date is: 3 days              Patient currently is not medically stable to d/c.   Consultants:   Dr. Samuella Cota of podiatry  Procedures: I&D and foot salvaging surgery 11/07/2019  Antimicrobials: Ceftriaxone vancomycin   Subjective: Just backl from surgery when I saw No current pain Hungry ++ Objective: Vitals:   11/08/19 1305 11/08/19 2227 11/09/19 0437 11/09/19 0529  BP: 116/80 (!) 145/95 (!) 145/92   Pulse: 89 77 65   Resp:  20 18   Temp: 98 F (36.7 C) 98.4 F (36.9 C) 97.9 F (36.6 C)   TempSrc: Oral Oral Oral   SpO2: 95% 100% 100%   Weight:    127.7 kg  Height:        Intake/Output Summary (Last 24 hours) at 11/09/2019 0729 Last data filed at 11/09/2019 0226 Gross per 24 hour  Intake 160 ml  Output 1500 ml  Net -1340 ml   Filed Weights   11/07/19 1139 11/08/19 0608 11/09/19 0529  Weight: 130.2 kg 127.7 kg 127.7 kg    Examination:  General exam: Awake coherent no distress Respiratory system: Clear no added sound no rales no rhonchi Cardiovascular system: S1-S2 no murmur Gastrointestinal system: Soft nontender. Central nervous system: Neurologically intact however does have bilateral foot drop Extremities: Soft Skin: I did not examine wound as  Dr. Samuella Cota will be examining it today Psychiatry: Euthymic congruent  Data Reviewed: I have personally reviewed following labs and imaging studies Potassium 3.4 BUN/creatinine 10/0.7  WBC 19.3 Hemoglobin 11.5 Platelet 348  Radiology Studies: DG Foot Complete Left  Result Date: 11/07/2019 CLINICAL DATA:  Status post I and D EXAM: LEFT FOOT - COMPLETE 3+ VIEW COMPARISON:  November 06, 2019 FINDINGS: The patient is status post interval placement of antibiotic beads at the amputation site of the first proximal phalanx. Overlying soft tissue swelling and surgical staples are present. No acute  osseous abnormality. IMPRESSION: Status post I and D with antibiotic bead placement at the first proximal phalanx amputation site. Electronically Signed   By: Jonna Clark M.D.   On: 11/07/2019 17:24   [place] EKG SITE RITE  Result Date: 11/08/2019 If Site Rite image not attached, placement could not be confirmed due to current cardiac rhythm.    Scheduled Meds: . Chlorhexidine Gluconate Cloth  6 each Topical Daily  . enoxaparin (LOVENOX) injection  60 mg Subcutaneous Q24H  . nicotine  7 mg Transdermal Daily  . sodium chloride flush  10-40 mL Intracatheter Q12H   Continuous Infusions: . sodium chloride 40 mL/hr at 11/08/19 1541  . cefTRIAXone (ROCEPHIN)  IV 2 g (11/08/19 1111)  . vancomycin 1,250 mg (11/09/19 0359)     LOS: 3 days    Time spent: 15  Rhetta Mura, MD [facility] To contact the attending provider between 7A-7P or the covering provider during after hours 7P-7A, please log into the web site [email or web address] and access using universal [place] password for that web site. If you do not have the password, please call the hospital operator.  11/09/2019, 7:29 AM

## 2019-11-10 ENCOUNTER — Encounter (HOSPITAL_COMMUNITY): Payer: Self-pay | Admitting: Podiatry

## 2019-11-10 ENCOUNTER — Ambulatory Visit: Payer: BC Managed Care – PPO | Admitting: Podiatry

## 2019-11-10 LAB — COMPREHENSIVE METABOLIC PANEL
ALT: 25 U/L (ref 0–44)
AST: 17 U/L (ref 15–41)
Albumin: 3 g/dL — ABNORMAL LOW (ref 3.5–5.0)
Alkaline Phosphatase: 53 U/L (ref 38–126)
Anion gap: 6 (ref 5–15)
BUN: 10 mg/dL (ref 6–20)
CO2: 28 mmol/L (ref 22–32)
Calcium: 8.4 mg/dL — ABNORMAL LOW (ref 8.9–10.3)
Chloride: 104 mmol/L (ref 98–111)
Creatinine, Ser: 0.69 mg/dL (ref 0.61–1.24)
GFR calc Af Amer: >60 mL/min (ref >60)
GFR calc non Af Amer: >60 mL/min (ref >60)
Glucose, Bld: 96 mg/dL (ref 70–99)
Potassium: 3.5 mmol/L (ref 3.5–5.1)
Sodium: 138 mmol/L (ref 135–145)
Total Bilirubin: 0.6 mg/dL (ref 0.3–1.2)
Total Protein: 7.4 g/dL (ref 6.5–8.1)

## 2019-11-10 LAB — CBC WITH DIFFERENTIAL/PLATELET
Abs Immature Granulocytes: 0.04 10*3/uL (ref 0.00–0.07)
Basophils Absolute: 0.1 10*3/uL (ref 0.0–0.1)
Basophils Relative: 1 %
Eosinophils Absolute: 0.5 10*3/uL (ref 0.0–0.5)
Eosinophils Relative: 6 %
HCT: 34.8 % — ABNORMAL LOW (ref 39.0–52.0)
Hemoglobin: 11.3 g/dL — ABNORMAL LOW (ref 13.0–17.0)
Immature Granulocytes: 1 %
Lymphocytes Relative: 22 %
Lymphs Abs: 1.8 10*3/uL (ref 0.7–4.0)
MCH: 29.3 pg (ref 26.0–34.0)
MCHC: 32.5 g/dL (ref 30.0–36.0)
MCV: 90.2 fL (ref 80.0–100.0)
Monocytes Absolute: 0.6 10*3/uL (ref 0.1–1.0)
Monocytes Relative: 8 %
Neutro Abs: 5 10*3/uL (ref 1.7–7.7)
Neutrophils Relative %: 62 %
Platelets: 393 10*3/uL (ref 150–400)
RBC: 3.86 MIL/uL — ABNORMAL LOW (ref 4.22–5.81)
RDW: 12.1 % (ref 11.5–15.5)
WBC: 8.1 10*3/uL (ref 4.0–10.5)
nRBC: 0 % (ref 0.0–0.2)

## 2019-11-10 LAB — SURGICAL PATHOLOGY

## 2019-11-10 MED ORDER — NICOTINE 7 MG/24HR TD PT24: 7.0000 mg | MEDICATED_PATCH | Freq: Every day | TRANSDERMAL | 0 refills | Status: AC

## 2019-11-10 MED ORDER — OXYCODONE-ACETAMINOPHEN 5-325 MG PO TABS: 1.0000 | ORAL_TABLET | ORAL | 0 refills | Status: DC | PRN

## 2019-11-10 MED ORDER — CEFTRIAXONE IV (FOR PTA / DISCHARGE USE ONLY): 2.0000 g | INTRAVENOUS | 0 refills | Status: AC

## 2019-11-10 MED ORDER — METRONIDAZOLE 500 MG PO TABS
500.0000 mg | ORAL_TABLET | Freq: Three times a day (TID) | ORAL | 0 refills | Status: DC
Start: 2019-11-10 — End: 2019-12-22

## 2019-11-10 MED ORDER — HEPARIN SOD (PORK) LOCK FLUSH 100 UNIT/ML IV SOLN
250.0000 [IU] | INTRAVENOUS | Status: AC | PRN
  Administered 2019-11-10: 250 [IU]
  Filled 2019-11-10: qty 2.5

## 2019-11-10 MED ORDER — AMPICILLIN-SULBACTAM IV (FOR PTA / DISCHARGE USE ONLY)
3.0000 g | Freq: Four times a day (QID) | INTRAVENOUS | 0 refills | Status: DC
Start: 2019-11-10 — End: 2019-11-10

## 2019-11-10 MED ORDER — SODIUM CHLORIDE 0.9 % IV SOLN
2.0000 g | INTRAVENOUS | Status: DC
  Administered 2019-11-10: 2 g via INTRAVENOUS
  Filled 2019-11-10: qty 2

## 2019-11-10 MED ORDER — SODIUM CHLORIDE 0.9 % IV SOLN: 10.0000 mL | INTRAVENOUS | 0 refills | Status: AC

## 2019-11-10 NOTE — Discharge Summary (Signed)
Physician Discharge Summary  Mark Stein NUU:[id number] DOB: Sep 06, 1985 DOA: 11/06/2019  PCP: Patient, No Pcp Per  Admit date: 11/06/2019 Discharge date: 11/10/2019  Time spent: 45 minutes  Recommendations for Outpatient Follow-up:  1. Will need Unasyn for 6 weeks ending 12/17/2019 or as per directed by infectious disease as well as podiatrist Dr. Samuella Cota 2. I will CC in addition  infectious disease physician Dr. Orvan Falconer potential follow-up was arranged 3. Follow final cultures in the outpatient setting but should be susceptible to Unasyn 4. Requires labs as per protocol 5. Further outpatient management as per podiatrist 6. TOC to schedule outpatient [facility] at [facility] as does not have PCP 7. Consider addition of antihypertensive in the outpatient setting  Discharge Diagnoses:  Active Problems:   Osteomyelitis (HCC)   Cellulitis   Ulcer of great toe, left, with necrosis of bone (HCC)   Cellulitis and abscess of foot, except toes   Discharge Condition: Improved  Diet recommendation: Regular  Filed Weights   11/08/19 0608 11/09/19 0529 11/10/19 0406  Weight: 127.7 kg 127.7 kg 127.6 kg    History of present illness:  34 year old home dwelling African-American male Known history chronic hammering of toes status post right great to debridement closure with arthroplasty and antibiotic beads 10/16/2018 Tissue transfer on left side 02/05/2019 by Dr. Samuella Cota podiatry Left foot drop that may have started 2018 after fall tripping his and hurting his knee in 2018 Follows with Dr. Cherrie Distance of neurology(because of foot drop-patient was seen earlier this month and tells me that thought processes that patient may have had a lumbar sprain or strain causing foot drop bilaterally with an EMG and an MRI of the lower back being planned for later this month) Former smoker current drinker Presented to podiatry office febrile (101.1) tachycardic (111) and was sent to ED by Dr. Samuella Cota  He is  currently afebrile and in no distress and has somewhat insensate feet  Hospital Course:  1. Osteomyelitis and sepsis lower extremity a. Status post I&D + biopsy + antibiotic beads left side Dr. Samuella Cota b. Initially was on empiric--ceftr/vancomycin c.  final C/s Gr + cocci with some anaerobes therefore d/c with 6 weeks of Abx Unasyn on d/c and OP follow up arranged by DPM 2. Probable disc disease with foot drop bilaterally a. Needs outpatient follow-up with Dr. Everlena Cooper of neurology who has seen him in the past b. He was able to ambulate in the room with a walker nonweightbearing on that foot so I think he can probably go home 3. Former smoker   Procedures:  Incision and drainage  Consultations:  Dr. Samuella Cota of podiatry  Discharge Exam: Vitals:   11/09/19 2228 11/10/19 0616  BP: 139/79 (!) 152/95  Pulse: 73 62  Resp: 20 18  Temp: 98.6 F (37 C) 98.2 F (36.8 C)  SpO2: 100% 100%    General: Awake pleasant alert oriented black male no distress Cardiovascular: S1-S2 no murmur rub or gallop Respiratory: Clinically clear no added sound  wound exam       Discharge Instructions   Discharge Instructions    Diet - low sodium heart healthy   Complete by: As directed    Discharge instructions   Complete by: As directed    Follow-up with Dr. Samuella Cota in the outpatient setting I will refer you to Dr. Ilsa Iha of infectious disease who will follow your care and help adjust antibiotics per protocol I do think you will do well but please report any increased swelling purulence etc.  etc. to your doctor   Discharge wound care:   Complete by: As directed    As per Dr. Samuella Cota of podiatry   Increase activity slowly   Complete by: As directed      Allergies as of 11/10/2019   No Known Allergies     Medication List    STOP taking these medications   Santyl ointment Generic drug: collagenase     TAKE these medications   acetaminophen 500 MG tablet Commonly known as:  TYLENOL Take 500 mg by mouth every 6 (six) hours as needed for moderate pain.   nicotine 7 mg/24hr patch Commonly known as: NICODERM CQ - dosed in mg/24 hr Place 1 patch (7 mg total) onto the skin daily.   oxyCODONE-acetaminophen 5-325 MG tablet Commonly known as: Percocet Take 1 tablet by mouth every 4 (four) hours as needed for severe pain.   silver sulfADIAZINE 1 % cream Commonly known as: Silvadene Apply pea-sized amount to wound daily.   sodium chloride 0.9 % infusion Inject 10 mLs into the vein continuous.            Discharge Care Instructions  (From admission, onward)         Start     Ordered   11/10/19 0000  Discharge wound care:       Comments: As per Dr. Samuella Cota of podiatry   11/10/19 [phone]         No Known Allergies    The results of significant diagnostics from this hospitalization (including imaging, microbiology, ancillary and laboratory) are listed below for reference.    Significant Diagnostic Studies: MR FOOT LEFT W WO CONTRAST  Result Date: 11/06/2019 CLINICAL DATA:  History of prior left foot surgery 10/16/2018, fever, tachycardia, left foot swelling EXAM: MRI OF THE LEFT FOREFOOT WITHOUT AND WITH CONTRAST TECHNIQUE: Multiplanar, multisequence MR imaging of the left foot was performed both before and after administration of intravenous contrast. CONTRAST:  39mL GADAVIST GADOBUTROL 1 MMOL/ML IV SOLN COMPARISON:  11/06/2019 FINDINGS: Bones/Joint/Cartilage Postsurgical changes are seen from prior resection of the distal margin of the first proximal phalanx. The remaining first proximal phalanx demonstrates abnormal marrow edema consistent with osteomyelitis. The remaining visualized bony structures demonstrate normal signal characteristics. There is prominent osteoarthritis of the first metatarsophalangeal joint. Ligaments No acute abnormalities. Muscles and Tendons There is fluid within the tendon sheath of the flexor hallucis longus, consistent with  tenosynovitis. Soft tissues There is diffuse edema within the first digit, with evidence of focal ulceration plantar aspect at the level of the metatarsophalangeal joint. There is diffuse abnormal enhancement throughout the first digit plantar aspect. No fluid collection or abscess. IMPRESSION: 1. Abnormal marrow edema and enhancement within the residual portions of the first proximal phalanx, consistent with osteomyelitis given clinical presentation. 2. Ulceration, edema, and enhancement of the soft tissues plantar aspect first digit, consistent with marked cellulitis. No fluid collection or abscess. 3. Tenosynovitis of the flexor hallucis longus tendon. Electronically Signed   By: Sharlet Salina M.D.   On: 11/06/2019 18:54   DG Chest Portable 1 View  Result Date: 11/06/2019 CLINICAL DATA:  Fever EXAM: PORTABLE CHEST 1 VIEW COMPARISON:  02/11/2018 FINDINGS: The heart size and mediastinal contours are within normal limits. Both lungs are clear. The visualized skeletal structures are unremarkable. IMPRESSION: No active disease. Electronically Signed   By: Helyn Numbers MD   On: 11/06/2019 15:53   DG Foot Complete Left  Result Date: 11/07/2019 CLINICAL DATA:  Status post I and  D EXAM: LEFT FOOT - COMPLETE 3+ VIEW COMPARISON:  November 06, 2019 FINDINGS: The patient is status post interval placement of antibiotic beads at the amputation site of the first proximal phalanx. Overlying soft tissue swelling and surgical staples are present. No acute osseous abnormality. IMPRESSION: Status post I and D with antibiotic bead placement at the first proximal phalanx amputation site. Electronically Signed   By: Jonna ClarkBindu  Avutu M.D.   On: 11/07/2019 17:24   DG Foot Complete Left  Result Date: 11/06/2019 Please see detailed radiograph report in office note.  US EKG SITE RITE  Result Date: 11/08/2019 If Site Rite image not attached, placement could not be confirmed due to current cardiac rhythm.   Microbiology: Recent  Results (from the past 240 hour(s))  Culture, blood (Routine x 2)     Status: None (Preliminary result)   Collection Time: 11/06/19  2:48 PM   Specimen: BLOOD  Result Value Ref Range Status   Specimen Description   Final    BLOOD RIGHT ANTECUBITAL Performed at [facility], [address] [place], [place] [address]    Special Requests   Final    BOTTLES DRAWN AEROBIC AND ANAEROBIC Blood Culture adequate volume Performed at [facility], [address] [place], [place] [address]    Culture   Final    NO GROWTH 3 DAYS Performed at [facility], [address] [place], [place] [address]    Report Status PENDING  Incomplete  Culture, blood (Routine x 2)     Status: None (Preliminary result)   Collection Time: 11/06/19  3:25 PM   Specimen: BLOOD  Result Value Ref Range Status   Specimen Description   Final    BLOOD LEFT ANTECUBITAL Performed at [facility], [address] [place], [place] [address]    Special Requests   Final    BOTTLES DRAWN AEROBIC AND ANAEROBIC Blood Culture adequate volume Performed at [facility], [address] [place], [place] [address]    Culture   Final    NO GROWTH 3 DAYS Performed at [facility], [address] [place], [place] [address]    Report Status PENDING  Incomplete  SARS Coronavirus 2 by RT PCR (hospital order, performed in [facility] hospital lab) Nasopharyngeal Nasopharyngeal Swab     Status: None   Collection Time: 11/06/19  6:52 PM   Specimen: Nasopharyngeal Swab  Result Value Ref Range Status   SARS Coronavirus 2 NEGATIVE NEGATIVE Final    Comment: (NOTE) SARS-CoV-2 target nucleic acids are NOT DETECTED.  The SARS-CoV-2 RNA is generally detectable in upper and lower respiratory specimens during the acute phase of infection. The lowest concentration of SARS-CoV-2 viral copies this assay can detect is 250 copies / mL. A negative result does  not preclude SARS-CoV-2 infection and should not be used as the sole basis for treatment or other patient management decisions.  A negative result may occur with improper specimen collection / handling, submission of specimen other than nasopharyngeal swab, presence of viral mutation(s) within the areas targeted by this assay, and inadequate number of viral copies (<250 copies / mL). A negative result must be combined with clinical observations, patient history, and epidemiological information.  Fact Sheet for Patients:   BoilerBrush.com.cyhttps://www.fda.gov/media/136312/download  Fact Sheet for Healthcare Providers: https://pope.com/https://www.fda.gov/media/136313/download  This test is not yet approved or  cleared by the [place] FDA and has been authorized for detection and/or diagnosis of SARS-CoV-2  by FDA under an Emergency Use Authorization (EUA).  This EUA will remain in effect (meaning this test can be used) for the duration of the COVID-19 declaration under Section 564(b)(1) of the Act, 21 U.S.C. section 360bbb-3(b)(1), unless the authorization is terminated or revoked sooner.  Performed at [facility], [address], [place], [place] [address]   Surgical pcr screen     Status: None   Collection Time: 11/07/19  7:48 AM   Specimen: Nasal Mucosa; Nasal Swab  Result Value Ref Range Status   MRSA, PCR NEGATIVE NEGATIVE Final   Staphylococcus aureus NEGATIVE NEGATIVE Final    Comment: (NOTE) The Xpert SA Assay (FDA approved for NASAL specimens in patients 68 years of age and older), is one component of a comprehensive surveillance program. It is not intended to diagnose infection nor to guide or monitor treatment. Performed at [facility], [address], [place], [place] [address]   Aerobic/Anaerobic Culture (surgical/deep wound)     Status: None   Collection Time: 11/07/19  2:01 PM   Specimen: PATH Other; Tissue  Result Value Ref Range Status   Specimen  Description   Final    FOOT LEFT PLANTAR Performed at [facility], [address], [place], [place] [address]    Special Requests   Final    NONE Performed at [facility], [address] [place], [place] [address]    Gram Stain   Final    FEW WBC PRESENT, PREDOMINANTLY PMN FEW GRAM NEGATIVE RODS FEW GRAM POSITIVE COCCI IN CLUSTERS RARE GRAM POSITIVE RODS Performed at [facility], [address], [place], [place] [address]    Culture   Final    FEW GROUP A STREP (S.PYOGENES) ISOLATED WITHIN MIXED ORGANISMS MIXED ANAEROBIC FLORA PRESENT.  CALL LAB IF FURTHER IID REQUIRED.    Report Status 11/09/2019 FINAL  Final  Aerobic/Anaerobic Culture (surgical/deep wound)     Status: None (Preliminary result)   Collection Time: 11/07/19  2:01 PM   Specimen: Bone  Result Value Ref Range Status   Specimen Description BONE  Final   Special Requests PHALENX  Final   Gram Stain   Final    RARE WBC PRESENT, PREDOMINANTLY PMN FEW GRAM POSITIVE COCCI IN PAIRS IN CLUSTERS RARE GRAM POSITIVE RODS Performed at [facility], [address], [place], [place] [address]    Culture FEW GROUP A STREP (S.PYOGENES) ISOLATED  Final   Report Status PENDING  Incomplete     Labs: Basic Metabolic Panel: Recent Labs  Lab 11/06/19 1447 11/06/19 1447 11/07/19 0415 11/08/19 0450 11/08/19 0823 11/09/19 0552 11/10/19 0403  NA 137  --  135  --  137 137 138  K 3.7  --  3.4*  --  3.8 3.6 3.5  CL 102  --  101  --  103 103 104  CO2 23  --  24  --  24 25 28   GLUCOSE 100*  --  94  --  97 93 96  BUN 11  --  10  --  9 8 10   CREATININE 0.74   < > 0.79 0.80 0.72 0.64 0.69  CALCIUM 8.8*  --  8.4*  --  8.4* 8.3* 8.4*   < > = values in this interval not displayed.   Liver Function Tests: Recent Labs  Lab 11/06/19 1447 11/07/19 0415 11/08/19 0823 11/09/19 0552 11/10/19 0403  AST 19 14* 18 17 17   ALT  ALKPHOS 67 55 54 51 53  BILITOT 1.4*  1.4* 0.9 0.7 0.6  PROT 8.5* 7.0 7.2 7.2 7.4  ALBUMIN 3.9 3.1* 3.0* 2.9* 3.0*   No results for input(s): LIPASE, AMYLASE in the last 168 hours. No results for input(s): AMMONIA in the last 168 hours. CBC: Recent Labs  Lab 11/06/19 1447 11/07/19 0415 11/08/19 0823 11/09/19 0552 11/10/19 0403  WBC 25.4* 19.3* 13.7* 8.8 8.1  NEUTROABS 22.3* 15.9* 10.9* 5.9 5.0  HGB 13.3 11.5* 11.2* 10.9* 11.3*  HCT 40.6 35.3* 34.4* 33.4* 34.8*  MCV 89.8 91.2 90.3 90.3 90.2  PLT 353 322 348 373 393   Cardiac Enzymes: No results for input(s): CKTOTAL, CKMB, CKMBINDEX, TROPONINI in the last 168 hours. BNP: BNP (last 3 results) No results for input(s): BNP in the last 8760 hours.  ProBNP (last 3 results) No results for input(s): PROBNP in the last 8760 hours.  CBG: No results for input(s): GLUCAP in the last 168 hours.     Signed:  Rhetta Mura MD   [facility] 11/10/2019, 8:41 AM

## 2019-11-10 NOTE — TOC Transition Note (Signed)
Transition of Care [facility]) - CM/SW Discharge Note   Patient Details  Name: Mark Stein MRN: [id number] Date of Birth: 10-06-1985  Transition of Care [facility]) CM/SW Contact:  Darleene Cleaver, LCSW Phone Number: 11/10/2019, 5:51 PM   Clinical Narrative:     Patient will be discharging with home IV antibiotics.  CSW was unable to find a home health agency that is able to accept patient for [facility] RN due to his insurance.  Jeri Modena, from the IV infusion team was able to set up and train patient on IV antibiotics at home, she will also have their infusion team will have a nurse to follow.  CSW signing off patient will be discharging today back home with his wife.   Final next level of care: Home/Self Care Barriers to Discharge: Barriers Resolved   Patient Goals and CMS Choice Patient states their goals for this hospitalization and ongoing recovery are:: To return back home with IV antibiotics. CMS Medicare.gov Compare Post Acute Care list provided to:: Patient Choice offered to / list presented to : Patient  Discharge Placement                       Discharge Plan and Services                DME Arranged: Other see comment (Patient will be going home with IV antibiotics.) DME Agency: Other - Comment Date DME Agency Contacted: 11/10/19 Time DME Agency Contacted: 1000 Representative spoke with at DME Agency: Jeri Modena HH Arranged: RN, IV Antibiotics HH Agency: [facility] Date HH Agency Contacted: 11/10/19 Time HH Agency Contacted: 1200 Representative spoke with at [facility] Agency: Madison Hickman  Social Determinants of Health (SDOH) Interventions     Readmission Risk Interventions No flowsheet data found.

## 2019-11-10 NOTE — Progress Notes (Signed)
PHARMACY CONSULT NOTE FOR:  OUTPATIENT  PARENTERAL ANTIBIOTIC THERAPY (OPAT) - UPDATED  Indication: left great toe osteomyelitis Regimen: Ceftriaxone 2gm IV q24h (and metronidazole 500mg  PO TID) End date: 12/17/2019  Patient instructed to avoid alcohol with metronidazole  IV antibiotic discharge orders are pended. To discharging provider:  please sign these orders via discharge navigator,  Select New Orders & click on the button choice - Manage This Unsigned Work.     Thank you for allowing pharmacy to be a part of this patient's care.  12/19/2019, PharmD, BCPS.   Work Cell: [phone] 11/10/2019 2:27 PM

## 2019-11-10 NOTE — Progress Notes (Signed)
RN updated patient r/t late discharge today and relay message from IV CM (P.C.). Patient expressed appreciation.

## 2019-11-10 NOTE — Progress Notes (Signed)
PHARMACY CONSULT NOTE FOR:  OUTPATIENT  PARENTERAL ANTIBIOTIC THERAPY (OPAT)  Indication: left great toe osteomyelitis Regimen: unasyn 3gm IV q6h End date: 12/17/2019  IV antibiotic discharge orders are pended. To discharging provider:  please sign these orders via discharge navigator,  Select New Orders & click on the button choice - Manage This Unsigned Work.     Thank you for allowing pharmacy to be a part of this patient's care.  Lucia Gaskins 11/10/2019, 9:21 AM

## 2019-11-10 NOTE — Progress Notes (Signed)
Patient with d/c order, dressing changed today 11/10/19. Patient denied pain and declined breakfast. IV consulted to CAP/LOCK PICC for home.

## 2019-11-11 ENCOUNTER — Other Ambulatory Visit: Payer: Self-pay | Admitting: Podiatry

## 2019-11-11 DIAGNOSIS — L0390 Cellulitis, unspecified: Secondary | ICD-10-CM | POA: Diagnosis not present

## 2019-11-11 DIAGNOSIS — L02619 Cutaneous abscess of unspecified foot: Secondary | ICD-10-CM | POA: Diagnosis not present

## 2019-11-11 DIAGNOSIS — L97524 Non-pressure chronic ulcer of other part of left foot with necrosis of bone: Secondary | ICD-10-CM | POA: Diagnosis not present

## 2019-11-11 DIAGNOSIS — M869 Osteomyelitis, unspecified: Secondary | ICD-10-CM | POA: Diagnosis not present

## 2019-11-11 LAB — CULTURE, BLOOD (ROUTINE X 2)
Culture: NO GROWTH
Culture: NO GROWTH
Special Requests: ADEQUATE
Special Requests: ADEQUATE

## 2019-11-11 MED ORDER — OXYCODONE-ACETAMINOPHEN 5-325 MG PO TABS: 1.0000 | ORAL_TABLET | ORAL | 0 refills | Status: DC | PRN

## 2019-11-11 NOTE — Progress Notes (Signed)
Rx sent to pharmacy for outpatient surgery. °

## 2019-11-13 LAB — AEROBIC/ANAEROBIC CULTURE W GRAM STAIN (SURGICAL/DEEP WOUND)

## 2019-11-14 ENCOUNTER — Other Ambulatory Visit: Payer: Self-pay

## 2019-11-14 ENCOUNTER — Ambulatory Visit (INDEPENDENT_AMBULATORY_CARE_PROVIDER_SITE_OTHER): Payer: BC Managed Care – PPO | Admitting: Podiatry

## 2019-11-14 ENCOUNTER — Telehealth: Payer: Self-pay

## 2019-11-14 VITALS — BP 116/115 | HR 101 | Temp 98.1°F

## 2019-11-14 DIAGNOSIS — M86171 Other acute osteomyelitis, right ankle and foot: Secondary | ICD-10-CM

## 2019-11-14 DIAGNOSIS — L97522 Non-pressure chronic ulcer of other part of left foot with fat layer exposed: Secondary | ICD-10-CM

## 2019-11-14 DIAGNOSIS — L03119 Cellulitis of unspecified part of limb: Secondary | ICD-10-CM

## 2019-11-14 DIAGNOSIS — L02619 Cutaneous abscess of unspecified foot: Secondary | ICD-10-CM

## 2019-11-14 NOTE — Progress Notes (Signed)
  Subjective:  Patient ID: Mark Stein, male    DOB: October 28, 1985,  MRN: [id number]  Chief Complaint  Patient presents with  . Routine Post Op     POV#1 DOS 11/07/19 - IRRIGATION AND DEBRIDEMENT LEFT GREAT TOE WITH BONE BIOPSY, INSERTION OF ANTIBIOTIC BEADS. Pt stated, "Not much pain. I'm changing the dressing every day. Wearing the boot". No fever/chills/N&V/foul odor/pus. PICC line in place.    DOS: 11/07/19 Procedure: Debridement irrigation left great toe with bone biopsy insertion of antibiotic beads   34 y.o. male presents with the above complaint. History confirmed with patient.  Doing well changing the packing himself.  Denies issues postoperatively.  Taking the antibiotics through his PICC line  Objective:  Physical Exam: tenderness at the surgical site, local edema noted and calf supple, nontender. Incision: healing well, no significant erythema, slight clear drainage present.  Packing in place prior to removal.  Antibiotic beads present in wound bed  Assessment:   1. Skin ulcer of left foot with fat layer exposed (HCC)   2. Cellulitis and abscess of foot, except toes   3. Acute osteomyelitis of toe, right (HCC)     Plan:  Patient was evaluated and treated and all questions answered.  Post-operative State -Packing removed wound flushed packing replaced -Discussed with patient the next steps for salvage of his toe.  Discussed proceeding next week with exchange of the antibiotic beads and wound debridement and closure.  We will plan for packing with absorbable antibiotic beads.  Consent forms reviewed and signed by patient.  Patient was just previously hospitalized and will not need updated H&P form. -Patient has failed all conservative therapy and wishes to proceed with surgical intervention. All risks, benefits, and alternatives discussed with patient. No guarantees given. Consent reviewed and signed by patient. -Planned procedures: Left great toe debridement irrigation,  antibiotic bead exchange, wound closure  No follow-ups on file.

## 2019-11-14 NOTE — Telephone Encounter (Signed)
DOS 11/21/2019  WOUND DEBRIDEMENT LT - 11042 WOUND CLOSURE LT - 13132 [id number]  [employer] EFFECTIVE DATE - 04/24/2018  PLAN DEDUCTIBLE - $5000.00 W/ $2107.00 REMAINING OUT OF POCKET - $7150.00 W/ $4590.00 REMAINING COPAY $0.00 COINSURANCE - 30%  SPOKE TO MIRANDA AT [employer], SHE STATED NO ORDER REQUIRED FOR CPT 11042, [address] [place] [address]. CALL REF NUMBER - MIRANDA 11/14/2019

## 2019-11-14 NOTE — H&P (View-Only) (Signed)
  Subjective:  Patient ID: Mark Stein, male    DOB: 04/09/1986,  MRN: [id number]  Chief Complaint  Patient presents with  . Routine Post Op     POV#1 DOS 11/07/19 - IRRIGATION AND DEBRIDEMENT LEFT GREAT TOE WITH BONE BIOPSY, INSERTION OF ANTIBIOTIC BEADS. Pt stated, "Not much pain. I'm changing the dressing every day. Wearing the boot". No fever/chills/N&V/foul odor/pus. PICC line in place.    DOS: 11/07/19 Procedure: Debridement irrigation left great toe with bone biopsy insertion of antibiotic beads   34 y.o. male presents with the above complaint. History confirmed with patient.  Doing well changing the packing himself.  Denies issues postoperatively.  Taking the antibiotics through his PICC line  Objective:  Physical Exam: tenderness at the surgical site, local edema noted and calf supple, nontender. Incision: healing well, no significant erythema, slight clear drainage present.  Packing in place prior to removal.  Antibiotic beads present in wound bed  Assessment:   1. Skin ulcer of left foot with fat layer exposed (HCC)   2. Cellulitis and abscess of foot, except toes   3. Acute osteomyelitis of toe, right (HCC)     Plan:  Patient was evaluated and treated and all questions answered.  Post-operative State -Packing removed wound flushed packing replaced -Discussed with patient the next steps for salvage of his toe.  Discussed proceeding next week with exchange of the antibiotic beads and wound debridement and closure.  We will plan for packing with absorbable antibiotic beads.  Consent forms reviewed and signed by patient.  Patient was just previously hospitalized and will not need updated H&P form. -Patient has failed all conservative therapy and wishes to proceed with surgical intervention. All risks, benefits, and alternatives discussed with patient. No guarantees given. Consent reviewed and signed by patient. -Planned procedures: Left great toe debridement irrigation,  antibiotic bead exchange, wound closure  No follow-ups on file.   

## 2019-11-15 ENCOUNTER — Emergency Department (HOSPITAL_COMMUNITY)
Admission: EM | Admit: 2019-11-15 | Discharge: 2019-11-15 | Disposition: A | Discharge status: Home / Self Care | Payer: BC Managed Care – PPO | Attending: Emergency Medicine | Admitting: Emergency Medicine

## 2019-11-15 ENCOUNTER — Other Ambulatory Visit: Payer: Self-pay

## 2019-11-15 ENCOUNTER — Encounter (HOSPITAL_COMMUNITY): Payer: Self-pay

## 2019-11-15 DIAGNOSIS — Z5321 Procedure and treatment not carried out due to patient leaving prior to being seen by health care provider: Secondary | ICD-10-CM | POA: Diagnosis not present

## 2019-11-15 DIAGNOSIS — M79621 Pain in right upper arm: Secondary | ICD-10-CM | POA: Diagnosis not present

## 2019-11-15 NOTE — ED Triage Notes (Signed)
He tells me he had a "surgery" Friday on his left great toe. He tells me he slipped and fel in his driveway this morning, thereby jarring the tow slightly. He is here to have it "checked". His post-op boot is in place and I see nothing amiss with his boot or dressing. He has a right upper arm P.I.C.C. line.

## 2019-11-16 DIAGNOSIS — L02619 Cutaneous abscess of unspecified foot: Secondary | ICD-10-CM | POA: Diagnosis not present

## 2019-11-16 DIAGNOSIS — L97524 Non-pressure chronic ulcer of other part of left foot with necrosis of bone: Secondary | ICD-10-CM | POA: Diagnosis not present

## 2019-11-16 DIAGNOSIS — M869 Osteomyelitis, unspecified: Secondary | ICD-10-CM | POA: Diagnosis not present

## 2019-11-16 DIAGNOSIS — L0390 Cellulitis, unspecified: Secondary | ICD-10-CM | POA: Diagnosis not present

## 2019-11-18 ENCOUNTER — Encounter (HOSPITAL_COMMUNITY)
Admission: RE | Admit: 2019-11-18 | Discharge: 2019-11-18 | Disposition: A | Discharge status: Home / Self Care | Payer: BC Managed Care – PPO | Source: Ambulatory Visit | Attending: Podiatry | Admitting: Podiatry

## 2019-11-18 ENCOUNTER — Other Ambulatory Visit: Payer: Self-pay

## 2019-11-18 ENCOUNTER — Encounter (HOSPITAL_COMMUNITY): Payer: Self-pay

## 2019-11-18 ENCOUNTER — Other Ambulatory Visit (HOSPITAL_COMMUNITY)
Admission: RE | Admit: 2019-11-18 | Discharge: 2019-11-18 | Disposition: A | Discharge status: Home / Self Care | Payer: BC Managed Care – PPO | Source: Ambulatory Visit | Attending: Podiatry | Admitting: Podiatry

## 2019-11-18 DIAGNOSIS — L0390 Cellulitis, unspecified: Secondary | ICD-10-CM | POA: Diagnosis not present

## 2019-11-18 DIAGNOSIS — L98491 Non-pressure chronic ulcer of skin of other sites limited to breakdown of skin: Secondary | ICD-10-CM | POA: Diagnosis not present

## 2019-11-18 DIAGNOSIS — L02619 Cutaneous abscess of unspecified foot: Secondary | ICD-10-CM | POA: Diagnosis not present

## 2019-11-18 DIAGNOSIS — Z01812 Encounter for preprocedural laboratory examination: Secondary | ICD-10-CM | POA: Diagnosis not present

## 2019-11-18 DIAGNOSIS — M869 Osteomyelitis, unspecified: Secondary | ICD-10-CM | POA: Diagnosis not present

## 2019-11-18 DIAGNOSIS — Z20822 Contact with and (suspected) exposure to covid-19: Secondary | ICD-10-CM | POA: Insufficient documentation

## 2019-11-18 DIAGNOSIS — L97524 Non-pressure chronic ulcer of other part of left foot with necrosis of bone: Secondary | ICD-10-CM | POA: Diagnosis not present

## 2019-11-18 LAB — SARS CORONAVIRUS 2 (TAT 6-24 HRS): SARS Coronavirus 2: NEGATIVE

## 2019-11-20 MED ORDER — DEXTROSE 5 % IV SOLN
3.0000 g | INTRAVENOUS | Status: AC
  Administered 2019-11-21: 3 g via INTRAVENOUS
  Filled 2019-11-20: qty 3

## 2019-11-21 ENCOUNTER — Encounter (HOSPITAL_COMMUNITY)
Admission: RE | Disposition: A | Discharge status: Home / Self Care | Payer: Self-pay | Source: Home / Self Care | Attending: Podiatry

## 2019-11-21 ENCOUNTER — Ambulatory Visit (HOSPITAL_COMMUNITY): Payer: BC Managed Care – PPO

## 2019-11-21 ENCOUNTER — Encounter (HOSPITAL_COMMUNITY): Payer: Self-pay | Admitting: Podiatry

## 2019-11-21 ENCOUNTER — Ambulatory Visit (HOSPITAL_COMMUNITY): Payer: BC Managed Care – PPO | Admitting: Certified Registered Nurse Anesthetist

## 2019-11-21 ENCOUNTER — Ambulatory Visit (HOSPITAL_COMMUNITY)
Admission: RE | Admit: 2019-11-21 | Discharge: 2019-11-21 | Disposition: A | Discharge status: Home / Self Care | Payer: BC Managed Care – PPO | Attending: Podiatry | Admitting: Podiatry

## 2019-11-21 DIAGNOSIS — Z481 Encounter for planned postprocedural wound closure: Secondary | ICD-10-CM

## 2019-11-21 DIAGNOSIS — E669 Obesity, unspecified: Secondary | ICD-10-CM | POA: Diagnosis not present

## 2019-11-21 DIAGNOSIS — M86171 Other acute osteomyelitis, right ankle and foot: Secondary | ICD-10-CM | POA: Diagnosis not present

## 2019-11-21 DIAGNOSIS — L97514 Non-pressure chronic ulcer of other part of right foot with necrosis of bone: Secondary | ICD-10-CM | POA: Diagnosis not present

## 2019-11-21 DIAGNOSIS — M86672 Other chronic osteomyelitis, left ankle and foot: Secondary | ICD-10-CM | POA: Diagnosis not present

## 2019-11-21 DIAGNOSIS — Z79899 Other long term (current) drug therapy: Secondary | ICD-10-CM | POA: Diagnosis not present

## 2019-11-21 DIAGNOSIS — M869 Osteomyelitis, unspecified: Secondary | ICD-10-CM | POA: Diagnosis not present

## 2019-11-21 DIAGNOSIS — Z9889 Other specified postprocedural states: Secondary | ICD-10-CM

## 2019-11-21 DIAGNOSIS — M86172 Other acute osteomyelitis, left ankle and foot: Secondary | ICD-10-CM | POA: Diagnosis not present

## 2019-11-21 DIAGNOSIS — M21372 Foot drop, left foot: Secondary | ICD-10-CM | POA: Diagnosis not present

## 2019-11-21 DIAGNOSIS — A419 Sepsis, unspecified organism: Secondary | ICD-10-CM | POA: Diagnosis not present

## 2019-11-21 DIAGNOSIS — M21371 Foot drop, right foot: Secondary | ICD-10-CM | POA: Diagnosis not present

## 2019-11-21 DIAGNOSIS — Z6838 Body mass index (BMI) 38.0-38.9, adult: Secondary | ICD-10-CM | POA: Diagnosis not present

## 2019-11-21 DIAGNOSIS — Z89412 Acquired absence of left great toe: Secondary | ICD-10-CM | POA: Diagnosis not present

## 2019-11-21 DIAGNOSIS — S91302A Unspecified open wound, left foot, initial encounter: Secondary | ICD-10-CM | POA: Diagnosis not present

## 2019-11-21 DIAGNOSIS — L97524 Non-pressure chronic ulcer of other part of left foot with necrosis of bone: Secondary | ICD-10-CM | POA: Insufficient documentation

## 2019-11-21 DIAGNOSIS — L02619 Cutaneous abscess of unspecified foot: Secondary | ICD-10-CM | POA: Diagnosis not present

## 2019-11-21 DIAGNOSIS — Z87891 Personal history of nicotine dependence: Secondary | ICD-10-CM | POA: Insufficient documentation

## 2019-11-21 DIAGNOSIS — L0390 Cellulitis, unspecified: Secondary | ICD-10-CM | POA: Diagnosis not present

## 2019-11-21 DIAGNOSIS — M7989 Other specified soft tissue disorders: Secondary | ICD-10-CM | POA: Diagnosis not present

## 2019-11-21 HISTORY — PX: WOUND DEBRIDEMENT: SHX247

## 2019-11-21 IMAGING — DX DG FOOT 2V*L*
2 series · 2 of 2 positions shown · non-contrast
Comparison: [DATE]

CLINICAL DATA: Wound debridement and antibiotic spacer exchange

EXAM:
LEFT FOOT - 2 VIEW

[foot ap]
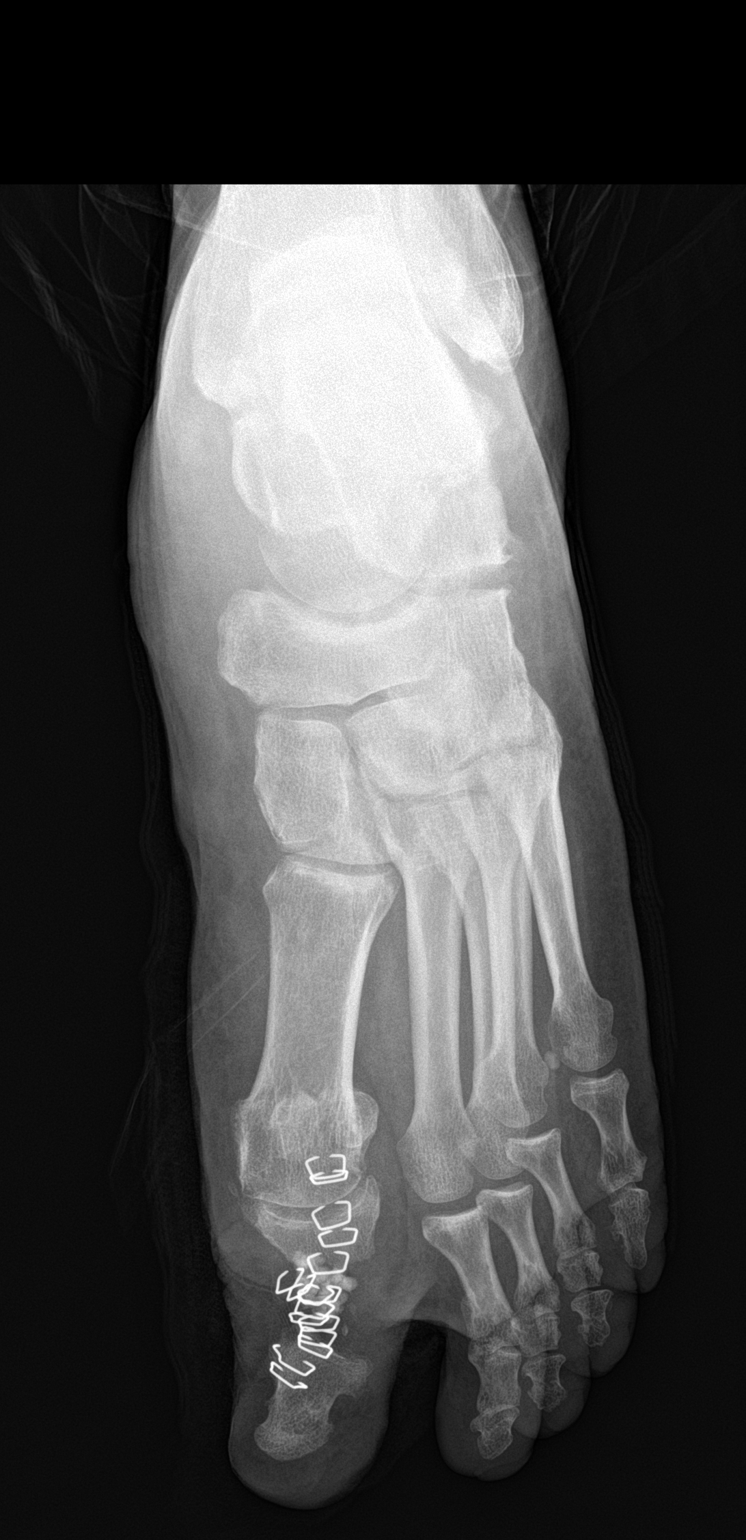

[foot lat]
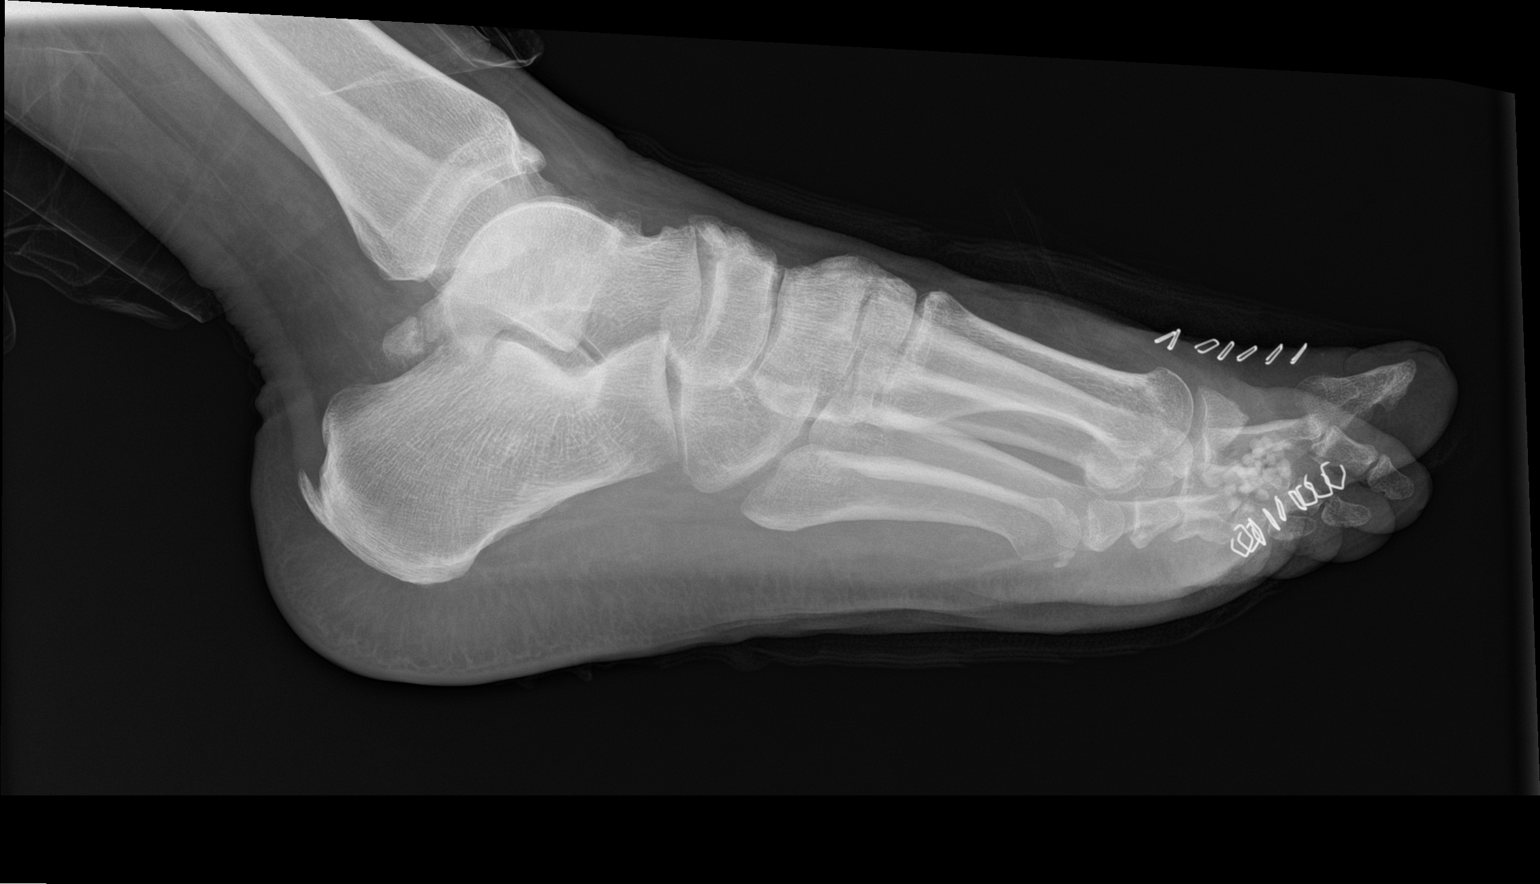

[2 of 2 positions shown; findings below may reference images not displayed]

FINDINGS: Postoperative changes related to exchange of antibiotic beads at the
amputation site at the level of the great toe proximal phalanx
distally. Overlying surgical staples. Diffuse soft tissue swelling.
No definite new acute osseous findings.
IMPRESSION: Postsurgical changes related to exchange of antibiotic beads at the
left great toe proximal phalanx distally.

## 2019-11-21 SURGERY — DEBRIDEMENT, WOUND
Anesthesia: Monitor Anesthesia Care | Site: Foot | Laterality: Left

## 2019-11-21 MED ORDER — BUPIVACAINE HCL (PF) 0.5 % IJ SOLN
INTRAMUSCULAR | Status: DC | PRN
  Administered 2019-11-21: 10 mL

## 2019-11-21 MED ORDER — OXYCODONE HCL 5 MG PO TABS: 5.0000 mg | ORAL_TABLET | Freq: Once | ORAL | Status: DC | PRN

## 2019-11-21 MED ORDER — OXYCODONE HCL 5 MG/5ML PO SOLN: 5.0000 mg | Freq: Once | ORAL | Status: DC | PRN

## 2019-11-21 MED ORDER — PROPOFOL 500 MG/50ML IV EMUL
INTRAVENOUS | Status: DC | PRN
  Administered 2019-11-21: 100 ug/kg/min via INTRAVENOUS

## 2019-11-21 MED ORDER — FENTANYL CITRATE (PF) 100 MCG/2ML IJ SOLN: 25.0000 ug | INTRAMUSCULAR | Status: DC | PRN

## 2019-11-21 MED ORDER — LIDOCAINE 2% (20 MG/ML) 5 ML SYRINGE
INTRAMUSCULAR | Status: DC | PRN
Start: 2019-11-21 — End: 2019-11-21
  Administered 2019-11-21: 100 mg via INTRAVENOUS

## 2019-11-21 MED ORDER — PROMETHAZINE HCL 25 MG/ML IJ SOLN: 6.2500 mg | INTRAMUSCULAR | Status: DC | PRN

## 2019-11-21 MED ORDER — VANCOMYCIN HCL 1000 MG IV SOLR
INTRAVENOUS | Status: DC | PRN
  Administered 2019-11-21: 1000 mg

## 2019-11-21 MED ORDER — MEPERIDINE HCL 50 MG/ML IJ SOLN: 6.2500 mg | INTRAMUSCULAR | Status: DC | PRN

## 2019-11-21 MED ORDER — FENTANYL CITRATE (PF) 100 MCG/2ML IJ SOLN
INTRAMUSCULAR | Status: DC | PRN
  Administered 2019-11-21 (×2): 50 ug via INTRAVENOUS

## 2019-11-21 MED ORDER — SODIUM CHLORIDE 0.9 % IR SOLN
Status: DC | PRN
  Administered 2019-11-21: 3000 mL

## 2019-11-21 MED ORDER — ACETAMINOPHEN 10 MG/ML IV SOLN: 1000.0000 mg | Freq: Once | INTRAVENOUS | Status: DC | PRN

## 2019-11-21 MED ORDER — MIDAZOLAM HCL 5 MG/5ML IJ SOLN
INTRAMUSCULAR | Status: DC | PRN
  Administered 2019-11-21: 2 mg via INTRAVENOUS

## 2019-11-21 MED ORDER — PROPOFOL 10 MG/ML IV BOLUS
INTRAVENOUS | Status: AC
  Filled 2019-11-21: qty 20

## 2019-11-21 MED ORDER — FENTANYL CITRATE (PF) 100 MCG/2ML IJ SOLN
INTRAMUSCULAR | Status: AC
  Filled 2019-11-21: qty 2

## 2019-11-21 MED ORDER — BUPIVACAINE HCL 0.25 % IJ SOLN
INTRAMUSCULAR | Status: AC
  Filled 2019-11-21: qty 1

## 2019-11-21 MED ORDER — VANCOMYCIN HCL 1000 MG IV SOLR
INTRAVENOUS | Status: AC
  Filled 2019-11-21: qty 1000

## 2019-11-21 MED ORDER — OXYCODONE-ACETAMINOPHEN 5-325 MG PO TABS: 1.0000 | ORAL_TABLET | ORAL | 0 refills | Status: DC | PRN

## 2019-11-21 MED ORDER — LACTATED RINGERS IV SOLN: INTRAVENOUS | Status: DC

## 2019-11-21 MED ORDER — PROPOFOL 500 MG/50ML IV EMUL
INTRAVENOUS | Status: AC
  Filled 2019-11-21: qty 50

## 2019-11-21 MED ORDER — MIDAZOLAM HCL 2 MG/2ML IJ SOLN
INTRAMUSCULAR | Status: AC
  Filled 2019-11-21: qty 2

## 2019-11-21 MED ORDER — PROPOFOL 10 MG/ML IV BOLUS
INTRAVENOUS | Status: DC | PRN
Start: 2019-11-21 — End: 2019-11-21
  Administered 2019-11-21: 20 mg via INTRAVENOUS

## 2019-11-21 MED ORDER — ACETAMINOPHEN 160 MG/5ML PO SOLN: 325.0000 mg | Freq: Once | ORAL | Status: DC | PRN

## 2019-11-21 MED ORDER — ACETAMINOPHEN 325 MG PO TABS: 325.0000 mg | ORAL_TABLET | Freq: Once | ORAL | Status: DC | PRN

## 2019-11-21 MED ORDER — BUPIVACAINE HCL (PF) 0.5 % IJ SOLN
INTRAMUSCULAR | Status: AC
  Filled 2019-11-21: qty 30

## 2019-11-21 SURGICAL SUPPLY — 62 items
APL PRP STRL LF DISP 70% ISPRP (MISCELLANEOUS)
BLADE HEX COATED 2.75 (ELECTRODE) ×2 IMPLANT
BLADE OSCILLATING/SAGITTAL (BLADE)
BLADE SURG 15 STRL LF DISP TIS (BLADE) ×1 IMPLANT
BLADE SURG 15 STRL SS (BLADE) ×2
BLADE SW THK.38XMED LNG THN (BLADE) IMPLANT
BNDG CMPR 9X4 STRL LF SNTH (GAUZE/BANDAGES/DRESSINGS) ×1
BNDG CONFORM 3 STRL LF (GAUZE/BANDAGES/DRESSINGS) ×1 IMPLANT
BNDG ELASTIC 3X5.8 VLCR STR LF (GAUZE/BANDAGES/DRESSINGS) ×2 IMPLANT
BNDG ELASTIC 4X5.8 VLCR STR LF (GAUZE/BANDAGES/DRESSINGS) IMPLANT
BNDG ESMARK 4X9 LF (GAUZE/BANDAGES/DRESSINGS) ×2 IMPLANT
BNDG GAUZE ELAST 4 BULKY (GAUZE/BANDAGES/DRESSINGS) ×1 IMPLANT
CHLORAPREP W/TINT 26 (MISCELLANEOUS) ×1 IMPLANT
COVER BACK TABLE 60X90IN (DRAPES) ×2 IMPLANT
COVER WAND RF STERILE (DRAPES) IMPLANT
CUFF TOURN SGL QUICK 18X4 (TOURNIQUET CUFF) ×1 IMPLANT
DRAPE EXTREMITY T 121X128X90 (DISPOSABLE) ×2 IMPLANT
DRAPE IMP U-DRAPE 54X76 (DRAPES) ×2 IMPLANT
DRAPE U-SHAPE 47X51 STRL (DRAPES) ×2 IMPLANT
DRSG EMULSION OIL 3X3 NADH (GAUZE/BANDAGES/DRESSINGS) ×2 IMPLANT
DRSG PAD ABDOMINAL 8X10 ST (GAUZE/BANDAGES/DRESSINGS) IMPLANT
ELECT REM PT RETURN 15FT ADLT (MISCELLANEOUS) ×2 IMPLANT
GAUZE 4X4 16PLY RFD (DISPOSABLE) IMPLANT
GAUZE SPONGE 4X4 12PLY STRL (GAUZE/BANDAGES/DRESSINGS) ×2 IMPLANT
GAUZE XEROFORM 1X8 LF (GAUZE/BANDAGES/DRESSINGS) ×2 IMPLANT
GAUZE XEROFORM 5X9 LF (GAUZE/BANDAGES/DRESSINGS) IMPLANT
GLOVE BIO SURGEON STRL SZ7.5 (GLOVE) ×2 IMPLANT
GLOVE BIOGEL PI IND STRL 8 (GLOVE) ×1 IMPLANT
GLOVE BIOGEL PI INDICATOR 8 (GLOVE) ×1
GOWN STRL REUS W/ TWL LRG LVL3 (GOWN DISPOSABLE) ×1 IMPLANT
GOWN STRL REUS W/TWL LRG LVL3 (GOWN DISPOSABLE) ×2
GOWN STRL REUS W/TWL XL LVL3 (GOWN DISPOSABLE) ×4 IMPLANT
HANDPIECE INTERPULSE COAX TIP (DISPOSABLE) ×2
KIT BASIN OR (CUSTOM PROCEDURE TRAY) ×2 IMPLANT
KIT STIMULAN RAPID CURE  10CC (Orthopedic Implant) ×2 IMPLANT
KIT STIMULAN RAPID CURE 10CC (Orthopedic Implant) ×1 IMPLANT
MANIFOLD NEPTUNE II (INSTRUMENTS) ×2 IMPLANT
NEEDLE HYPO 25X1 1.5 SAFETY (NEEDLE) ×2 IMPLANT
NS IRRIG 1000ML POUR BTL (IV SOLUTION) IMPLANT
PACK ORTHO EXTREMITY (CUSTOM PROCEDURE TRAY) ×2 IMPLANT
PADDING CAST ABS 4INX4YD NS (CAST SUPPLIES) ×1
PADDING CAST ABS COTTON 4X4 ST (CAST SUPPLIES) ×1 IMPLANT
PENCIL SMOKE EVACUATOR (MISCELLANEOUS) IMPLANT
SET HNDPC FAN SPRY TIP SCT (DISPOSABLE) ×1 IMPLANT
SET IRRIG Y TYPE TUR BLADDER L (SET/KITS/TRAYS/PACK) IMPLANT
SLEEVE SCD COMPRESS KNEE MED (MISCELLANEOUS) ×2 IMPLANT
SPONGE SURGIFOAM ABS GEL 100 (HEMOSTASIS) IMPLANT
STAPLER VISISTAT 35W (STAPLE) ×2 IMPLANT
STOCKINETTE 8 INCH (MISCELLANEOUS) ×4 IMPLANT
SUT ETHILON 3 0 PS 1 (SUTURE) ×1 IMPLANT
SUT ETHILON 4 0 PS 2 18 (SUTURE) IMPLANT
SUT MNCRL AB 3-0 PS2 18 (SUTURE) IMPLANT
SUT MNCRL AB 4-0 PS2 18 (SUTURE) IMPLANT
SUT MON AB 5-0 PS2 18 (SUTURE) IMPLANT
SUT VIC AB 2-0 PS2 27 (SUTURE) ×2 IMPLANT
SUT VIC AB 3-0 FS2 27 (SUTURE) ×3 IMPLANT
SUT VICRYL 4-0 PS2 18IN ABS (SUTURE) ×2 IMPLANT
SYR BULB EAR ULCER 3OZ GRN STR (SYRINGE) ×2 IMPLANT
SYR CONTROL 10ML LL (SYRINGE) ×2 IMPLANT
TOWEL OR 17X26 10 PK STRL BLUE (TOWEL DISPOSABLE) ×2 IMPLANT
UNDERPAD 30X36 HEAVY ABSORB (UNDERPADS AND DIAPERS) ×2 IMPLANT
YANKAUER SUCT BULB TIP NO VENT (SUCTIONS) ×2 IMPLANT

## 2019-11-21 NOTE — Anesthesia Postprocedure Evaluation (Signed)
Anesthesia Post Note  Patient: Mark Stein  Procedure(s) Performed: LEFT FOOT  DEBRIDEMENT WOUND WITH  CLOSURE AND REMOVAL REINSERTION OF NON BIODEGRADABLE DRUG DELIVERY IMPLANT (Left Foot)     Patient location during evaluation: PACU Anesthesia Type: MAC Level of consciousness: awake and alert Pain management: pain level controlled Vital Signs Assessment: post-procedure vital signs reviewed and stable Respiratory status: spontaneous breathing, nonlabored ventilation, respiratory function stable and patient connected to nasal cannula oxygen Cardiovascular status: stable and blood pressure returned to baseline Postop Assessment: no apparent nausea or vomiting Anesthetic complications: no   No complications documented.  Last Vitals:  Vitals:   11/21/19 1445 11/21/19 1500  BP: (!) 127/95 (!) 132/91  Pulse: 75 65  Resp: 16 (!) 11  Temp:  36.5 C  SpO2: 100% 100%    Last Pain:  Vitals:   11/21/19 1500  TempSrc:   PainSc: 0-No pain                 Effie Berkshire

## 2019-11-21 NOTE — Interval H&P Note (Signed)
History and Physical Interval Note:  11/21/2019 1:32 PM  Mark Stein  has presented today for surgery, with the diagnosis of LEFT FOOT WOUND.  The various methods of treatment have been discussed with the patient and family. After consideration of risks, benefits and other options for treatment, the patient has consented to  Procedure(s): LEFT FOOT  DEBRIDEMENT WOUND WITH  CLOSURE AND REMOVAL REINSERTION OF NON BIODEGRADABLE DRUG DELIVERY IMPLANT (Left) as a surgical intervention.  The patient's history has been reviewed, patient examined, no change in status, stable for surgery.  I have reviewed the patient's chart and labs.  Questions were answered to the patient's satisfaction.    Patient's H&P from his hospital admission to serve as his H&P for surgery.  Park Liter

## 2019-11-21 NOTE — Brief Op Note (Signed)
11/21/2019  2:36 PM  PATIENT:  Mark Stein  34 y.o. male  PRE-OPERATIVE DIAGNOSIS:  LEFT FOOT WOUND  POST-OPERATIVE DIAGNOSIS:  LEFT FOOT WOUND  PROCEDURE:  Procedure(s): LEFT FOOT  DEBRIDEMENT WOUND WITH  CLOSURE AND REMOVAL REINSERTION OF NON BIODEGRADABLE DRUG DELIVERY IMPLANT (Left)  SURGEON:  Surgeon(s) and Role:    * Evelina Bucy, DPM - Primary  PHYSICIAN ASSISTANT:   ASSISTANTS: none   ANESTHESIA:   local and MAC  EBL:  15 mL   BLOOD ADMINISTERED:none  DRAINS: none   LOCAL MEDICATIONS USED:  MARCAINE    and Amount: 10 ml  SPECIMEN:  none  DISPOSITION OF SPECIMEN:  N/a  COUNTS:  YES  TOURNIQUET:   Total Tourniquet Time Documented: area (laterality) - 27 minutes Total: area (laterality) - 27 minutes   DICTATION: .Dragon Dictation  PLAN OF CARE: Discharge to home after PACU  PATIENT DISPOSITION:  PACU - hemodynamically stable.   Delay start of Pharmacological VTE agent (>24hrs) due to surgical blood loss or risk of bleeding: not applicable

## 2019-11-21 NOTE — Discharge Instructions (Signed)
  After Surgery Instructions   1) If you are recuperating from surgery anywhere other than home, please be sure to leave [place] the number where you can be reached.  2) Go directly home and rest.  3) Keep the operated foot(feet) elevated six inches above the hip when sitting or lying down. This will help control swelling and pain.  4) Support the elevated foot and leg with pillows. DO NOT PLACE PILLOWS UNDER THE KNEE.  5) DO NOT REMOVE or get your bandages WET, unless you were given different instructions by your doctor to do so. This increases the risk of infection.  6) Wear your surgical shoe or surgical boot at all times when you are up on your feet.  7) A limited amount of pain and swelling may occur. The skin may take on a bruised appearance. DO NOT BE ALARMED, THIS IS NORMAL.  8) For slight pain and swelling, apply an ice pack directly over the bandages for 15 minutes only out of each hour of the day. Continue until seen in the office for your first post op visit. DO NOT APPLY ANY FORM OF HEAT TO THE AREA.  9) Have prescriptions filled immediately and take as directed.  10) Drink lots of liquids, water and juice to stay hydrated.  11) CALL IMMEDIATELY IF:  *Bleeding continues until the following day of surgery  *Pain increases and/or does not respond to medication  *Bandages or cast appears to tight  *If your bandage gets wet  *Trip, fall or stump your surgical foot  *If your temperature goes above 101  *If you have ANY questions at all  12) You are expected to be Heel-weightbearing after your surgery.   If you need to reach the nurse for any reason, please call: /[place]: [phone] [place]: [phone] [place]: [phone]

## 2019-11-21 NOTE — Interval H&P Note (Signed)
Anesthesia H&P Update: History and Physical Exam reviewed; patient is OK for planned anesthetic and procedure. ? ?

## 2019-11-21 NOTE — Op Note (Addendum)
  Patient Name: Mark Stein DOB: 1986/04/13  MRN: [id number]   Date of Surgery: 11/21/2019  Surgeon: Dr. Hardie Pulley, DPM Assistants: None  Pre-operative Diagnosis:  Ulcer left great toe necrosis of bone, osteomyelitis left foot Post-operative Diagnosis:  Same Procedures:  1) Wound Debridement and Delayed Closure of Wound Left Foot  2) Antibiotic Spacer exchange Pathology/Specimens: * No specimens in log * Anesthesia: MAC/local Hemostasis:  Total Tourniquet Time Documented: area (laterality) - 27 minutes Total: area (laterality) - 27 minutes  Estimated Blood Loss: 15 mL Materials:  Implant Name Type Inv. Item Serial No. Manufacturer Lot No. LRB No. Used Action  BONE CEMENT GENTAMICIN - [id number] Cement BONE CEMENT GENTAMICIN  DEPUY [employer]ES [id number] Left 1 Explanted  KIT STIMULAN RAPID CURE  10CC - [id number] Orthopedic Implant KIT STIMULAN RAPID CURE  10CC  [employer] [id number] Left 1 Implanted   Medications: 1g Vancomycin (in Stimulan beads) Complications: none  Indications for Procedure:  This is a 34 y.o. male with a chronic left hallux ulceration. He presents today for planned antibiotic spacer exchange with    Procedure in Detail: Patient was identified in pre-operative holding area. Formal consent was signed and the left lower extremity was marked. Patient was brought back to the operating room. Anesthesia was induced. The extremity was prepped and draped in the usual sterile fashion. Timeout was taken to confirm patient name, laterality, and procedure prior to incision.   Attention was then directed to the left foot. There was a wound at the plantar foot measuring 2x2cmx1.5cm. The sutures were excised. The peripheral aspect of the wound was debrided with a 15 blade.  There was three antibiotic cement beads which were retrieved. The prolene string was intact.  The wound was copiously irrigated with 3L of NS via pulse lavage. The wound was then excisionally  debrided with a rongeur down to but not including the bone. Non-viable soft tissue was removed. Stimulan beads were fashioned with vancomycin and were packed into the deficit. The wound was then closed in layers with 2-0 vicryl, 3-0 vicryl, 3-0 nylon mattress suture and skin staples.  The foot was then dressed with xeroform, 4x4, kerlix, and ACE bandage. Patient tolerated the procedure well.   Disposition: Following a period of post-operative monitoring, patient will be transferred home.

## 2019-11-21 NOTE — Anesthesia Preprocedure Evaluation (Addendum)
Anesthesia Evaluation  Patient identified by MRN, date of birth, ID band Patient awake    Reviewed: Allergy & Precautions, NPO status , Patient's Chart, lab work & pertinent test results  Airway Mallampati: I  TM Distance: >3 FB Neck ROM: Full    Dental  (+) Teeth Intact, Dental Advisory Given   Pulmonary former smoker,    breath sounds clear to auscultation       Cardiovascular negative cardio ROS   Rhythm:Regular Rate:Normal     Neuro/Psych    GI/Hepatic   Endo/Other    Renal/GU      Musculoskeletal   Abdominal Normal abdominal exam  (+)   Peds  Hematology   Anesthesia Other Findings   Reproductive/Obstetrics                            Anesthesia Physical Anesthesia Plan  ASA: II  Anesthesia Plan: MAC   Post-op Pain Management:    Induction: Intravenous  PONV Risk Score and Plan: 1 and Propofol infusion and Ondansetron  Airway Management Planned: Natural Airway and Simple Face Mask  Additional Equipment: None  Intra-op Plan:   Post-operative Plan:   Informed Consent: I have reviewed the patients History and Physical, chart, labs and discussed the procedure including the risks, benefits and alternatives for the proposed anesthesia with the patient or authorized representative who has indicated his/her understanding and acceptance.       Plan Discussed with: CRNA  Anesthesia Plan Comments:         Anesthesia Quick Evaluation

## 2019-11-21 NOTE — Transfer of Care (Signed)
Immediate Anesthesia Transfer of Care Note  Patient: Mark Stein  Procedure(s) Performed: LEFT FOOT  DEBRIDEMENT WOUND WITH  CLOSURE AND REMOVAL REINSERTION OF NON BIODEGRADABLE DRUG DELIVERY IMPLANT (Left Foot)  Patient Location: PACU  Anesthesia Type:General  Level of Consciousness: awake, alert  and oriented  Airway & Oxygen Therapy: Patient Spontanous Breathing and Patient connected to face mask oxygen  Post-op Assessment: Report given to RN and Post -op Vital signs reviewed and stable  Post vital signs: Reviewed and stable  Last Vitals:  Vitals Value Taken Time  BP 112/65 11/21/19 1432  Temp    Pulse 69 11/21/19 1434  Resp 15 11/21/19 1434  SpO2 100 % 11/21/19 1434  Vitals shown include unvalidated device data.  Last Pain:  Vitals:   11/21/19 1132  TempSrc: Oral      Patients Stated Pain Goal: 2 (11/21/19 1146)  Complications: No complications documented.

## 2019-11-24 ENCOUNTER — Encounter (HOSPITAL_COMMUNITY): Payer: Self-pay | Admitting: Podiatry

## 2019-11-24 ENCOUNTER — Ambulatory Visit
Admission: RE | Admit: 2019-11-24 | Discharge: 2019-11-24 | Disposition: A | Discharge status: Home / Self Care | Payer: BC Managed Care – PPO | Source: Ambulatory Visit | Attending: Neurology | Admitting: Neurology

## 2019-11-24 ENCOUNTER — Other Ambulatory Visit: Payer: Self-pay

## 2019-11-24 DIAGNOSIS — M48061 Spinal stenosis, lumbar region without neurogenic claudication: Secondary | ICD-10-CM | POA: Diagnosis not present

## 2019-11-24 DIAGNOSIS — M4804 Spinal stenosis, thoracic region: Secondary | ICD-10-CM | POA: Diagnosis not present

## 2019-11-24 DIAGNOSIS — M5126 Other intervertebral disc displacement, lumbar region: Secondary | ICD-10-CM | POA: Diagnosis not present

## 2019-11-24 DIAGNOSIS — G9520 Unspecified cord compression: Secondary | ICD-10-CM | POA: Diagnosis not present

## 2019-11-24 DIAGNOSIS — M21371 Foot drop, right foot: Secondary | ICD-10-CM

## 2019-11-24 IMAGING — MR MR LUMBAR SPINE WO/W CM
4 of 7 series · 18 of 48 positions shown · IV contrast (20 ml multihance)
Comparison: None.

CLINICAL DATA: Bilateral foot drop low back pain.

EXAM:
MRI LUMBAR SPINE WITHOUT AND WITH CONTRAST
TECHNIQUE: Multiplanar and multiecho pulse sequences of the lumbar spine were
obtained without and with intravenous contrast.
CONTRAST:  20mL MULTIHANCE GADOBENATE DIMEGLUMINE 529 MG/ML IV SOLN

[Series 6: T1 · sagittal · 4.0mm · 0.73mm/px · 3 of 16 slices shown (1 of 2)]
[im 1/16]
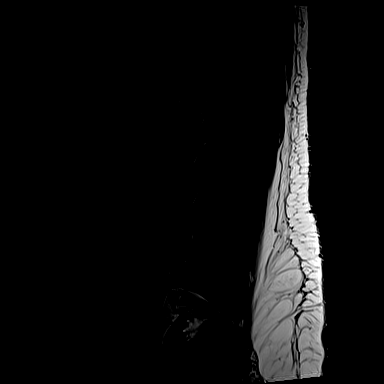
[im 8/16]
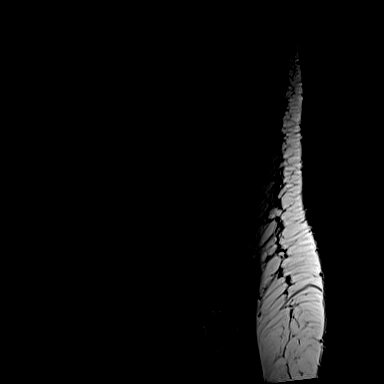
[im 16/16]
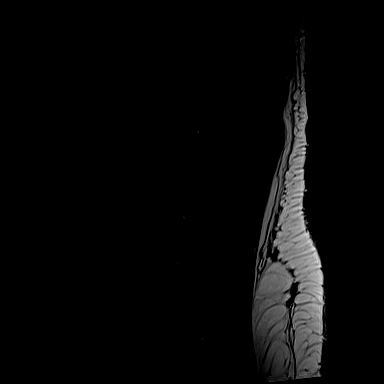

[Series 10: T1 · axial · 4.0mm · 0.28mm/px · z∈[-97,+81]mm · 3 of 43 slices shown (2 of 2)]
[im 5/43]
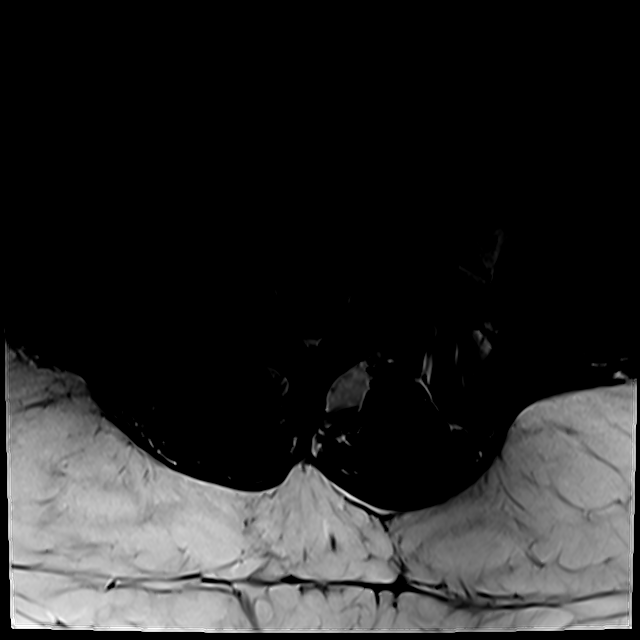
[im 22/43]
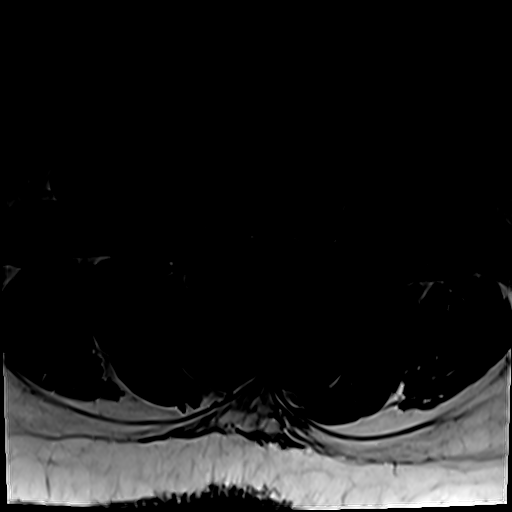
[im 38/43]
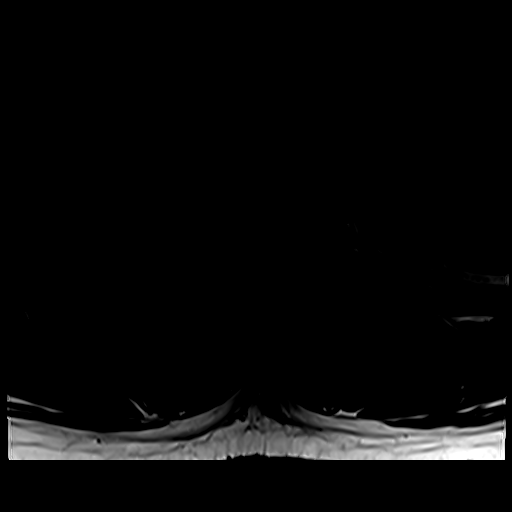

[Series 13: T2 · axial · 4.0mm · 0.28mm/px · z∈[-117,+81]mm · 9 of 43 slices shown (1 of 2)]
[im 1/43]
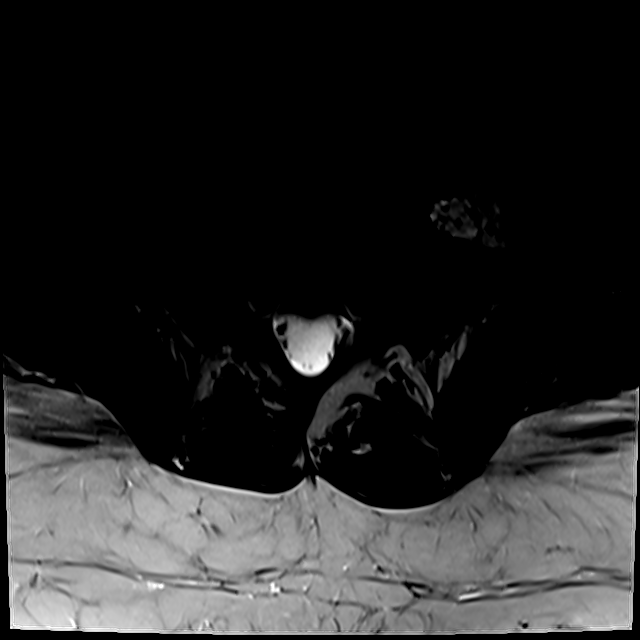
[im 5/43]
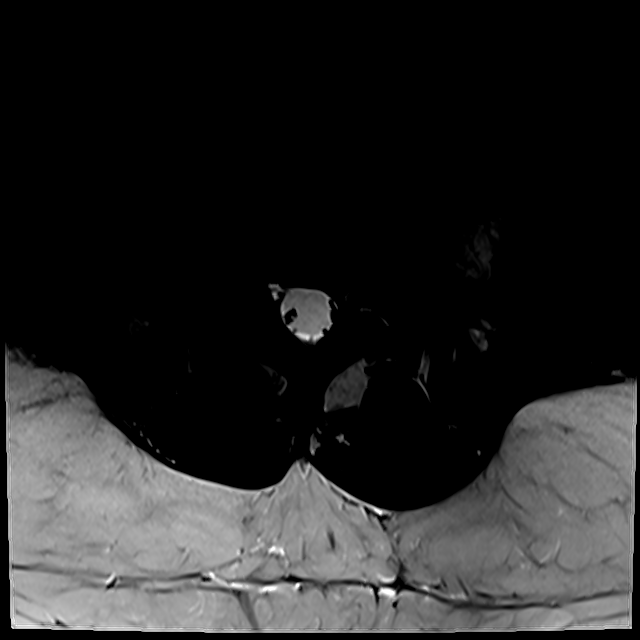
[im 9/43]
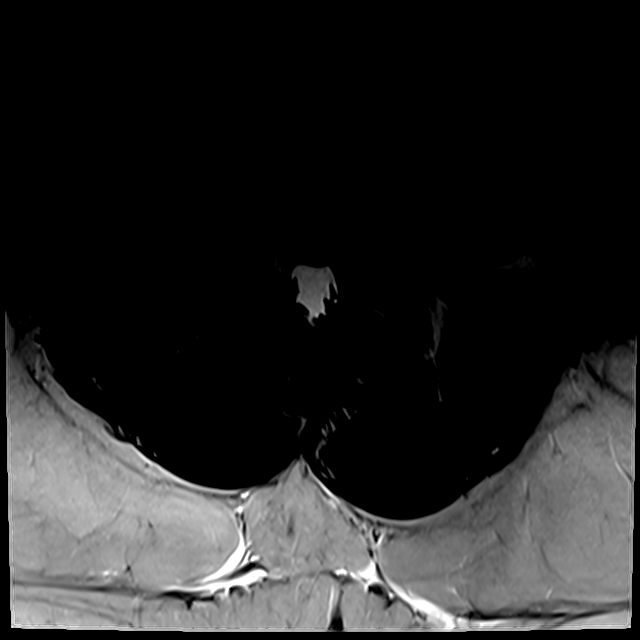
[im 13/43]
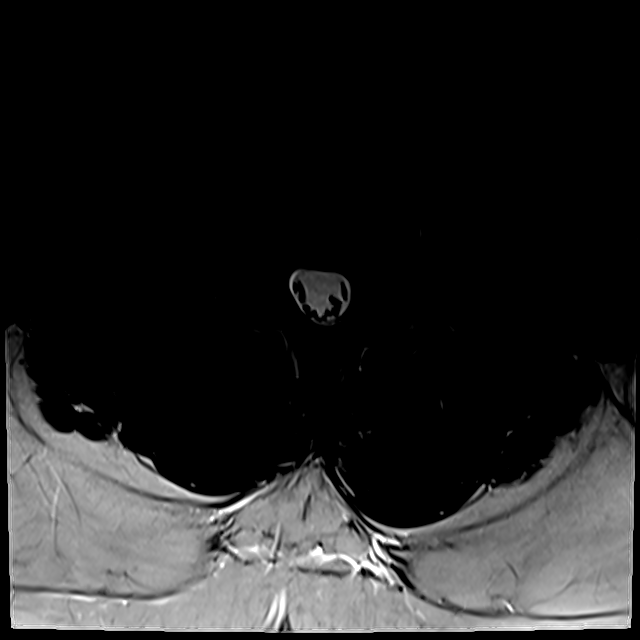
[im 17/43]
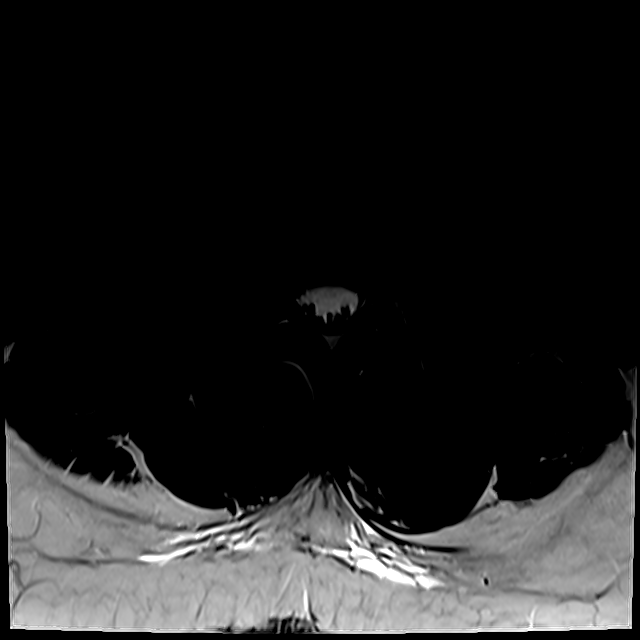
[im 22/43]
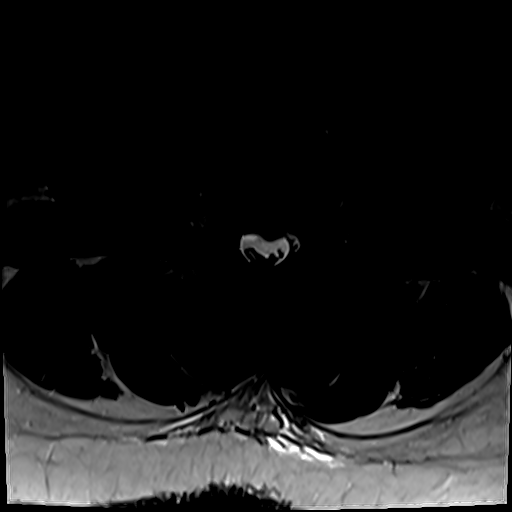
[im 26/43]
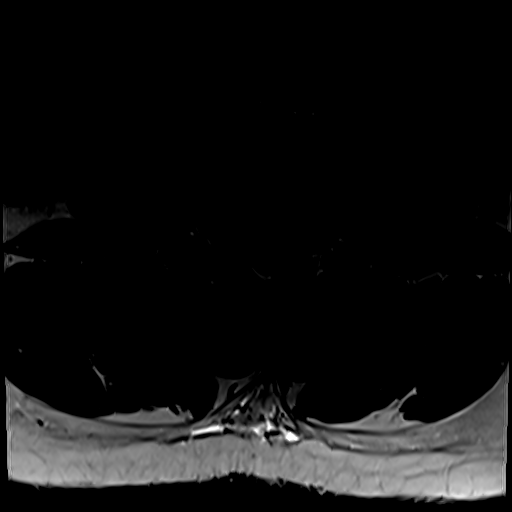
[im 30/43]
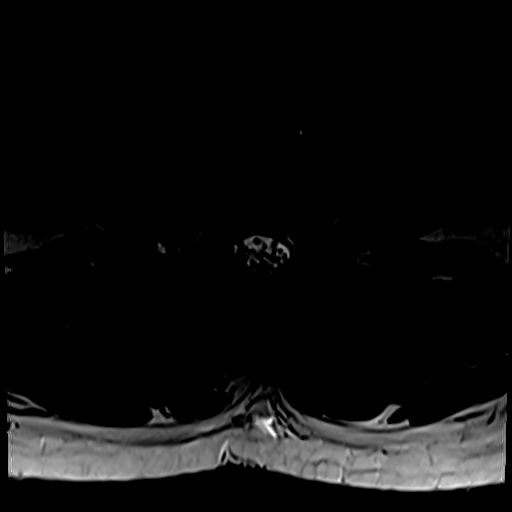
[im 38/43]
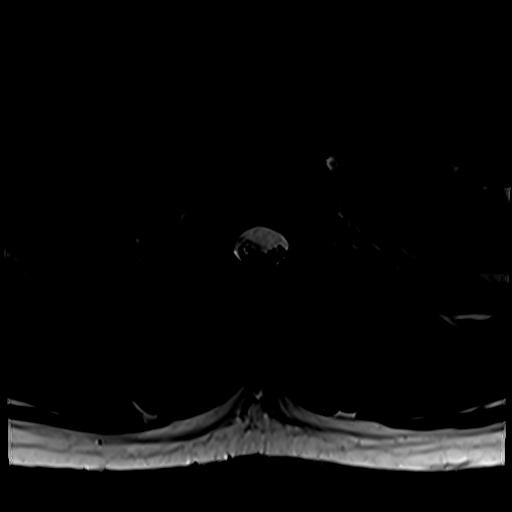

[Series 18: T2 · sagittal · 4.0mm · 0.73mm/px · 3 of 16 slices shown (2 of 2)]
[im 1/16]
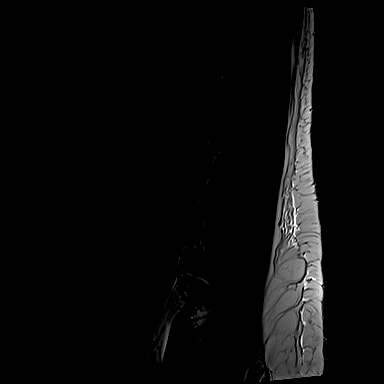
[im 11/16]
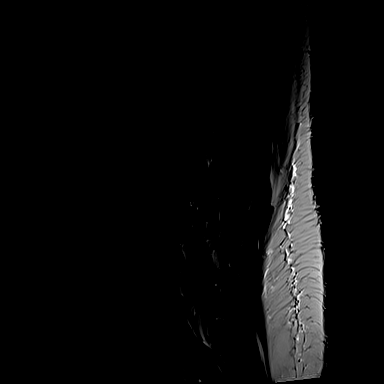
[im 16/16]
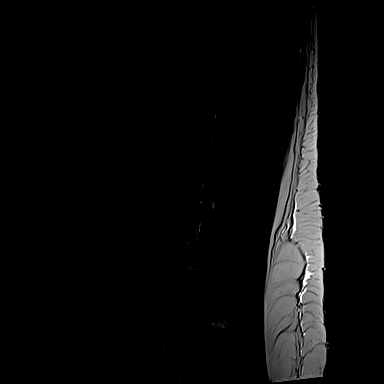

[18 of 48 positions shown; findings below may reference images not displayed]

FINDINGS: Segmentation:  Normal

Alignment:  Normal

Vertebrae:  Negative for fracture or mass.  Normal bone marrow.

Conus medullaris and cauda equina: Conus extends to the T12-L1
level. Entrapment of cauda carina above the L2-3 level due to severe
spinal stenosis at L2-3.

Paraspinal and other soft tissues: Negative for paraspinous mass or
adenopathy.

Disc levels:

T11-12: Sagittal images only. Moderate to large central disc
protrusion with spurring. This is causing at least moderate
stenosis. Possible cord signal abnormality in the distal spinal
cord, incompletely evaluated.

T12-L1: Negative

L1-2: Negative

L2-3: Large central disc protrusion with associated large
osteophytes causing severe spinal stenosis. Mild facet degeneration.
Moderate to severe subarticular stenosis bilaterally.

L3-4: Disc degeneration with diffuse disc bulging. Right foraminal
disc protrusion with impingement of the right L4 nerve root. Spinal
canal adequate in size.

L4-5: Disc degeneration with diffuse disc bulging. Small left
foraminal disc protrusion with displacement of the left L4 nerve
root. Bilateral mild facet degeneration. Moderate subarticular
stenosis on the left

L5-S1: Bilateral pars defects of L5. Central and left-sided disc
protrusion causing subarticular stenosis on the left. There is
marked left foraminal encroachment impingement of the left L5 nerve
root. Mild impingement left S1 nerve root. Moderate to severe right
foraminal encroachment.
IMPRESSION: Moderate to large central disc protrusion T11-12 with spurring and
moderate to severe spinal stenosis. Possible cord signal
abnormality. Recommend MRI thoracic spine for further evaluation of
this level.

Large central disc protrusion and spurring L2-3 with severe spinal
stenosis and moderate to severe subarticular stenosis bilaterally

Right foraminal disc protrusion L3-4 with right L4 nerve root
impingement.

Small left foraminal disc protrusion L4-5 with displacement left L4
nerve root.

Bilateral pars defects of L5. There is severe left foraminal
encroachment and compression of the left L5 nerve root. Moderate to
severe right foraminal encroachment.

## 2019-11-24 MED ORDER — GADOBENATE DIMEGLUMINE 529 MG/ML IV SOLN
20.0000 mL | Freq: Once | INTRAVENOUS | Status: AC | PRN
  Administered 2019-11-24: 20 mL via INTRAVENOUS

## 2019-11-25 DIAGNOSIS — L02619 Cutaneous abscess of unspecified foot: Secondary | ICD-10-CM | POA: Diagnosis not present

## 2019-11-25 DIAGNOSIS — L0390 Cellulitis, unspecified: Secondary | ICD-10-CM | POA: Diagnosis not present

## 2019-11-25 DIAGNOSIS — M869 Osteomyelitis, unspecified: Secondary | ICD-10-CM | POA: Diagnosis not present

## 2019-11-25 DIAGNOSIS — L97524 Non-pressure chronic ulcer of other part of left foot with necrosis of bone: Secondary | ICD-10-CM | POA: Diagnosis not present

## 2019-11-25 DIAGNOSIS — L98491 Non-pressure chronic ulcer of skin of other sites limited to breakdown of skin: Secondary | ICD-10-CM | POA: Diagnosis not present

## 2019-11-26 ENCOUNTER — Other Ambulatory Visit: Payer: Self-pay

## 2019-11-26 DIAGNOSIS — M21371 Foot drop, right foot: Secondary | ICD-10-CM

## 2019-11-26 DIAGNOSIS — M21372 Foot drop, left foot: Secondary | ICD-10-CM

## 2019-11-26 DIAGNOSIS — R9389 Abnormal findings on diagnostic imaging of other specified body structures: Secondary | ICD-10-CM

## 2019-11-27 ENCOUNTER — Telehealth: Payer: Self-pay

## 2019-11-27 NOTE — Telephone Encounter (Signed)
Pt advised to keep EMG appt and we will restart PA process for MRI afterwards  if we need to. Pt verbalized understanding.

## 2019-11-27 NOTE — Telephone Encounter (Signed)
Start with EMG first as scheduled, Dr. Everlena Cooper can decide after EMG about next steps. Thanks

## 2019-11-27 NOTE — Telephone Encounter (Signed)
Advised pt his Pa for the MRI is pending,  Dr. Everlena Cooper or Dr. Lahoma Rocker, Per insurance we have to do a Peer to Peer, We want to retry before we go that route. Please advise

## 2019-11-28 ENCOUNTER — Other Ambulatory Visit: Payer: Self-pay

## 2019-11-28 ENCOUNTER — Ambulatory Visit (INDEPENDENT_AMBULATORY_CARE_PROVIDER_SITE_OTHER): Payer: BC Managed Care – PPO | Admitting: Podiatry

## 2019-11-28 DIAGNOSIS — Z9889 Other specified postprocedural states: Secondary | ICD-10-CM

## 2019-11-28 DIAGNOSIS — M869 Osteomyelitis, unspecified: Secondary | ICD-10-CM | POA: Diagnosis not present

## 2019-11-28 DIAGNOSIS — L02619 Cutaneous abscess of unspecified foot: Secondary | ICD-10-CM | POA: Diagnosis not present

## 2019-11-28 DIAGNOSIS — M86171 Other acute osteomyelitis, right ankle and foot: Secondary | ICD-10-CM

## 2019-11-28 DIAGNOSIS — L0390 Cellulitis, unspecified: Secondary | ICD-10-CM | POA: Diagnosis not present

## 2019-11-28 DIAGNOSIS — L97524 Non-pressure chronic ulcer of other part of left foot with necrosis of bone: Secondary | ICD-10-CM | POA: Diagnosis not present

## 2019-11-28 DIAGNOSIS — L97522 Non-pressure chronic ulcer of other part of left foot with fat layer exposed: Secondary | ICD-10-CM

## 2019-11-29 NOTE — Progress Notes (Signed)
  Subjective:  Patient ID: Mark Stein, male    DOB: 11/01/1985,  MRN: [id number]    DOS: 11/21/19 Procedure: 1) Wound Debridement and Delayed Closure of Wound Left Foot             2) Antibiotic Spacer exchange  34 y.o. male returns for post-op check.   Review of Systems: Negative except as noted in the HPI. Denies N/V/F/Ch.   Objective:  There were no vitals filed for this visit. There is no height or weight on file to calculate BMI. Constitutional Well developed. Well nourished.  Vascular Foot warm and well perfused. Capillary refill normal to all digits.   Neurologic Normal speech. Oriented to person, place, and time.   Dermatologic Skin healing well without signs of infection. Skin edges well coapted without signs of infection.  Orthopedic: No tenderness to palpation noted about the surgical site.    Assessment:   1. Skin ulcer of left foot with fat layer exposed (HCC)   2. Acute osteomyelitis of toe, right (HCC)   3. Post-operative state    Plan:  Patient was evaluated and treated and all questions answered.  S/p foot surgery left -Progressing as expected post-operatively. -XR: c/w postoperative changes -WB Status: NWB  -Sutures: and staples to remain. -Medications: continue current medications -Foot redressed.  Return in about 1 week (around 12/05/2019) for wound re-check.

## 2019-12-02 ENCOUNTER — Other Ambulatory Visit: Payer: Self-pay

## 2019-12-02 ENCOUNTER — Ambulatory Visit (HOSPITAL_COMMUNITY)
Admission: RE | Admit: 2019-12-02 | Discharge: 2019-12-02 | Disposition: A | Discharge status: Home / Self Care | Payer: BC Managed Care – PPO | Source: Ambulatory Visit | Attending: Neurology | Admitting: Neurology

## 2019-12-02 ENCOUNTER — Telehealth: Payer: Self-pay | Admitting: Neurology

## 2019-12-02 DIAGNOSIS — M5124 Other intervertebral disc displacement, thoracic region: Secondary | ICD-10-CM | POA: Diagnosis not present

## 2019-12-02 DIAGNOSIS — M869 Osteomyelitis, unspecified: Secondary | ICD-10-CM | POA: Diagnosis not present

## 2019-12-02 DIAGNOSIS — G9519 Other vascular myelopathies: Secondary | ICD-10-CM | POA: Diagnosis not present

## 2019-12-02 DIAGNOSIS — R9389 Abnormal findings on diagnostic imaging of other specified body structures: Secondary | ICD-10-CM | POA: Insufficient documentation

## 2019-12-02 DIAGNOSIS — G9520 Unspecified cord compression: Secondary | ICD-10-CM | POA: Diagnosis not present

## 2019-12-02 DIAGNOSIS — L0390 Cellulitis, unspecified: Secondary | ICD-10-CM | POA: Diagnosis not present

## 2019-12-02 DIAGNOSIS — L97524 Non-pressure chronic ulcer of other part of left foot with necrosis of bone: Secondary | ICD-10-CM | POA: Diagnosis not present

## 2019-12-02 DIAGNOSIS — L98491 Non-pressure chronic ulcer of skin of other sites limited to breakdown of skin: Secondary | ICD-10-CM | POA: Diagnosis not present

## 2019-12-02 DIAGNOSIS — G9589 Other specified diseases of spinal cord: Secondary | ICD-10-CM | POA: Diagnosis not present

## 2019-12-02 DIAGNOSIS — L02619 Cutaneous abscess of unspecified foot: Secondary | ICD-10-CM | POA: Diagnosis not present

## 2019-12-02 IMAGING — MR MR THORACIC SPINE W/O CM
7 series · 38 of 48 positions shown · non-contrast
Comparison: Lumbar spine MRI [DATE].

CLINICAL DATA: Low back pain and bilateral foot drop. Prior lumbar
spine MRI demonstrated degenerative disc disease at T11-12 which was
incompletely evaluated

EXAM:
MRI THORACIC SPINE WITHOUT CONTRAST
TECHNIQUE: Multiplanar, multisequence MR imaging of the thoracic spine was
performed. No intravenous contrast was administered.

[Series 16: T1 · sagittal · 4.0mm · 1.72mm/px · 1 of 5 slices shown (1 of 2)]
[im 1/5]
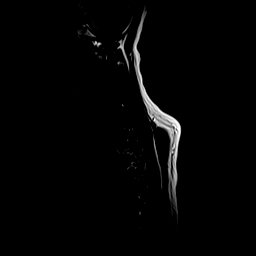

[Series 17: STIR · sagittal · 3.0mm · 1.09mm/px · 5 of 15 slices shown]
[im 1/15]
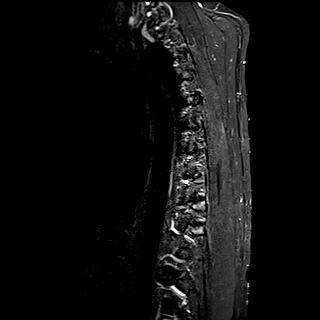
[im 4/15]
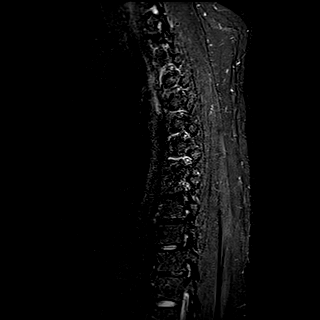
[im 8/15]
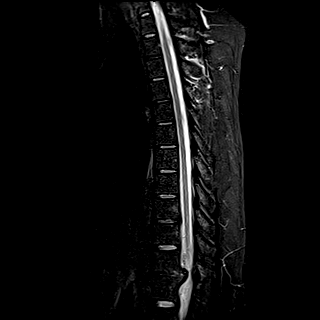
[im 11/15]
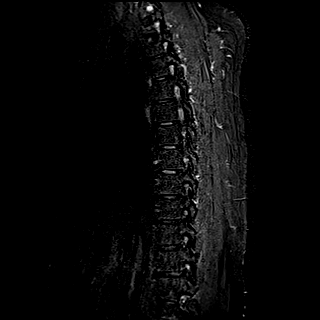
[im 15/15]
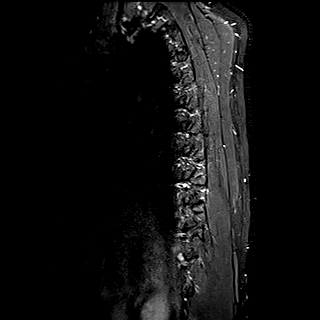

[Series 18: T1 · sagittal · 3.0mm · 1.09mm/px · 5 of 15 slices shown (2 of 2)]
[im 1/15]
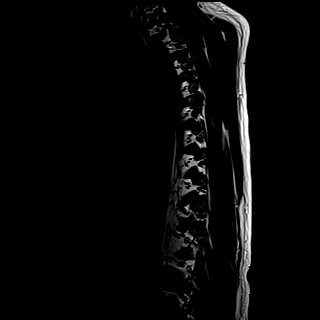
[im 4/15]
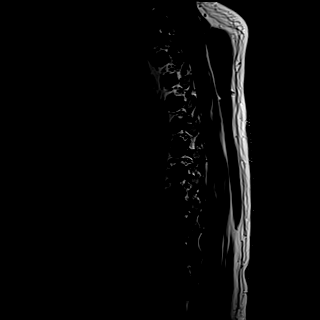
[im 8/15]
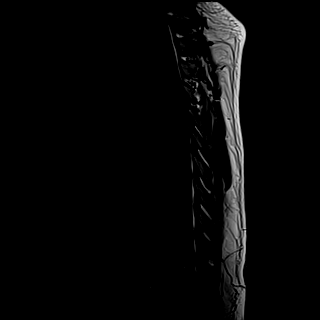
[im 11/15]
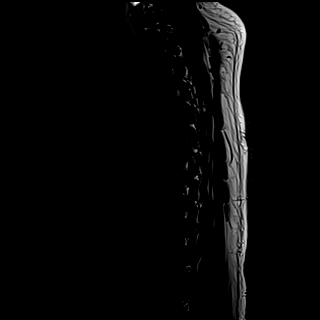
[im 15/15]
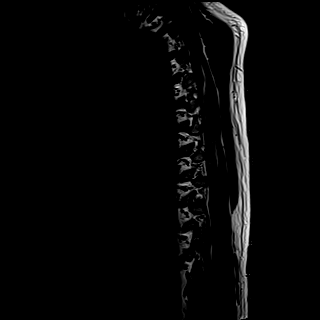

[Series 19: T2 · sagittal · 3.0mm · 1.09mm/px · 5 of 15 slices shown (1 of 3)]
[im 1/15]
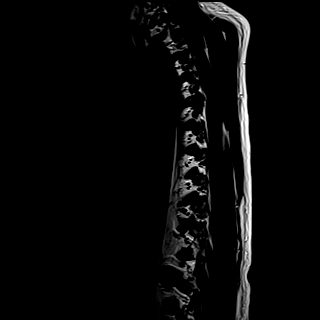
[im 4/15]
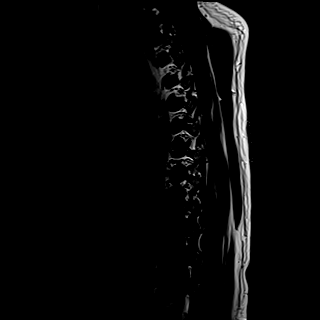
[im 8/15]
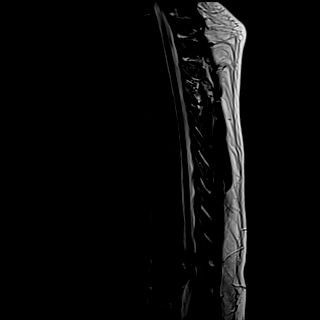
[im 11/15]
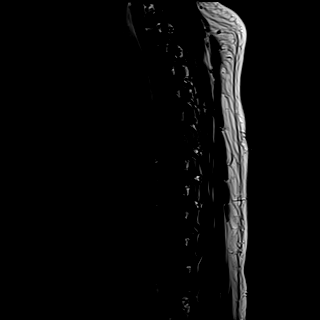
[im 15/15]
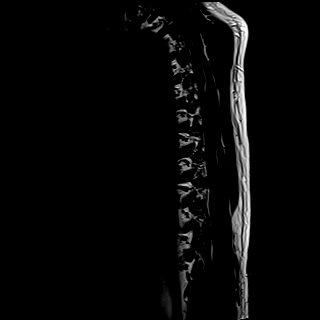

[Series 20: T2 · axial · 4.0mm · 0.78mm/px · z∈[-318,-19]mm · 10 of 46 slices shown (2 of 3)]
[im 1/46]
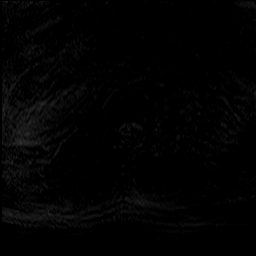
[im 4/46]
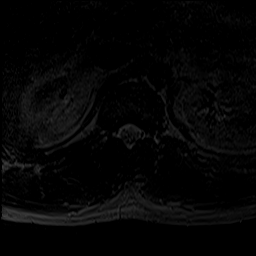
[im 7/46]
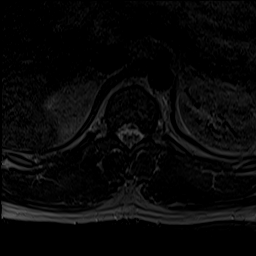
[im 11/46]
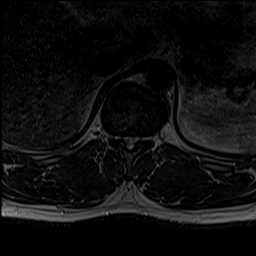
[im 14/46]
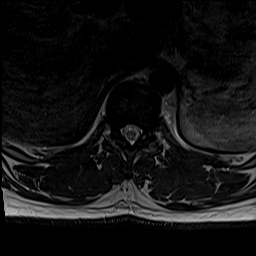
[im 21/46]
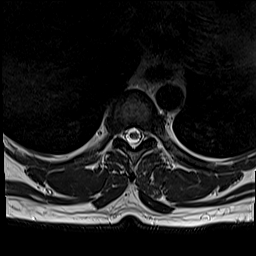
[im 25/46]
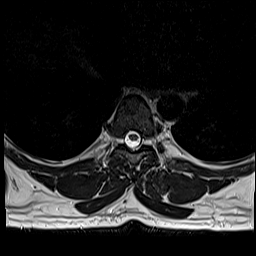
[im 32/46]
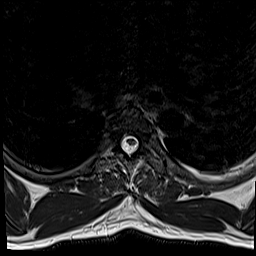
[im 39/46]
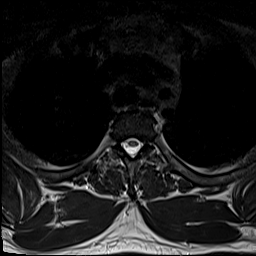
[im 46/46]
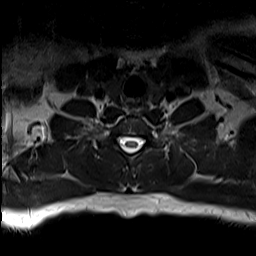

[Series 21: t2_me2d_tra · axial · 4.0mm · 0.39mm/px · z∈[-318,-19]mm · 8 of 46 slices shown]
[im 1/46]
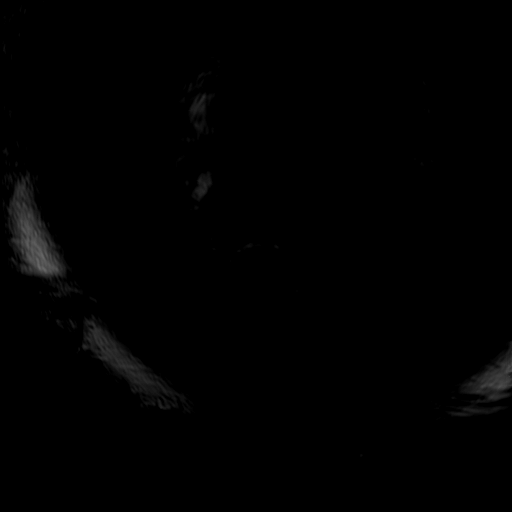
[im 7/46]
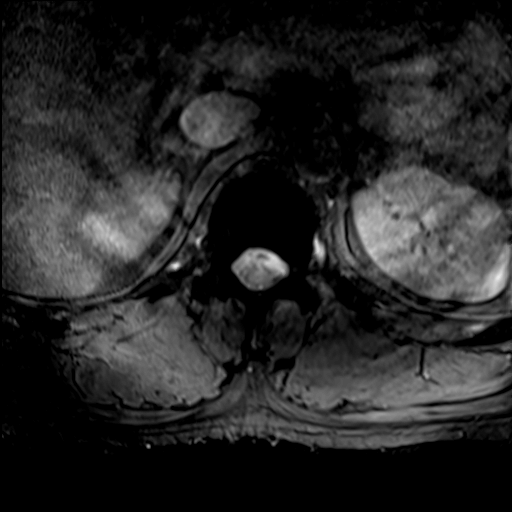
[im 14/46]
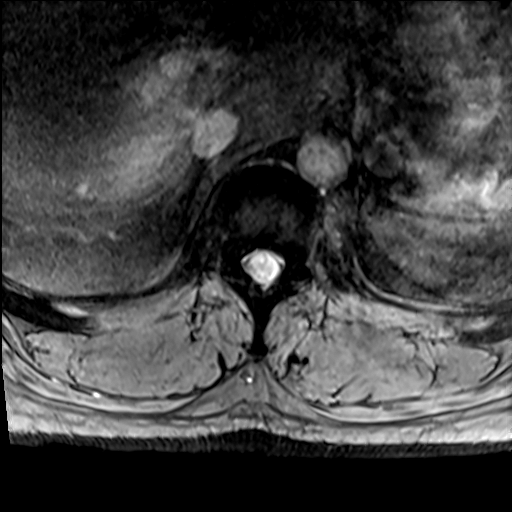
[im 21/46]
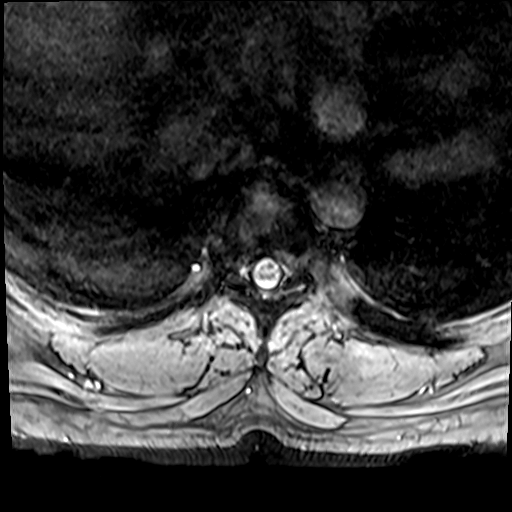
[im 25/46]
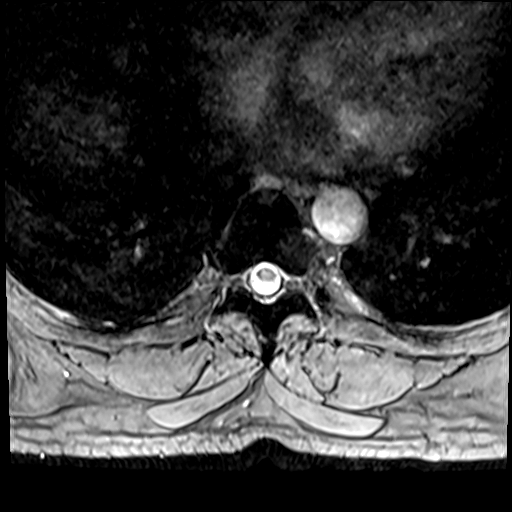
[im 32/46]
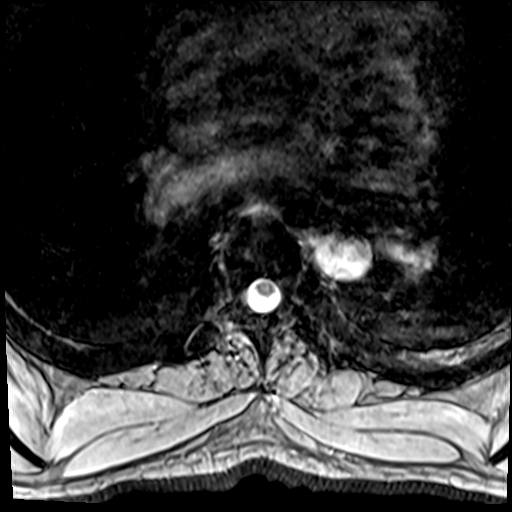
[im 39/46]
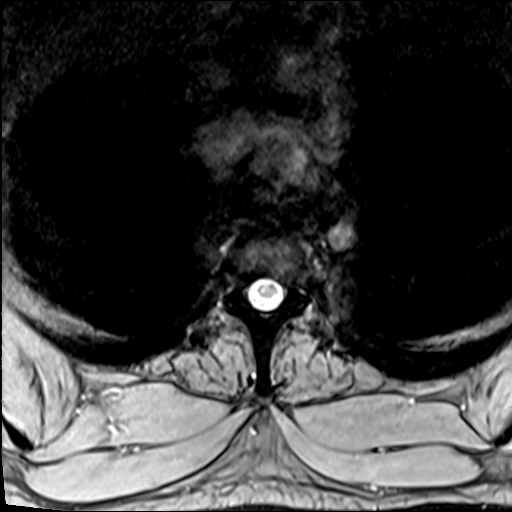
[im 46/46]
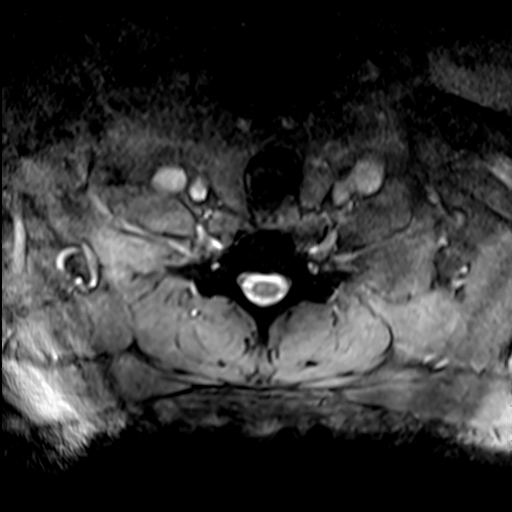

[Series 22: T2 · axial · 4.0mm · 0.56mm/px · z∈[-319,-259]mm · 4 of 13 slices shown (3 of 3)]
[im 1/13]
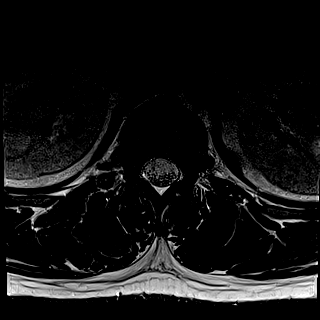
[im 5/13]
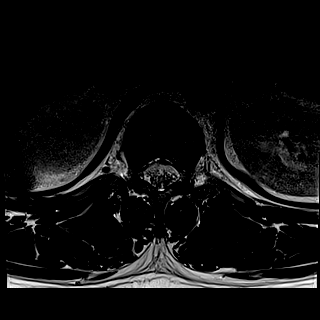
[im 9/13]
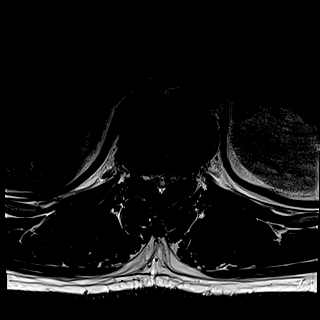
[im 13/13]
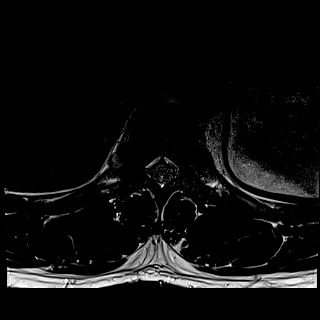

[38 of 48 positions shown; findings below may reference images not displayed]

FINDINGS: Alignment:  Normal.

Vertebrae: No fracture, evidence of discitis, or bone lesion.

Cord: Edema is seen within the cord at T11-12. Cord signal is
otherwise normal.

Paraspinal and other soft tissues: Negative.

Disc levels:

The T1-2 to T8-9 levels are negative.

T9-10: There is a shallow central protrusion without central canal
or foraminal stenosis.

T10-11: Negative.

T11-12: The patient has a very large, broad-based disc protrusion
flattening the cord. Neural foramina appear open.
IMPRESSION: Large central protrusion at T11-12 flattens the cord. There is edema
within the cord at this level due to compression

Critical Value/emergent results were called by telephone at the time
of interpretation on [DATE] at [DATE] to provider TIGER,
who verbally acknowledged these results.

## 2019-12-02 NOTE — Telephone Encounter (Signed)
Critical results called by radiology on patient's MRI thoracic spine showing large disc protrusion at T11-12 with spinal cord compression and edema at this level.    I have discussed these results with patient and arranged for expedited evaluation at [facility] on 8/12 at 2:45p with Dr. Maurice Small.   Patient current denies any new weakness, bowel/bladder dysfunction, or progressive symptoms.  He had toe surgery and has gait difficulty related to this.   Marisa Hufstetler K. Allena Katz, DO

## 2019-12-04 DIAGNOSIS — M5416 Radiculopathy, lumbar region: Secondary | ICD-10-CM | POA: Insufficient documentation

## 2019-12-05 ENCOUNTER — Telehealth: Payer: Self-pay

## 2019-12-05 DIAGNOSIS — M869 Osteomyelitis, unspecified: Secondary | ICD-10-CM | POA: Diagnosis not present

## 2019-12-05 DIAGNOSIS — L02619 Cutaneous abscess of unspecified foot: Secondary | ICD-10-CM | POA: Diagnosis not present

## 2019-12-05 DIAGNOSIS — D649 Anemia, unspecified: Secondary | ICD-10-CM | POA: Insufficient documentation

## 2019-12-05 DIAGNOSIS — L97524 Non-pressure chronic ulcer of other part of left foot with necrosis of bone: Secondary | ICD-10-CM | POA: Diagnosis not present

## 2019-12-05 DIAGNOSIS — L0390 Cellulitis, unspecified: Secondary | ICD-10-CM | POA: Diagnosis not present

## 2019-12-05 DIAGNOSIS — M2041 Other hammer toe(s) (acquired), right foot: Secondary | ICD-10-CM | POA: Insufficient documentation

## 2019-12-05 DIAGNOSIS — M21371 Foot drop, right foot: Secondary | ICD-10-CM | POA: Insufficient documentation

## 2019-12-05 NOTE — Telephone Encounter (Signed)
Telephone call to pt, to advised him of Dr. Everlena Cooper.   Per pt he was seen by [facility] on 12/04/19. Per Pt they want him to  Please keep EMG appt with [place]. They want to see the results.

## 2019-12-05 NOTE — Telephone Encounter (Signed)
-----   Message from Drema Dallas, DO sent at 12/05/2019 12:38 PM EDT ----- In light of the MRI findings, I would like to cancel the EMG.  I want him to have the "slipped disc" taken care of at this time.

## 2019-12-08 ENCOUNTER — Ambulatory Visit (INDEPENDENT_AMBULATORY_CARE_PROVIDER_SITE_OTHER): Payer: BC Managed Care – PPO | Admitting: Internal Medicine

## 2019-12-08 ENCOUNTER — Encounter: Payer: Self-pay | Admitting: Internal Medicine

## 2019-12-08 ENCOUNTER — Telehealth: Payer: Self-pay

## 2019-12-08 ENCOUNTER — Other Ambulatory Visit: Payer: Self-pay

## 2019-12-08 DIAGNOSIS — M21371 Foot drop, right foot: Secondary | ICD-10-CM | POA: Diagnosis not present

## 2019-12-08 DIAGNOSIS — D649 Anemia, unspecified: Secondary | ICD-10-CM | POA: Diagnosis not present

## 2019-12-08 DIAGNOSIS — M2041 Other hammer toe(s) (acquired), right foot: Secondary | ICD-10-CM

## 2019-12-08 DIAGNOSIS — M2042 Other hammer toe(s) (acquired), left foot: Secondary | ICD-10-CM

## 2019-12-08 DIAGNOSIS — M21372 Foot drop, left foot: Secondary | ICD-10-CM

## 2019-12-08 NOTE — Telephone Encounter (Signed)
Verbal orders given to [facility] with [facility] to continue patient's antibiotic until 12/23/19 per Dr. Orvan Falconer. Melissa verbalized understanding.  Serene Kopf T Pricilla Loveless

## 2019-12-08 NOTE — Progress Notes (Signed)
[place] for Infectious Disease  Reason for Consult: Left great toe osteomyelitis and soft tissue infection Referring Provider: Dr. Hardie Pulley  Assessment: He seems to be improving on therapy for his polymicrobial soft tissue infection and possible osteomyelitis.  Plan: 1. Continue ceftriaxone and metronidazole 2. Follow-up on 12/23/2019  Patient Active Problem List   Diagnosis Date Noted  . Osteomyelitis of foot, left, acute ([place])     Priority: High  . Ulcer of great toe, left, with necrosis of bone (HCC)     Priority: High  . Hammer toes, bilateral 12/05/2019  . Foot drop, bilateral 12/05/2019  . Morbid obesity ([place]) 12/05/2019  . Normocytic anemia 12/05/2019  . Planned postoperative wound closure     Patient's Medications  New Prescriptions   No medications on file  Previous Medications   ACETAMINOPHEN (TYLENOL) 500 MG TABLET    Take 500 mg by mouth every 6 (six) hours as needed for moderate pain.   CEFTRIAXONE (ROCEPHIN) IVPB    Inject 2 g into the vein daily. Indication:  Great toe osteomyelitis First Dose: Yes Last Day of Therapy:  12/17/2019 Labs - Once weekly:  CBC/D and CMP Labs - Every other week:  ESR and CRP Method of administration: IV Push Method of administration may be changed at the discretion of home infusion pharmacist based upon assessment of the patient and/or caregiver's ability to self-administer the medication ordered.   METRONIDAZOLE (FLAGYL) 500 MG TABLET    Take 1 tablet (500 mg total) by mouth 3 (three) times daily.   NICOTINE (NICODERM CQ - DOSED IN MG/24 HR) 7 MG/24HR PATCH    Place 1 patch (7 mg total) onto the skin daily.   OXYCODONE-ACETAMINOPHEN (PERCOCET) 5-325 MG TABLET    Take 1 tablet by mouth every 4 (four) hours as needed for severe pain.   SILVER SULFADIAZINE (SILVADENE) 1 % CREAM    Apply pea-sized amount to wound daily.   SODIUM CHLORIDE 0.9 % INFUSION    Inject 10 mLs into the vein continuous.  Modified  Medications   No medications on file  Discontinued Medications   No medications on file    HPI: Mark Stein is a 34 y.o. male with bilateral foot drop and hammertoes.  He had surgery on both great toes last year.  Over the last 4 years he has had recurrent plantar ulcers underneath his left great toe.  This recurred recently.  He had sudden onset of fever and drainage from the ulcer leading to admission on 11/06/2019.  An MRI revealed evidence of left first proximal phalanx osteomyelitis.  He underwent surgery the following day and had the distal phalanx resected.  The bone biopsy showed only benign bone.  Gram stain showed mixed bacterial flora.  Cultures grew group A strep and mixed anaerobes.  A PICC was placed and he was discharged on IV ceftriaxone and metronidazole.  He has now completed 32 days of therapy.  He has not had any problems tolerating his PICC or antibiotics.  He says that the swelling of his toe has improved.  Review of Systems: Review of Systems  Constitutional: Negative for chills, diaphoresis and fever.  Gastrointestinal: Positive for diarrhea. Negative for abdominal pain, nausea and vomiting.       He had diarrhea when he was first discharged from the hospital but this has gotten much better.  Musculoskeletal: Positive for joint pain.      Past Medical History:  Diagnosis Date  .  Medical history non-contributory     Social History   Tobacco Use  . Smoking status: Former Smoker    Packs/day: 0.25    Years: 0.50    Pack years: 0.12    Types: Cigars    Quit date: 10/23/2019    Years since quitting: 0.1  . Smokeless tobacco: Never Used  Vaping Use  . Vaping Use: Never used  Substance Use Topics  . Alcohol use: Not Currently    Comment: none since 3 weeks ago   . Drug use: Never    No family history on file. Allergies  Allergen Reactions  . Multihance [Gadobenate] Nausea Only    Shortly after admin patient became very nauseated     OBJECTIVE: Vitals:   12/08/19 1507  BP: (!) 149/93  Pulse: 97  Temp: 97.7 F (36.5 C)  TempSrc: Oral   There is no height or weight on file to calculate BMI.   Physical Exam Constitutional:      Comments: He is pleasant and in no distress.  Musculoskeletal:     Comments: He has staples in place in his dorsal and plantar incisions.  There is no erythema, drainage or malodor.  Skin:    Comments: His right arm PICC site looks good.       Sedimentation rate 12/02/2019 elevated at 40 C-reactive protein 12/02/2019 normal at 2  Microbiology: No results found for this or any previous visit (from the past 240 hour(s)).  Michel Bickers, MD [facility] for [facility] [phone] pager   [phone] cell 12/08/2019, 4:43 PM

## 2019-12-09 ENCOUNTER — Other Ambulatory Visit: Payer: Self-pay

## 2019-12-09 ENCOUNTER — Ambulatory Visit (INDEPENDENT_AMBULATORY_CARE_PROVIDER_SITE_OTHER): Payer: BC Managed Care – PPO | Admitting: Podiatry

## 2019-12-09 DIAGNOSIS — L97522 Non-pressure chronic ulcer of other part of left foot with fat layer exposed: Secondary | ICD-10-CM

## 2019-12-09 MED ORDER — OXYCODONE-ACETAMINOPHEN 5-325 MG PO TABS: 1.0000 | ORAL_TABLET | ORAL | 0 refills | Status: DC | PRN

## 2019-12-09 NOTE — Progress Notes (Signed)
  Subjective:  Patient ID: Mark Stein, male    DOB: 29-Jul-1985,  MRN: [id number]  Chief Complaint  Patient presents with  . Routine Post Op    Pt states some stomach issues with antibiotics. Denies fever/chills/nausea/vomiting.    DOS: 11/07/19 Procedure: Debridement irrigation left great toe with bone biopsy insertion of antibiotic beads   DOS: 11/21/19 Procedure: Wound closure left great   34 y.o. male presents with the above complaint. Wound healing well denies issues.  Objective:  Physical Exam: tenderness at the surgical site, local edema noted and calf supple, nontender. Incision: almost fully healed intact sutures and staples.  Assessment:   1. Skin ulcer of left foot with fat layer exposed (HCC)     Plan:  Patient was evaluated and treated and all questions answered.  Post-operative State -Sutures and staples removed -Continue offloading in boot -Plan to transition to normal shoegear next visit.  Return in about 3 weeks (around 12/30/2019).

## 2019-12-10 ENCOUNTER — Other Ambulatory Visit: Payer: Self-pay

## 2019-12-10 ENCOUNTER — Ambulatory Visit (INDEPENDENT_AMBULATORY_CARE_PROVIDER_SITE_OTHER): Payer: BC Managed Care – PPO | Admitting: Neurology

## 2019-12-10 DIAGNOSIS — M21371 Foot drop, right foot: Secondary | ICD-10-CM

## 2019-12-10 DIAGNOSIS — M21372 Foot drop, left foot: Secondary | ICD-10-CM

## 2019-12-10 DIAGNOSIS — M5417 Radiculopathy, lumbosacral region: Secondary | ICD-10-CM

## 2019-12-10 DIAGNOSIS — M869 Osteomyelitis, unspecified: Secondary | ICD-10-CM | POA: Diagnosis not present

## 2019-12-10 DIAGNOSIS — L97524 Non-pressure chronic ulcer of other part of left foot with necrosis of bone: Secondary | ICD-10-CM | POA: Diagnosis not present

## 2019-12-10 DIAGNOSIS — L0390 Cellulitis, unspecified: Secondary | ICD-10-CM | POA: Diagnosis not present

## 2019-12-10 DIAGNOSIS — L02619 Cutaneous abscess of unspecified foot: Secondary | ICD-10-CM | POA: Diagnosis not present

## 2019-12-10 DIAGNOSIS — L98491 Non-pressure chronic ulcer of skin of other sites limited to breakdown of skin: Secondary | ICD-10-CM | POA: Diagnosis not present

## 2019-12-10 NOTE — Procedures (Signed)
[facility] Neu[facility]  [address] [place], [address]  [place], [place] [address] Tel: [phone] Fax:  [phone] Test Date:  12/10/2019  Patient: Mark Stein DOB: [birth year] Physician: Nita Sickle, DO  Sex: Male Height: 6' " Ref Phys: Shon Millet, D.O.  ID#: [id number] Temp: 32.0C Technician:    Patient Complaints: This is a 34 year old man with severe thoracic and lumbar central canal stenosis referred for evaluation of bilateral foot drop.  NCV & EMG Findings: Electrodiagnostic testing was performed predominantly on the right lower extremity select areas of the left lower extremity as the left foot is has a healing ulcer wrapped in dressing.  Findings are as follows: 1. Right sural and superficial peroneal sensory responses are within normal limits. 2. Right peroneal motor response is absent at the extensor digitorum brevis and tibialis anterior.  Left peroneal motor response at the tibialis anterior is absent.  Right tibial motor response shows reduced amplitude (5.9 mV). 3. Right tibial H reflex study shows prolonged latency. 4. Active on chronic motor axonal loss changes are seen affecting the L5 myotomes bilaterally, with no motor unit recruitment seen in the posterior tibialis and anterior tibialis muscles despite maximal activation.  Additionally, chronic motor axonal loss changes are seen affecting the L3-L4 and S1 myotomes bilaterally.  Active denervation is present in the lower lumbar spine paraspinal muscles.  Impression: 1. Active on chronic L5 radiculopathy affecting bilateral lower extremities, very severe. 2. Chronic L3-4 radiculopathy affecting bilateral lower extremities, moderate. 3. Chronic S1 radiculopathy affecting bilateral lower extremities, mild. 4. There is no evidence of a sensorimotor polyneuropathy or peroneal neuropathy.   ___________________________ Nita Sickle, DO    Nerve Conduction Studies Anti Sensory Summary Table   Stim Site NR Peak (ms)  Norm Peak (ms) P-T Amp (V) Norm P-T Amp  Right Sup Peroneal Anti Sensory (Ant Lat Mall)  32C  12 cm    2.7 <4.5 11.5 >5  Right Sural Anti Sensory (Lat Mall)  32C  Calf    3.1 <4.5 17.7 >5   Motor Summary Table   Stim Site NR Onset (ms) Norm Onset (ms) O-P Amp (mV) Norm O-P Amp Site1 Site2 Delta-0 (ms) Dist (cm) Vel (m/s) Norm Vel (m/s)  Right Peroneal Motor (Ext Dig Brev)  32C  Ankle NR  <5.5  >3 B Fib Ankle  0.0  >40  B Fib NR     Poplt B Fib  0.0  >40  Poplt NR            Left Peroneal TA Motor (Tib Ant)  32C  Fib Head NR  <4.0  >4 Poplit Fib Head  0.0  >40  Poplit NR            Right Peroneal TA Motor (Tib Ant)  32C  Fib Head NR  <4.0  >4 Poplit Fib Head  0.0  >40  Poplit NR            Right Tibial Motor (Abd Hall Brev)  32C  Ankle    3.7 <6.0 5.9 >8 Knee Ankle 10.8 44.0 41 >40  Knee    14.5  4.2          H Reflex Studies   NR H-Lat (ms) Lat Norm (ms) L-R H-Lat (ms)  Right Tibial (Gastroc)  32C     37.82 <35    EMG   Side Muscle Ins Act Fibs Psw Fasc Number Recrt Dur Dur. Amp Amp. Poly Poly. Comment  Right AntTibialis Nml 2+ Nml  Nml NE None - - - - - - ATR  Right Fibularis Long Nml 1+ Nml Nml SMU Rapid Some 1+ Some 1+ Nml Nml N/A  Right PostTibialis Nml 1+ Nml Nml NE None - - - - - - N/A  Right RectFemoris Nml Nml Nml Nml 2- Rapid Some 1+ Some 1+ Some 1+ N/A  Right BicepsFemS Nml Nml Nml Nml 1- Rapid Few 1+ Few 1+ Nml Nml N/A  Right GluteusMed Nml Nml Nml Nml 1- Rapid Some 1+ Some 1+ Nml Nml N/A  Right AdductorLong Nml Nml Nml Nml 1- Rapid Some 1+ Some 1+ Nml Nml N/A  Right Gastroc Nml Nml Nml Nml 1- Rapid Some 1+ Some 1+ Nml Nml N/A  Left AntTibialis Nml 2+ Nml Nml NE None - - - - - - ATR  Left Gastroc Nml Nml Nml Nml 1- Rapid Few 1+ Few 1+ Few 1+ N/A  Left Fibularis Long Nml 2+ Nml Nml SMU Rapid All 1+ All 1+ Nml Nml N/A  Left RectFemoris Nml Nml Nml Nml 1- Rapid Some 1+ Some 1+ Some 1+ N/A  Left GluteusMed Nml 1+ Nml Nml 2- Rapid Some 1+ Some 1+ Nml Nml N/A    Left Lumbo Parasp Low Nml 1+ Nml Nml NE None - - - - - - N/A      Waveforms:

## 2019-12-11 ENCOUNTER — Telehealth: Payer: Self-pay

## 2019-12-11 NOTE — Telephone Encounter (Signed)
-----   Message from Drema Dallas, DO sent at 12/11/2019  7:43 AM EDT ----- Nerve study does show abnormalities in the nerves from the back that contribute to the foot drop.  So, the bilateral foot drop is most likely from the pinched nerves in the back.  The disc bulge in the spinal cord is likely an incidental finding but will need to be treated as it potentially can cause problems.  Follow up with neurosurgery.

## 2019-12-11 NOTE — Telephone Encounter (Signed)
Pt advised of his EMG results

## 2019-12-12 DIAGNOSIS — M869 Osteomyelitis, unspecified: Secondary | ICD-10-CM | POA: Diagnosis not present

## 2019-12-12 DIAGNOSIS — L97524 Non-pressure chronic ulcer of other part of left foot with necrosis of bone: Secondary | ICD-10-CM | POA: Diagnosis not present

## 2019-12-12 DIAGNOSIS — L02619 Cutaneous abscess of unspecified foot: Secondary | ICD-10-CM | POA: Diagnosis not present

## 2019-12-12 DIAGNOSIS — L0390 Cellulitis, unspecified: Secondary | ICD-10-CM | POA: Diagnosis not present

## 2019-12-15 DIAGNOSIS — L0390 Cellulitis, unspecified: Secondary | ICD-10-CM | POA: Diagnosis not present

## 2019-12-15 DIAGNOSIS — L97524 Non-pressure chronic ulcer of other part of left foot with necrosis of bone: Secondary | ICD-10-CM | POA: Diagnosis not present

## 2019-12-15 DIAGNOSIS — M869 Osteomyelitis, unspecified: Secondary | ICD-10-CM | POA: Diagnosis not present

## 2019-12-15 DIAGNOSIS — L02619 Cutaneous abscess of unspecified foot: Secondary | ICD-10-CM | POA: Diagnosis not present

## 2019-12-15 DIAGNOSIS — L98491 Non-pressure chronic ulcer of skin of other sites limited to breakdown of skin: Secondary | ICD-10-CM | POA: Diagnosis not present

## 2019-12-16 DIAGNOSIS — L97524 Non-pressure chronic ulcer of other part of left foot with necrosis of bone: Secondary | ICD-10-CM | POA: Diagnosis not present

## 2019-12-16 DIAGNOSIS — M869 Osteomyelitis, unspecified: Secondary | ICD-10-CM | POA: Diagnosis not present

## 2019-12-16 DIAGNOSIS — L98491 Non-pressure chronic ulcer of skin of other sites limited to breakdown of skin: Secondary | ICD-10-CM | POA: Diagnosis not present

## 2019-12-16 DIAGNOSIS — L0390 Cellulitis, unspecified: Secondary | ICD-10-CM | POA: Diagnosis not present

## 2019-12-16 DIAGNOSIS — L02619 Cutaneous abscess of unspecified foot: Secondary | ICD-10-CM | POA: Diagnosis not present

## 2019-12-18 ENCOUNTER — Telehealth: Payer: Self-pay

## 2019-12-18 DIAGNOSIS — L0390 Cellulitis, unspecified: Secondary | ICD-10-CM | POA: Diagnosis not present

## 2019-12-18 DIAGNOSIS — M869 Osteomyelitis, unspecified: Secondary | ICD-10-CM | POA: Diagnosis not present

## 2019-12-18 DIAGNOSIS — L97524 Non-pressure chronic ulcer of other part of left foot with necrosis of bone: Secondary | ICD-10-CM | POA: Diagnosis not present

## 2019-12-18 DIAGNOSIS — L02619 Cutaneous abscess of unspecified foot: Secondary | ICD-10-CM | POA: Diagnosis not present

## 2019-12-18 NOTE — Telephone Encounter (Signed)
Patient called office today stating he does not have anymore antibiotics. Is not sure if needs to continue with treatment or if he is done. Per last note [facility] to [facility] through 8/31. Melissa with [facility] will send in more medication to patient.  Will forward message to MD to advise if patient needs to continue metroNIDAZOLE (FLAGYL) 500 MG tablet if so new prescription needs to be sent to the pharmacy.  Patient is using [facility].  Lorenso Courier, CMA

## 2019-12-19 NOTE — Telephone Encounter (Signed)
I would like him to stay on ceftriaxone and metronidazole at least until his visit with me next week.

## 2019-12-22 ENCOUNTER — Other Ambulatory Visit: Payer: Self-pay

## 2019-12-22 ENCOUNTER — Telehealth: Payer: Self-pay

## 2019-12-22 MED ORDER — METRONIDAZOLE 500 MG PO TABS: 500.0000 mg | ORAL_TABLET | Freq: Three times a day (TID) | ORAL | 0 refills | Status: DC

## 2019-12-22 NOTE — Telephone Encounter (Signed)
COVID-19 Pre-Screening Questions:12/22/19  Do you currently have a fever (>100 F), chills or unexplained body aches? NO  Are you currently experiencing new cough, shortness of breath, sore throat, runny nose? NO  Have you been in contact with someone that is currently pending confirmation of Covid19 testing or has been confirmed to have the Covid19 virus?NO  **If the patient answers NO to ALL questions -  advise the patient to please call the clinic before coming to the office should any symptoms develop.     

## 2019-12-23 ENCOUNTER — Ambulatory Visit (INDEPENDENT_AMBULATORY_CARE_PROVIDER_SITE_OTHER): Payer: BC Managed Care – PPO | Admitting: Podiatry

## 2019-12-23 ENCOUNTER — Telehealth: Payer: Self-pay

## 2019-12-23 ENCOUNTER — Encounter: Payer: Self-pay | Admitting: Internal Medicine

## 2019-12-23 ENCOUNTER — Other Ambulatory Visit: Payer: Self-pay

## 2019-12-23 ENCOUNTER — Ambulatory Visit (INDEPENDENT_AMBULATORY_CARE_PROVIDER_SITE_OTHER): Payer: BC Managed Care – PPO | Admitting: Internal Medicine

## 2019-12-23 DIAGNOSIS — L97522 Non-pressure chronic ulcer of other part of left foot with fat layer exposed: Secondary | ICD-10-CM

## 2019-12-23 DIAGNOSIS — M86172 Other acute osteomyelitis, left ankle and foot: Secondary | ICD-10-CM

## 2019-12-23 DIAGNOSIS — L97524 Non-pressure chronic ulcer of other part of left foot with necrosis of bone: Secondary | ICD-10-CM | POA: Diagnosis not present

## 2019-12-23 DIAGNOSIS — L02619 Cutaneous abscess of unspecified foot: Secondary | ICD-10-CM | POA: Diagnosis not present

## 2019-12-23 DIAGNOSIS — L0390 Cellulitis, unspecified: Secondary | ICD-10-CM | POA: Diagnosis not present

## 2019-12-23 DIAGNOSIS — M869 Osteomyelitis, unspecified: Secondary | ICD-10-CM | POA: Diagnosis not present

## 2019-12-23 NOTE — Assessment & Plan Note (Signed)
I am hopeful that his infection has been cure through a combination of surgery and 6-1/2 weeks of antibiotic therapy.  I will have him stop ceftriaxone today and have his PICC pulled.  I have scheduled a video visit with him on January 29, 2020

## 2019-12-23 NOTE — Progress Notes (Signed)
[facility] for Infectious Disease  Patient Active Problem List   Diagnosis Date Noted   Osteomyelitis of foot, left, acute ([place])     Priority: High   Ulcer of great toe, left, with necrosis of bone (HCC)     Priority: High   Hammer toes, bilateral 12/05/2019   Foot drop, bilateral 12/05/2019   Morbid obesity ([place]) 12/05/2019   Normocytic anemia 12/05/2019   Planned postoperative wound closure     Patient's Medications  New Prescriptions   No medications on file  Previous Medications   ACETAMINOPHEN (TYLENOL) 500 MG TABLET    Take 500 mg by mouth every 6 (six) hours as needed for moderate pain.   NICOTINE (NICODERM CQ - DOSED IN MG/24 HR) 7 MG/24HR PATCH    Place 1 patch (7 mg total) onto the skin daily.   OXYCODONE-ACETAMINOPHEN (PERCOCET) 5-325 MG TABLET    Take 1 tablet by mouth every 4 (four) hours as needed for severe pain.   SILVER SULFADIAZINE (SILVADENE) 1 % CREAM    Apply pea-sized amount to wound daily.   SODIUM CHLORIDE 0.9 % INFUSION    Inject 10 mLs into the vein continuous.  Modified Medications   No medications on file  Discontinued Medications   CEFTRIAXONE (ROCEPHIN) IVPB    Inject 2 g into the vein daily.   METRONIDAZOLE (FLAGYL) 500 MG TABLET    Take 1 tablet (500 mg total) by mouth 3 (three) times daily.    Subjective: Mark Stein is in for his routine follow-up visit.  He has had problems with bilateral foot drop and hammertoes.  He had surgery on both great toes last year. Over the last 4 years he has had recurrent plantar ulcers underneath his left great toe.  This recurred recently.  He had sudden onset of fever and drainage from the ulcer leading to admission on 11/06/2019.  An MRI revealed evidence of left first proximal phalanx osteomyelitis.  He underwent surgery the following day and had the distal phalanx resected.  The bone biopsy showed only benign bone.  Gram stain showed mixed bacterial flora.  Cultures grew group A strep and mixed  anaerobes.  A PICC was placed and he was discharged on IV ceftriaxone and metronidazole.  He has now completed 47 days of therapy.    He was having some mild, intermittent diarrhea until his metronidazole ran out last week.  The diarrhea resolved about 2 days later.  He has not had any problems tolerating his PICC.  He says that the swelling of his toe has improved.  Review of Systems: Review of Systems  Constitutional: Negative for chills, diaphoresis and fever.  Gastrointestinal: Positive for diarrhea. Negative for abdominal pain, nausea and vomiting.  Musculoskeletal: Positive for joint pain.    Past Medical History:  Diagnosis Date   Medical history non-contributory     Social History   Tobacco Use   Smoking status: Former Smoker    Packs/day: 0.25    Years: 0.50    Pack years: 0.12    Types: Cigars    Quit date: 10/23/2019    Years since quitting: 0.1   Smokeless tobacco: Never Used  Vaping Use   Vaping Use: Never used  Substance Use Topics   Alcohol use: Not Currently    Comment: none since 3 weeks ago    Drug use: Never    No family history on file.  Allergies  Allergen Reactions   Multihance [Gadobenate] Nausea  Only    Shortly after admin patient became very nauseated    Objective: Vitals:   12/23/19 1515 12/23/19 1516  BP:  (!) 146/96  Pulse:  97  Temp:  98.2 F (36.8 C)  SpO2:  98%  Weight: 294 lb (133.4 kg)   Height: 6' (1.829 m)    Body mass index is 39.87 kg/m.  Physical Exam Constitutional:      Comments: He is pleasant and talkative.  Genitourinary:    Comments: Plan sutures and staples were removed from his left great toe incision this morning.  There is 1 small open area in the midportion of the incision.  He has noted only scant drainage on his Band-Aid dressing.  There is no malodor or surrounding cellulitis. Skin:    Findings: No rash.     Comments: He has a compressive sleeve over his right arm PICC.  Psychiatric:        Mood  and Affect: Mood normal.       Lab Results December 15, 2019 ESR slightly elevated at 37 C-reactive protein normal at 3    Problem List Items Addressed This Visit      High   Osteomyelitis of foot, left, acute ([place])    I am hopeful that his infection has been cure through a combination of surgery and 6-1/2 weeks of antibiotic therapy.  I will have him stop ceftriaxone today and have his PICC pulled.  I have scheduled a video visit with him on January 29, 2020          Michel Bickers, MD [facility] [place] [phone] pager   [phone] cell 12/23/2019, 3:45 PM

## 2019-12-23 NOTE — Progress Notes (Signed)
°  Subjective:  Patient ID: Mark Stein, male    DOB: 10/07/1985,  MRN: [id number]  Chief Complaint  Patient presents with   Routine Post Op    2 wk pov DOS 7.30.2021 left foot wound debridement    DOS: 11/07/19 Procedure: Debridement irrigation left great toe with bone biopsy insertion of antibiotic beads   DOS: 11/21/19 Procedure: Wound closure left great   34 y.o. male presents with the above complaint. Supposed to end his abx today, has appt with ID.  Objective:  Physical Exam: tenderness at the surgical site, no edema noted and calf supple, nontender. Incision: almost fully healed small open area plantarly  Assessment:   1. Skin ulcer of left foot with fat layer exposed (HCC)     Plan:  Patient was evaluated and treated and all questions answered.  Post-operative State -Remaining sutures and staples removed. -Unable to return to work at this time due to persistent draining wound -Continue offloading in surgical shoe. Shoe dispensed -Plan to transition to normal shoegear next visit.  Return in about 3 weeks (around 01/13/2020) for Post-Op (No XRs).

## 2019-12-23 NOTE — Telephone Encounter (Signed)
Spoke to [place] at [facility]. Relayed verbal orders per Dr. Orvan Falconer okay to pull PICC today. Orders repeated and verified.  Sandie Ano, RN

## 2019-12-26 DIAGNOSIS — M5416 Radiculopathy, lumbar region: Secondary | ICD-10-CM | POA: Diagnosis not present

## 2020-01-13 ENCOUNTER — Other Ambulatory Visit: Payer: Self-pay

## 2020-01-13 ENCOUNTER — Ambulatory Visit (INDEPENDENT_AMBULATORY_CARE_PROVIDER_SITE_OTHER): Payer: BC Managed Care – PPO | Admitting: Podiatry

## 2020-01-13 ENCOUNTER — Encounter: Payer: Self-pay | Admitting: Podiatry

## 2020-01-13 DIAGNOSIS — L97522 Non-pressure chronic ulcer of other part of left foot with fat layer exposed: Secondary | ICD-10-CM

## 2020-01-13 NOTE — Progress Notes (Signed)
  Subjective:  Patient ID: Mark Stein, male    DOB: June 15, 1985,  MRN: [id number]  Chief Complaint  Patient presents with  . Routine Post Op    POV DOS 7.30.2021 Left foot wound debridement. Pt states small opening in wound with occasional blood drainage. Denies fever/nausea/vomiting/chills. No other concerns.    34 y.o. male presents for wound care. Hx confirmed with patient.  Objective:  Physical Exam: Wound Location: left 1st Met Wound Measurement: 1.6*2 Wound Base: Granular/Healthy Peri-wound: Calloused Exudate: Scant/small amount Serosanguinous exudate wound without warmth, erythema, signs of acute infection  Assessment:   1. Skin ulcer of left foot with fat layer exposed ([place])    Plan:  Patient was evaluated and treated and all questions answered.  Ulcer left hallux -Dressing applied consisting of prisma and band-aid -Offload ulcer with surgical shoe -Wound cleansed and debrided -Not wearing his toe drop brace, advised to wear it as much as he can and to not go barefoot even at home -Consider offloading insert. Will have pt see orthotist.  Procedure: Selective Debridement of Wound Rationale: Removal of devitalized tissue from the wound to promote healing.  Pre-Debridement Wound Measurements: 1.6 cm x 2 cm x 0.2 cm  Post-Debridement Wound Measurements: same as pre-debridement. Type of Debridement: sharp selective Tissue Removed: Devitalized soft-tissue Dressing: Dry, sterile, compression dressing. Disposition: Patient tolerated procedure well. Patient to return in 1 week for follow-up.   Return in about 3 weeks (around 02/03/2020).

## 2020-01-22 ENCOUNTER — Other Ambulatory Visit: Payer: Self-pay | Admitting: Podiatry

## 2020-01-22 MED ORDER — OXYCODONE-ACETAMINOPHEN 5-325 MG PO TABS: 1.0000 | ORAL_TABLET | ORAL | 0 refills | Status: DC | PRN

## 2020-01-29 ENCOUNTER — Other Ambulatory Visit: Payer: Self-pay

## 2020-01-29 ENCOUNTER — Telehealth (INDEPENDENT_AMBULATORY_CARE_PROVIDER_SITE_OTHER): Payer: BC Managed Care – PPO | Admitting: Internal Medicine

## 2020-01-29 DIAGNOSIS — M86172 Other acute osteomyelitis, left ankle and foot: Secondary | ICD-10-CM | POA: Diagnosis not present

## 2020-01-29 NOTE — Progress Notes (Signed)
Virtual Visit via Video Note  I connected with Mark Stein on 01/29/20 at 11:15 AM EDT by a video enabled telemedicine application and verified that I am speaking with the correct person using two identifiers.  Location: Patient: Home Provider: [facility]   I discussed the limitations of evaluation and management by telemedicine and the availability of in person appointments. The patient expressed understanding and agreed to proceed.  History of Present Illness:  I conducted a video visit today with Mark Stein.  He had osteomyelitis of his left great toe.  He completed 47 days of IV ceftriaxone and oral metronidazole on 12/23/2019.  The wound on his toe continues to get smaller and smaller.  He says that he can barely see it at this point.  There is only a tiny scab over the wound.  He will occasionally have a small drop of blood when he is up walking.  He has not had any fever, redness, swelling or other thing to indicate any infection.  He saw his podiatrist, Dr. Ventura Sellers, 2 weeks ago.  His note indicated that there was no sign of infection at that time.   Observations/Objective:    Assessment and Plan: I am quite hopeful that his osteomyelitis has now been cured.  Follow Up Instructions: Follow-up here as needed   I discussed the assessment and treatment plan with the patient. The patient was provided an opportunity to ask questions and all were answered. The patient agreed with the plan and demonstrated an understanding of the instructions.   The patient was advised to call back or seek an in-person evaluation if the symptoms worsen or if the condition fails to improve as anticipated.  I provided 13 minutes of non-face-to-face time during this encounter.   Cliffton Asters, MD

## 2020-02-03 ENCOUNTER — Ambulatory Visit (INDEPENDENT_AMBULATORY_CARE_PROVIDER_SITE_OTHER): Payer: BC Managed Care – PPO | Admitting: Podiatry

## 2020-02-03 ENCOUNTER — Other Ambulatory Visit: Payer: Self-pay

## 2020-02-03 DIAGNOSIS — M216X1 Other acquired deformities of right foot: Secondary | ICD-10-CM | POA: Diagnosis not present

## 2020-02-03 DIAGNOSIS — L97522 Non-pressure chronic ulcer of other part of left foot with fat layer exposed: Secondary | ICD-10-CM

## 2020-02-03 DIAGNOSIS — M216X2 Other acquired deformities of left foot: Secondary | ICD-10-CM | POA: Diagnosis not present

## 2020-02-03 MED ORDER — SILVER SULFADIAZINE 1 % EX CREA: TOPICAL_CREAM | CUTANEOUS | 0 refills | Status: DC

## 2020-02-03 NOTE — Addendum Note (Signed)
Addended by: Ventura Sellers on: 02/03/2020 10:13 AM   Modules accepted: Orders

## 2020-02-03 NOTE — Progress Notes (Signed)
  Subjective:  Patient ID: Mark Stein, male    DOB: October 30, 1985,  MRN: [id number]  Chief Complaint  Patient presents with  . Wound Check    Pt states healing but still having pain. Denies fever/nausea/vomiting/chills.   34 y.o. male presents for wound care. Hx confirmed with patient. Is wearing surgical shoes Objective:  Physical Exam: Wound Location: left 1st Met Wound Measurement: 0.5x0.3 Wound Base: Granular/Healthy Peri-wound: Calloused Exudate: Scant/small amount Serosanguinous exudate wound without warmth, erythema, signs of acute infection  Assessment:   1. Skin ulcer of left foot with fat layer exposed ([name])    Plan:  Patient was evaluated and treated and all questions answered.  Ulcer left hallux -Dressing applied consisting of prisma and band-aid -Offload ulcer with surgical shoe -Wound cleansed and debrided -Not wearing his toe drop brace, advised to wear it as much as he can and to not go barefoot even at home -Consider offloading insert. Will have pt see orthotist.  Procedure: Selective Debridement of Wound Rationale: Removal of devitalized tissue from the wound to promote healing.  Pre-Debridement Wound Measurements: 0.5 cm x 0.3 cm x 0.1 cm  Post-Debridement Wound Measurements: same as pre-debridement. Type of Debridement: sharp selective Tissue Removed: Devitalized soft-tissue Dressing: Dry, sterile, compression dressing. Disposition: Patient tolerated procedure well. Patient to return in 1 week for follow-up.   Return in about 3 weeks (around 02/24/2020) for Wound Care.

## 2020-02-24 ENCOUNTER — Ambulatory Visit (INDEPENDENT_AMBULATORY_CARE_PROVIDER_SITE_OTHER): Payer: BC Managed Care – PPO | Admitting: Orthotics

## 2020-02-24 ENCOUNTER — Other Ambulatory Visit: Payer: Self-pay

## 2020-02-24 DIAGNOSIS — M21372 Foot drop, left foot: Secondary | ICD-10-CM | POA: Diagnosis not present

## 2020-02-24 DIAGNOSIS — Z9889 Other specified postprocedural states: Secondary | ICD-10-CM

## 2020-02-24 DIAGNOSIS — L97522 Non-pressure chronic ulcer of other part of left foot with fat layer exposed: Secondary | ICD-10-CM

## 2020-02-24 DIAGNOSIS — M86171 Other acute osteomyelitis, right ankle and foot: Secondary | ICD-10-CM

## 2020-02-24 NOTE — Progress Notes (Signed)
Patient came in today to pick up custom made foot orthotics.  The goals were accomplished and the patient reported no dissatisfaction with said orthotics.  Patient was advised of breakin period and how to report any issues.  He has drop foot brace (non custom) but isn't using;;; this is probably further complicating the healing process of his ulcer.   Advised him he should be using.

## 2020-02-27 ENCOUNTER — Other Ambulatory Visit: Payer: Self-pay | Admitting: Podiatry

## 2020-03-02 ENCOUNTER — Other Ambulatory Visit: Payer: Self-pay

## 2020-03-02 ENCOUNTER — Encounter: Payer: Self-pay | Admitting: Podiatry

## 2020-03-02 ENCOUNTER — Ambulatory Visit (INDEPENDENT_AMBULATORY_CARE_PROVIDER_SITE_OTHER): Payer: BC Managed Care – PPO | Admitting: Podiatry

## 2020-03-02 DIAGNOSIS — M21372 Foot drop, left foot: Secondary | ICD-10-CM | POA: Diagnosis not present

## 2020-03-02 DIAGNOSIS — M86171 Other acute osteomyelitis, right ankle and foot: Secondary | ICD-10-CM

## 2020-03-02 DIAGNOSIS — L03119 Cellulitis of unspecified part of limb: Secondary | ICD-10-CM

## 2020-03-02 DIAGNOSIS — Z9889 Other specified postprocedural states: Secondary | ICD-10-CM

## 2020-03-02 DIAGNOSIS — L97522 Non-pressure chronic ulcer of other part of left foot with fat layer exposed: Secondary | ICD-10-CM | POA: Diagnosis not present

## 2020-03-02 DIAGNOSIS — L02619 Cutaneous abscess of unspecified foot: Secondary | ICD-10-CM

## 2020-03-02 NOTE — Progress Notes (Signed)
  Subjective:  Patient ID: ARDON FRANKLIN, male    DOB: [birth year],  MRN: [id number]  Chief Complaint  Patient presents with  . Foot Ulcer    Left 1st ulcer check. Pt denies fever/nausea/vomiting/chills/drainage.   . Foot Orthotics    Pt states interest in custom dropfoot brace, current one is uncomfortable.   34 y.o. male presents for wound care. Hx confirmed with patient. Has been wearing in his new orthotics, states they are uncomfortable but do not cause issues. Objective:  Physical Exam: Wound Location: left 1st Met Wound Measurement: 0.3x0.5 Wound Base: Granular/Healthy Peri-wound: Calloused Exudate: Scant/small amount Serosanguinous exudate wound without warmth, erythema, signs of acute infection  Assessment:   1. Left foot drop   2. Acute osteomyelitis of toe, right ([place])   3. Skin ulcer of left foot with fat layer exposed ([place])   4. Cellulitis and abscess of foot, except toes   5. Post-operative state    Plan:  Patient was evaluated and treated and all questions answered.  Ulcer left hallux -Much improved -Ok for return to work next week -[employer] -Would benefit from dropfoot brace bilat.   No follow-ups on file.

## 2020-03-03 MED ORDER — OXYCODONE-ACETAMINOPHEN 5-325 MG PO TABS: 1.0000 | ORAL_TABLET | ORAL | 0 refills | Status: DC | PRN

## 2020-03-04 ENCOUNTER — Encounter: Payer: Self-pay | Admitting: Podiatry

## 2020-04-02 ENCOUNTER — Ambulatory Visit (INDEPENDENT_AMBULATORY_CARE_PROVIDER_SITE_OTHER): Payer: BC Managed Care – PPO | Admitting: Podiatry

## 2020-04-02 ENCOUNTER — Other Ambulatory Visit: Payer: Self-pay

## 2020-04-02 ENCOUNTER — Encounter: Payer: Self-pay | Admitting: Podiatry

## 2020-04-02 DIAGNOSIS — L97522 Non-pressure chronic ulcer of other part of left foot with fat layer exposed: Secondary | ICD-10-CM | POA: Diagnosis not present

## 2020-04-02 MED ORDER — SILVER SULFADIAZINE 1 % EX CREA: TOPICAL_CREAM | CUTANEOUS | 0 refills | Status: AC

## 2020-04-02 NOTE — Progress Notes (Signed)
  Subjective:  Patient ID: Mark Stein, male    DOB: 16-Aug-1985,  MRN: [id number]  Chief Complaint  Patient presents with  . Foot Ulcer    Pt states no concerns, denies fever/chills/nausea/vomiting, pt does state he has occasional blood drainage and soreness.   34 y.o. male presents for wound care. Hx confirmed with patient. Wearing orthotics at work. Denies new issues. Objective:  Physical Exam: Wound Location: left 1st Met Wound Measurement: 0.2x0.4 Wound Base: Granular/Healthy Peri-wound: Calloused Exudate: Scant/small amount Serosanguinous exudate wound without warmth, erythema, signs of acute infection  Assessment:   1. Skin ulcer of left foot with fat layer exposed ([place])    Plan:  Patient was evaluated and treated and all questions answered.  Ulcer left hallux -He has made great progress. Only has small residual ulcer. I think this should resolve with time. Continue silvadene and band-aid daily. Refilled today  Procedure: Selective Debridement of Wound Rationale: Removal of devitalized tissue from the wound to promote healing.  Pre-Debridement Wound Measurements: 0.2 cm x 0.4 cm x 0.1 cm  Post-Debridement Wound Measurements: same as pre-debridement. Type of Debridement: sharp selective Tissue Removed: Devitalized soft-tissue Dressing: Dry, sterile, compression dressing. Disposition: Patient tolerated procedure well. Patient to return in 1 week for follow-up.     Return in about 6 weeks (around 05/14/2020) for Wound Care, Left.

## 2020-04-11 ENCOUNTER — Other Ambulatory Visit: Payer: Self-pay | Admitting: Podiatry

## 2020-04-13 MED ORDER — OXYCODONE-ACETAMINOPHEN 5-325 MG PO TABS: 1.0000 | ORAL_TABLET | ORAL | 0 refills | Status: DC | PRN

## 2020-05-06 DIAGNOSIS — Z1152 Encounter for screening for COVID-19: Secondary | ICD-10-CM | POA: Diagnosis not present

## 2020-05-14 ENCOUNTER — Ambulatory Visit (INDEPENDENT_AMBULATORY_CARE_PROVIDER_SITE_OTHER): Payer: BC Managed Care – PPO | Admitting: Podiatry

## 2020-05-14 DIAGNOSIS — Z5329 Procedure and treatment not carried out because of patient's decision for other reasons: Secondary | ICD-10-CM

## 2020-05-14 NOTE — Progress Notes (Signed)
No show for appt. 

## 2020-06-04 DIAGNOSIS — M79676 Pain in unspecified toe(s): Secondary | ICD-10-CM

## 2020-06-11 ENCOUNTER — Other Ambulatory Visit: Payer: Self-pay | Admitting: Podiatry

## 2020-06-11 MED ORDER — OXYCODONE-ACETAMINOPHEN 5-325 MG PO TABS: 1.0000 | ORAL_TABLET | ORAL | 0 refills | Status: DC | PRN

## 2020-06-11 NOTE — Telephone Encounter (Signed)
Please advise 

## 2020-06-25 ENCOUNTER — Ambulatory Visit: Payer: BC Managed Care – PPO

## 2020-06-25 ENCOUNTER — Ambulatory Visit (INDEPENDENT_AMBULATORY_CARE_PROVIDER_SITE_OTHER): Payer: BC Managed Care – PPO | Admitting: Podiatry

## 2020-06-25 ENCOUNTER — Ambulatory Visit (INDEPENDENT_AMBULATORY_CARE_PROVIDER_SITE_OTHER): Payer: BC Managed Care – PPO

## 2020-06-25 ENCOUNTER — Other Ambulatory Visit: Payer: Self-pay

## 2020-06-25 ENCOUNTER — Encounter: Payer: Self-pay | Admitting: Podiatry

## 2020-06-25 DIAGNOSIS — M21372 Foot drop, left foot: Secondary | ICD-10-CM | POA: Diagnosis not present

## 2020-06-25 DIAGNOSIS — S86011A Strain of right Achilles tendon, initial encounter: Secondary | ICD-10-CM

## 2020-06-25 DIAGNOSIS — L97522 Non-pressure chronic ulcer of other part of left foot with fat layer exposed: Secondary | ICD-10-CM | POA: Diagnosis not present

## 2020-06-25 NOTE — Progress Notes (Signed)
  Subjective:  Patient ID: KAMEL HAVEN, male    DOB: 1985/06/22,  MRN: [id number]  No chief complaint on file.  35 y.o. male presents for wound care. Thinks the wound is doing quite well. No bleeding from it.  New right ankle injury occurred Feb 13th. States it happened while playing basketball. Felt a pop and had pain at the time.  Objective:  Physical Exam: Wound Location: left 1st Met Wound Base:  no true open ulcer but immature epithelial cells Peri-wound: Calloused Exudate: none wound without warmth, erythema, signs of acute infection  Right ankle - decreased muscle strength right. No Achilles dell, tendon does appear intact but thinner distally. 3/5 plantarflexion. Contralateral limb good strength  Assessment:   1. Skin ulcer of left foot with fat layer exposed ([place])   2. Partial Achilles tendon tear, right, initial encounter   3. Left foot drop    Plan:  Patient was evaluated and treated and all questions answered.  Ulcer left hallux -It does appear essentially healed but with some macerated skin and dried blood underneath his callus. It is not a full open ulcer at this point. Consider modification to patient's insert/brace   Right heel injury -Symptoms concerning for partial Achilles tear. Will order [place] to evaluate. -Dispense tall CAM boot for protection    Return in about 11 days (around 07/06/2020) for Ultrasound review.

## 2020-06-28 ENCOUNTER — Telehealth: Payer: Self-pay | Admitting: Podiatry

## 2020-06-28 NOTE — Telephone Encounter (Signed)
Good Morning Dr. Samuella Cota  I spoke with Mark Stein and he said his injury occurred on a Sunday, not on the 27th. He said the injury occurred on Feb 13th. His first day out of work was 06/18/2020. Would you please amend your notes with the updated date. Thank you so much.

## 2020-06-28 NOTE — Telephone Encounter (Signed)
Addended.

## 2020-07-06 ENCOUNTER — Ambulatory Visit (INDEPENDENT_AMBULATORY_CARE_PROVIDER_SITE_OTHER): Payer: BC Managed Care – PPO | Admitting: Podiatry

## 2020-07-06 ENCOUNTER — Other Ambulatory Visit: Payer: Self-pay | Admitting: Podiatry

## 2020-07-06 DIAGNOSIS — S86011A Strain of right Achilles tendon, initial encounter: Secondary | ICD-10-CM

## 2020-07-06 NOTE — Progress Notes (Signed)
Patient still did not have US done. Appt to be rescheduled.

## 2020-07-06 NOTE — Progress Notes (Signed)
New order placed for [place] - will f/u that it is done.

## 2020-07-07 ENCOUNTER — Encounter: Payer: Self-pay | Admitting: Podiatry

## 2020-07-07 ENCOUNTER — Other Ambulatory Visit: Payer: Self-pay

## 2020-07-07 DIAGNOSIS — S86011A Strain of right Achilles tendon, initial encounter: Secondary | ICD-10-CM

## 2020-07-08 ENCOUNTER — Telehealth: Payer: Self-pay | Admitting: Podiatry

## 2020-07-08 NOTE — Telephone Encounter (Signed)
Patient is scheduled for Sunday @ [facility] radiology  pre service center needs Prior Mark Stein of his [employer] today. Needed 48 hours prior to MRI appointment due to them not being open on the weekend.

## 2020-07-08 NOTE — Telephone Encounter (Signed)
Pre-cert completed  [facility]ley Harvey [id number] valid 07/08/20 - 08/06/20 [facility] [address]  (originally gave [address] address but was told this was out of network)   Called and left VM for [employer] informing of auth #

## 2020-07-11 ENCOUNTER — Other Ambulatory Visit: Payer: Self-pay

## 2020-07-11 ENCOUNTER — Ambulatory Visit (HOSPITAL_COMMUNITY)
Admission: RE | Admit: 2020-07-11 | Discharge: 2020-07-11 | Disposition: A | Discharge status: Home / Self Care | Payer: BC Managed Care – PPO | Source: Ambulatory Visit | Attending: Podiatry | Admitting: Podiatry

## 2020-07-11 DIAGNOSIS — S86011A Strain of right Achilles tendon, initial encounter: Secondary | ICD-10-CM

## 2020-07-11 DIAGNOSIS — M19071 Primary osteoarthritis, right ankle and foot: Secondary | ICD-10-CM | POA: Diagnosis not present

## 2020-07-11 DIAGNOSIS — S93411A Sprain of calcaneofibular ligament of right ankle, initial encounter: Secondary | ICD-10-CM | POA: Diagnosis not present

## 2020-07-11 DIAGNOSIS — M722 Plantar fascial fibromatosis: Secondary | ICD-10-CM | POA: Diagnosis not present

## 2020-07-11 IMAGING — MR MR ANKLE*R* W/O CM
4 of 6 series · 18 of 40 positions shown · non-contrast
Comparison: Plain films right ankle and foot [DATE].

CLINICAL DATA: The patient suffered a right ankle injury playing
basketball with a pop on [DATE].

EXAM:
MRI OF THE RIGHT ANKLE WITHOUT CONTRAST
TECHNIQUE: Multiplanar, multisequence MR imaging of the ankle was performed. No
intravenous contrast was administered.

[Series 4: T1 · sagittal · 4.0mm · 0.35mm/px · 3 of 16 slices shown]
[im 1/16]
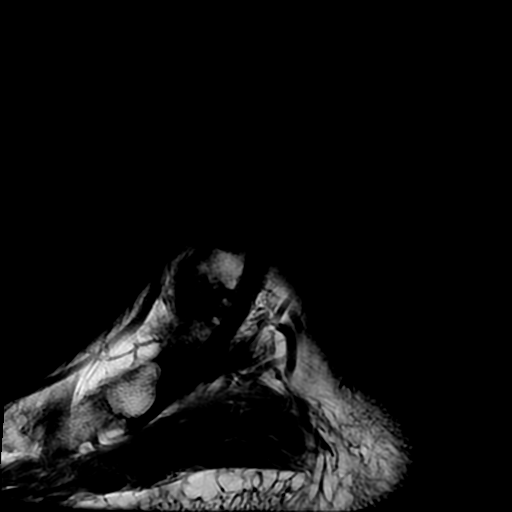
[im 8/16]
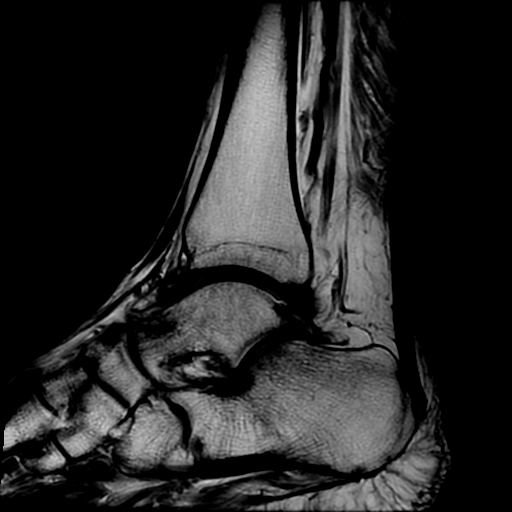
[im 16/16]
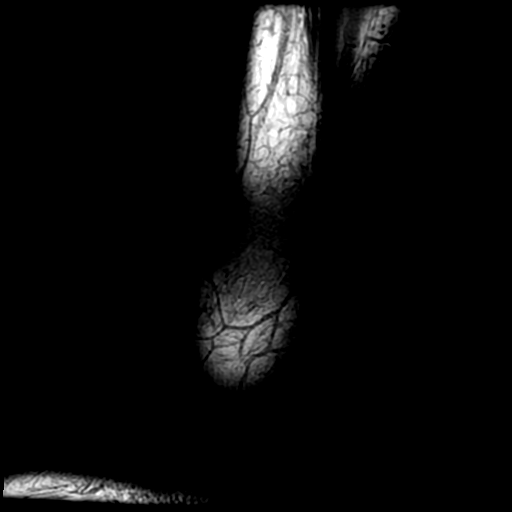

[Series 6: T1 fat-sat · axial · 3.0mm · 0.33mm/px · z∈[-40,+88]mm · 3 of 44 slices shown]
[im 6/44]
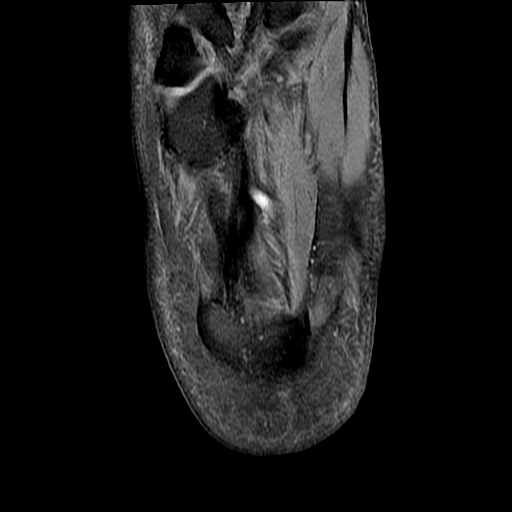
[im 22/44]
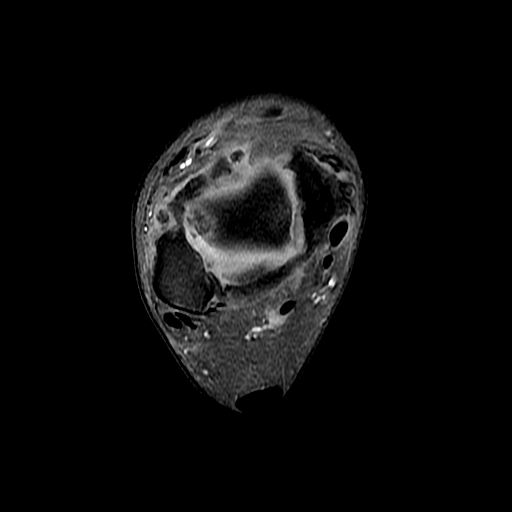
[im 38/44]
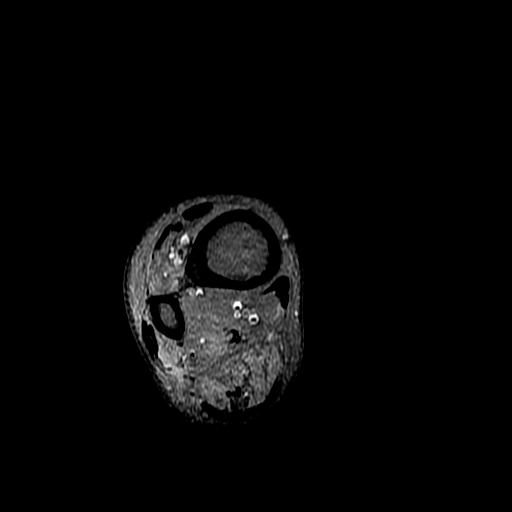

[Series 7: T2 fat-sat · axial · 3.0mm · 0.33mm/px · z∈[-60,+112]mm · 9 of 44 slices shown (1 of 2)]
[im 1/44]
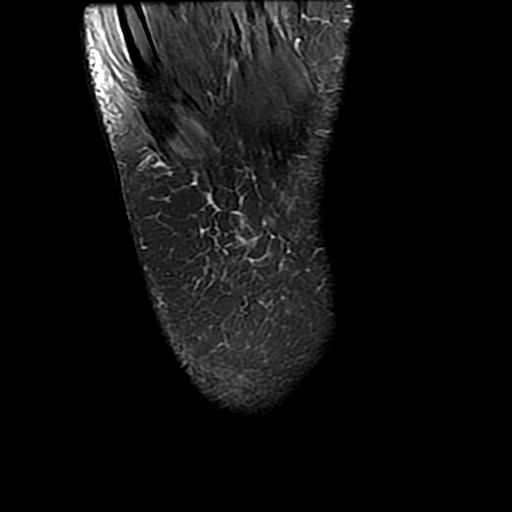
[im 6/44]
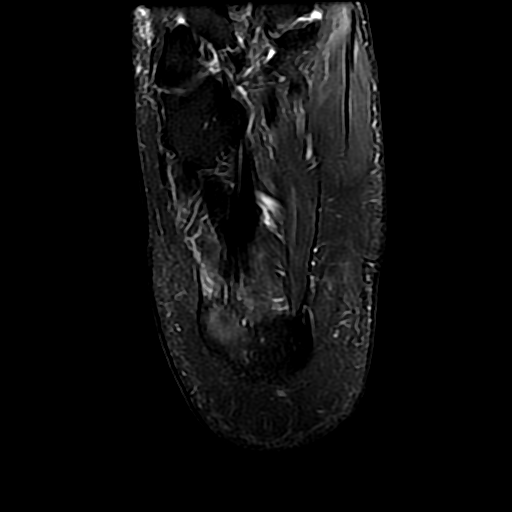
[im 11/44]
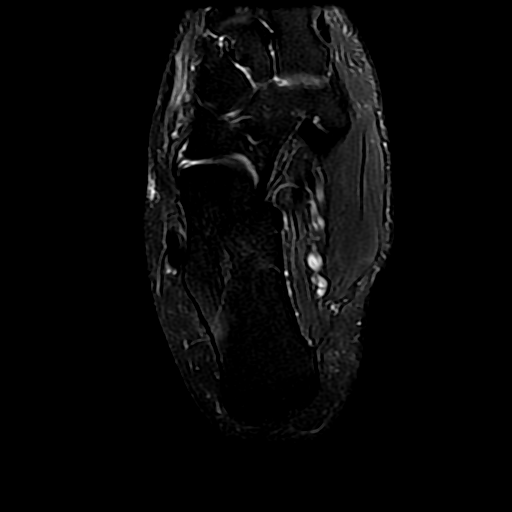
[im 17/44]
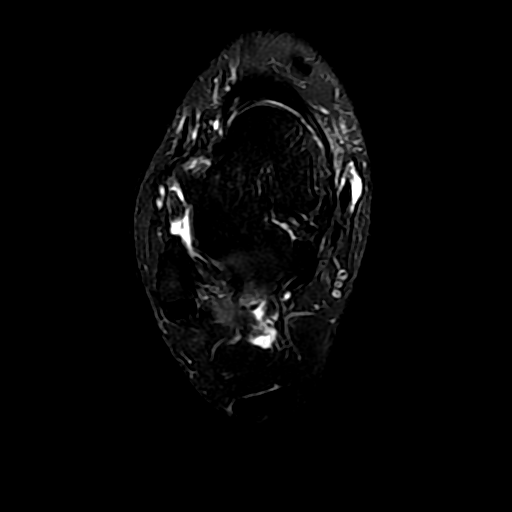
[im 22/44]
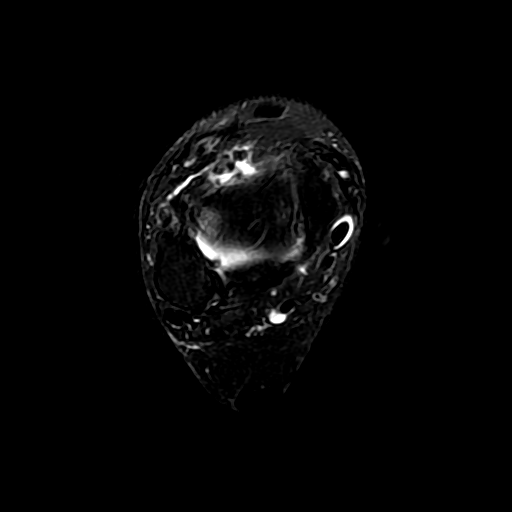
[im 27/44]
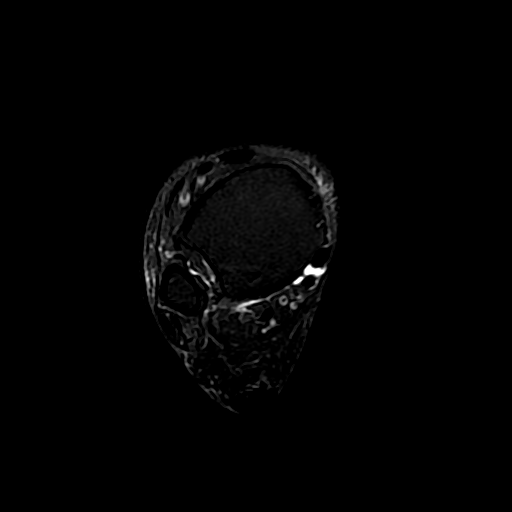
[im 33/44]
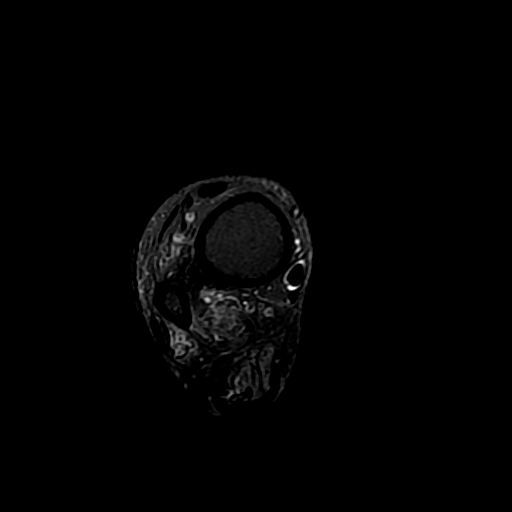
[im 38/44]
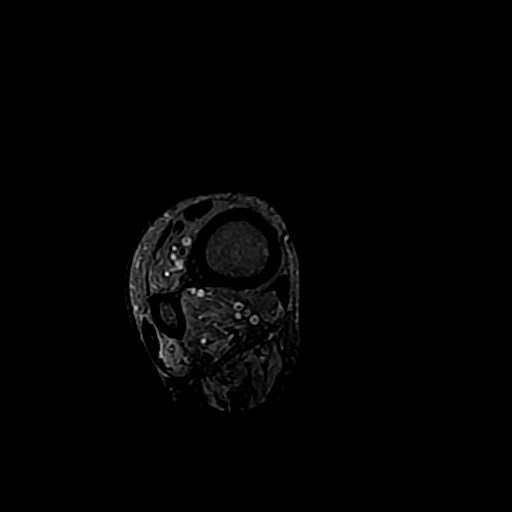
[im 44/44]
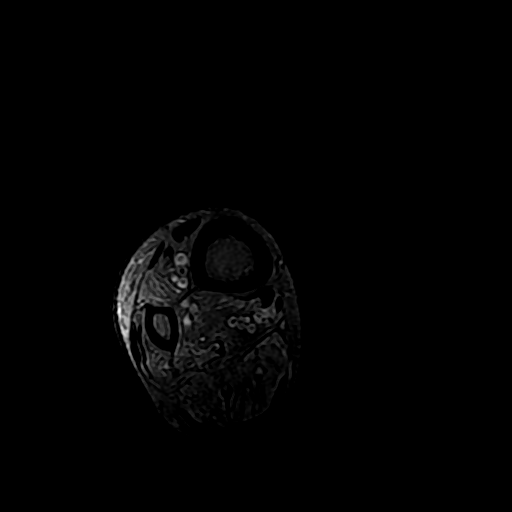

[Series 8: T2 fat-sat · coronal · 3.0mm · 0.31mm/px · 3 of 40 slices shown (2 of 2)]
[im 6/40]
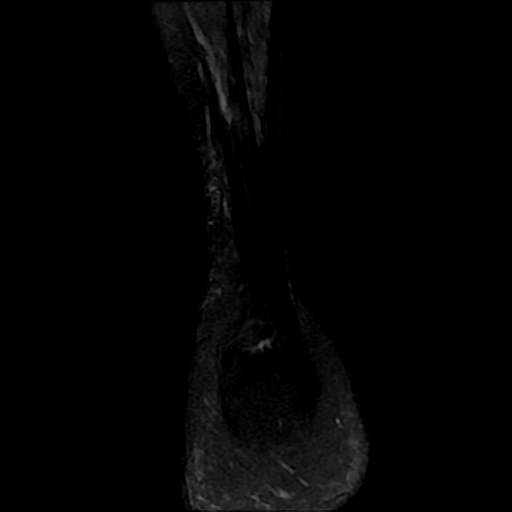
[im 23/40]
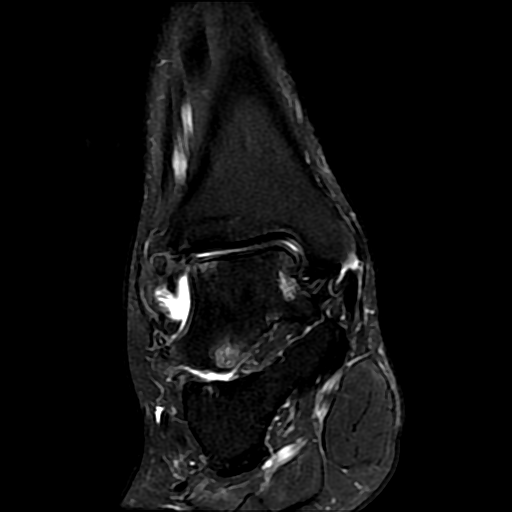
[im 34/40]
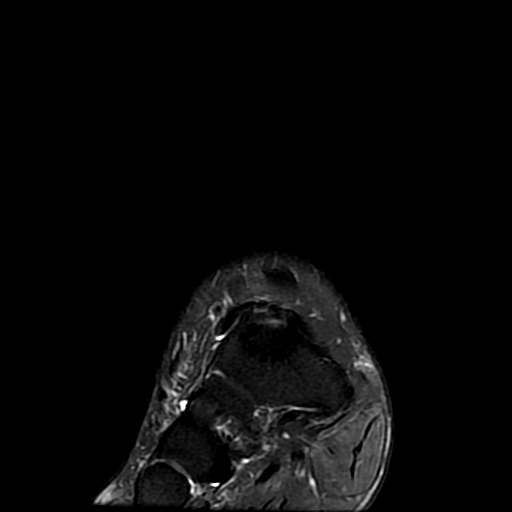

[18 of 40 positions shown; findings below may reference images not displayed]

FINDINGS: TENDONS

Peroneal: Intact.

Posteromedial: Intact.

Anterior: Intact.

Achilles: Intact.

Plantar Fascia: The medial cord is ruptured with secondary marrow
edema in the calcaneus at the medial cord attachment site. There is
some fluid and edema about the site of the rupture consistent with
recent injury.

LIGAMENTS

Lateral: The anterior talofibular ligament is completely torn from
the fibula. The calcaneofibular ligament is also torn from the
fibula. The anterior, inferior tibiofibular ligament is almost
completely torn. The medial fibers of the ligament are thickened.
The posteroinferior tibiofibular ligament appears intact.

Medial: Intact. Small ossifications about the ligament are
consistent with remote sprain.

CARTILAGE

Ankle Joint: The patient has markedly advanced for age tibiotalar
osteoarthritis with some subchondral edema and osteophytosis about
the joint. Loose bodies are seen in the anterior aspect of the joint
measuring up to 0.9 cm in diameter.

Subtalar Joints/Sinus Tarsi: There is some degenerative disease at
the posterior facet of the subtalar joint with a small subchondral
cyst and mild edema seen in the lateral process of the talus.

Bones: No acute bony abnormality. Remote avulsion fracture off the
dorsal margin of the navicular at the talonavicular joint is noted.

Other: None.
IMPRESSION: Complete rupture of the medial cord of the plantar fascia appears
acute or subacute.

Chronic appearing ATFL and calcaneofibular ligament tears.

Remote appearing high-grade sprain of the anterior, inferior
tibiofibular ligament. There appears to be a small band of fibers
intact at the ligament's attachment site to the fibula.

Markedly advanced for age tibiotalar osteoarthritis with loose
bodies in the anterior aspect of the joint measuring up to 0.9 cm.

Mild subtalar osteoarthritis.

Remote appearing avulsion fracture off the dorsal margin of the
navicular at the talonavicular joint.

## 2020-07-16 ENCOUNTER — Ambulatory Visit (INDEPENDENT_AMBULATORY_CARE_PROVIDER_SITE_OTHER): Payer: BC Managed Care – PPO | Admitting: Podiatry

## 2020-07-16 ENCOUNTER — Other Ambulatory Visit: Payer: Self-pay

## 2020-07-16 DIAGNOSIS — M629 Disorder of muscle, unspecified: Secondary | ICD-10-CM

## 2020-07-16 DIAGNOSIS — S86011D Strain of right Achilles tendon, subsequent encounter: Secondary | ICD-10-CM | POA: Diagnosis not present

## 2020-07-21 ENCOUNTER — Other Ambulatory Visit: Payer: Self-pay | Admitting: Podiatry

## 2020-07-21 DIAGNOSIS — S86011A Strain of right Achilles tendon, initial encounter: Secondary | ICD-10-CM

## 2020-07-22 ENCOUNTER — Telehealth: Payer: Self-pay | Admitting: Podiatry

## 2020-07-22 NOTE — Telephone Encounter (Signed)
I am waiting on 07/16/20 notes for Mr. Mark Stein to send to Disability. Thank you

## 2020-07-23 MED ORDER — OXYCODONE-ACETAMINOPHEN 5-325 MG PO TABS: 1.0000 | ORAL_TABLET | ORAL | 0 refills | Status: DC | PRN

## 2020-07-23 NOTE — Telephone Encounter (Signed)
Please advise 

## 2020-07-27 NOTE — Progress Notes (Signed)
  Subjective:  Patient ID: Mark Stein, male    DOB: Oct 21, 1985,  MRN: [id number]  No chief complaint on file.  35 y.o. male presents for f/u.  Still having considerable pain from the ankle.  Having pain in both the bottom and the back of the ankle.  Objective:  Physical Exam: Wound Location: left 1st Met Wound Base:  no true open ulcer but immature epithelial cells Peri-wound: Calloused Exudate: none wound without warmth, erythema, signs of acute infection  Right ankle - decreased muscle strength right. No Achilles dell, tendon does appear intact but thinner distally. 3/5 plantarflexion. Contralateral limb good strength  Assessment:   1. Rupture of right Achilles tendon, subsequent encounter   2. Nontraumatic tear of plantar fascia    Plan:  Patient was evaluated and treated and all questions answered.  Ulcer left hallux -Appears unchanged  Right heel injury -MRI reviewed with patient. Intact Achilles but with acute plantar fascial rupture. Will continue immobilization.  Surgical intervention not indicated at this time but will monitor. -We will prolonged out of work as she will need several weeks to recover from this injury   Return in about 2 weeks (around 07/30/2020) for Tendonitis; plantar fascia rupture .

## 2020-07-29 ENCOUNTER — Telehealth: Payer: Self-pay | Admitting: Podiatry

## 2020-07-29 NOTE — Telephone Encounter (Signed)
Please indicate rtw date on progress notes. Thank you

## 2020-07-30 ENCOUNTER — Ambulatory Visit (INDEPENDENT_AMBULATORY_CARE_PROVIDER_SITE_OTHER): Payer: BC Managed Care – PPO | Admitting: Podiatry

## 2020-07-30 ENCOUNTER — Other Ambulatory Visit: Payer: Self-pay

## 2020-07-30 DIAGNOSIS — M629 Disorder of muscle, unspecified: Secondary | ICD-10-CM

## 2020-07-30 DIAGNOSIS — S86011D Strain of right Achilles tendon, subsequent encounter: Secondary | ICD-10-CM | POA: Diagnosis not present

## 2020-08-05 ENCOUNTER — Telehealth: Payer: Self-pay | Admitting: Podiatry

## 2020-08-05 NOTE — Telephone Encounter (Signed)
Good Morning  I am waiting on notes for patient.  Disability only gives me a small window of time to get notes in, or they stop the patients monies. Sorry for pestering you.

## 2020-08-10 ENCOUNTER — Telehealth: Payer: Self-pay | Admitting: Podiatry

## 2020-08-10 NOTE — Telephone Encounter (Signed)
Dr. Price, please complete ASAP! 

## 2020-08-10 NOTE — Telephone Encounter (Signed)
I am waiting on notes for patient visit on 07/30/2020 for patients disability. Please

## 2020-08-13 ENCOUNTER — Other Ambulatory Visit: Payer: Self-pay

## 2020-08-13 ENCOUNTER — Ambulatory Visit (INDEPENDENT_AMBULATORY_CARE_PROVIDER_SITE_OTHER): Payer: BC Managed Care – PPO | Admitting: Podiatry

## 2020-08-13 DIAGNOSIS — M629 Disorder of muscle, unspecified: Secondary | ICD-10-CM | POA: Diagnosis not present

## 2020-08-13 DIAGNOSIS — S86011D Strain of right Achilles tendon, subsequent encounter: Secondary | ICD-10-CM

## 2020-08-17 ENCOUNTER — Other Ambulatory Visit: Payer: Self-pay | Admitting: Podiatry

## 2020-08-17 NOTE — Progress Notes (Signed)
  Subjective:  Patient ID: Mark Stein, male    DOB: 08/01/1985,  MRN: [id number]  Chief Complaint  Patient presents with  . Plantar Fasciitis     2 weeks follow up    for Tendonitis; plantar fascia rupture   35 y.o. male presents for f/u.  Still having pain but does think it is improving somewhat.  Objective:  Physical Exam: Wound Location: left 1st Met Wound Base:  no true open ulcer but immature epithelial cells Peri-wound: Calloused Exudate: none wound without warmth, erythema, signs of acute infection  Right ankle - decreased muscle strength right.  Pain palpation about plantar heel without pain to the posterior heel. 3/5 plantarflexion. Contralateral limb good strength  Assessment:   1. Rupture of right Achilles tendon, subsequent encounter   2. Nontraumatic tear of plantar fascia    Plan:  Patient was evaluated and treated and all questions answered.  Ulcer left hallux -Appears unchanged  Right heel injury -Improving somewhat.  Continue immobilization in Tri-Lock brace.  This will likely take 6 weeks to heal due to plantar fascial rupture   No follow-ups on file.

## 2020-08-17 NOTE — Progress Notes (Signed)
  Subjective:  Patient ID: Mark Stein, male    DOB: [birth year],  MRN: [id number]  Chief Complaint  Patient presents with  . Wound Check    Left 1st toe wound check. Pt states he is having some occasional blood drainage, some yesterday but none today. Denies any new concerns.  . Ankle Injury    Tendon tear follow up. Pt states still experiencing pain in the heel, denies any new concerns. Pt is in his tri-lock today.   35 y.o. male presents for f/u.  History above confirmed patient Objective:  Physical Exam: Wound Location: left 1st Met Wound Base:  no true open ulcer but immature epithelial cells Peri-wound: Calloused Exudate: none wound without warmth, erythema, signs of acute infection  Right ankle - decreased muscle strength right. No Achilles dell, tendon does appear intact but thinner distally. 3/5 plantarflexion. Contralateral limb good strength  Assessment:   1. Rupture of right Achilles tendon, subsequent encounter   2. Nontraumatic tear of plantar fascia    Plan:  Patient was evaluated and treated and all questions answered.  Ulcer left hallux -Appears unchanged  Right heel injury -I think he would benefit from better immobilization in the cam boot.  I do the Tri-Lock may not sufficiently immobilize his Achilles tendon and plantar fascia.  He is now having more pain in the posterior and plantar heel.  If issues persist will consider ultrasound of the Achilles tendon for continued pain at this area despite negative MRI.  We did discuss that the cam boot may be difficult to walk in given his unique gait. -We will prolong out of work and plan for return in approximately 1 month after aggressive immobilization should symptom resolve  No follow-ups on file.

## 2020-08-18 MED ORDER — OXYCODONE-ACETAMINOPHEN 5-325 MG PO TABS: 1.0000 | ORAL_TABLET | ORAL | 0 refills | Status: DC | PRN

## 2020-08-18 NOTE — Telephone Encounter (Signed)
Please advise 

## 2020-09-03 ENCOUNTER — Ambulatory Visit: Payer: BC Managed Care – PPO | Admitting: Podiatry

## 2020-09-07 ENCOUNTER — Other Ambulatory Visit: Payer: Self-pay

## 2020-09-07 ENCOUNTER — Ambulatory Visit (INDEPENDENT_AMBULATORY_CARE_PROVIDER_SITE_OTHER): Payer: BC Managed Care – PPO | Admitting: Podiatry

## 2020-09-07 ENCOUNTER — Encounter: Payer: Self-pay | Admitting: Podiatry

## 2020-09-07 DIAGNOSIS — M629 Disorder of muscle, unspecified: Secondary | ICD-10-CM | POA: Diagnosis not present

## 2020-09-07 DIAGNOSIS — S86011D Strain of right Achilles tendon, subsequent encounter: Secondary | ICD-10-CM

## 2020-09-07 NOTE — Progress Notes (Signed)
  Subjective:  Patient ID: Mark Stein, male    DOB: 06/02/1985,  MRN: [id number]  Chief Complaint  Patient presents with  . Wound Check    3 week follow up wound check. Pt states he is doing well and is walking much better.    35 y.o. male presents for f/u.  History above confirmed patient Objective:  Physical Exam: Wound Location: left 1st Met Wound Base:  no true open ulcer but immature epithelial cells Peri-wound: Calloused Exudate: none wound without warmth, erythema, signs of acute infection  Right ankle - decreased muscle strength right. No Achilles dell, tendon does appear intact but thinner distally. 3/5 plantarflexion. Contralateral limb good strength. No POP achilles or plantar fascia  Assessment:   1. Rupture of right Achilles tendon, subsequent encounter   2. Nontraumatic tear of plantar fascia    Plan:  Patient was evaluated and treated and all questions answered.  Ulcer left hallux -Appears unchanged. No debridement today. Per pt has not opened back up.  Right heel injury -At this point we can transition out of his boot. Will allow him 2 weeks to transition to normal shoegear for safe return to work. I do think he should wear his brace at times so he does not get too much pressure on the tendons. He does admit that after 30 minutes the ankle gets red and hot, likely 2/2 edema. -F/u in 1 month to make sure the tendon/ligament are improved.  Return in about 1 month (around 10/08/2020) for Achilles and plantar fasciitis tear f/u.

## 2020-09-21 ENCOUNTER — Telehealth: Payer: Self-pay | Admitting: Podiatry

## 2020-09-21 NOTE — Telephone Encounter (Signed)
Mark Stein said that you released him to rtw on 09/21/2020, but he didn't go in because he is in too  Much pain. Patient said that he wouldn't return back to work until after his next appt. To see you.

## 2020-09-21 NOTE — Telephone Encounter (Signed)
OK - thanks for letting me know

## 2020-10-08 ENCOUNTER — Ambulatory Visit: Payer: BC Managed Care – PPO | Admitting: Podiatry

## 2020-10-15 ENCOUNTER — Ambulatory Visit (INDEPENDENT_AMBULATORY_CARE_PROVIDER_SITE_OTHER): Payer: BC Managed Care – PPO | Admitting: Podiatry

## 2020-10-15 ENCOUNTER — Other Ambulatory Visit: Payer: Self-pay

## 2020-10-15 DIAGNOSIS — S86011S Strain of right Achilles tendon, sequela: Secondary | ICD-10-CM | POA: Diagnosis not present

## 2020-10-15 DIAGNOSIS — L97522 Non-pressure chronic ulcer of other part of left foot with fat layer exposed: Secondary | ICD-10-CM | POA: Diagnosis not present

## 2020-10-15 DIAGNOSIS — M7661 Achilles tendinitis, right leg: Secondary | ICD-10-CM | POA: Diagnosis not present

## 2020-10-15 NOTE — Progress Notes (Signed)
  Subjective:  Patient ID: Mark Stein, male    DOB: 04-29-1985,  MRN: [id number]  Chief Complaint  Patient presents with   Foot Injury    Right achilles tendon rupture: Pt states healing but it is very slow/gradual. Pt still experiences pain with activity. Discuss work.   Wound Check    Left 1st ulcer, pt states he did a soak and a callous came off with some blood drainage.    35 y.o. male presents for f/u.  History above confirmed patient Objective:  Physical Exam: Wound Location: left 1st Met Wound Base:  no true open ulcer but immature epithelial cells Peri-wound: Calloused Exudate: none wound without warmth, erythema, signs of acute infection  Right ankle - decreased muscle strength right. 4/5 Achilles strength right. Contralateral limb good strength. No POP achilles or plantar fascia  Assessment:   1. Rupture of right Achilles tendon, sequela   2. Skin ulcer of left foot with fat layer exposed ([place])   3. Achilles tendinitis of right lower extremity    Plan:  Patient was evaluated and treated and all questions answered.  Ulcer left hallux -Does appear to have re-ulcerated today. Dressed with silvadene and band-aid after debridement.  Right heel injury -He is improving but not able to tolerate excessive activity. I think this will preclude him working and he would benefit from additional time for recovery. -Night splint dispensed for continued stretching of the Achilles tendon to promote resolution of tendonitis.   Return in about 5 weeks (around 11/19/2020) for Tendonitis f/u.

## 2020-10-27 ENCOUNTER — Other Ambulatory Visit: Payer: Self-pay | Admitting: Podiatry

## 2020-10-28 MED ORDER — OXYCODONE-ACETAMINOPHEN 5-325 MG PO TABS: 1.0000 | ORAL_TABLET | ORAL | 0 refills | Status: AC | PRN

## 2020-10-28 NOTE — Telephone Encounter (Signed)
Please advise 

## 2020-11-12 ENCOUNTER — Other Ambulatory Visit: Payer: Self-pay

## 2020-11-12 ENCOUNTER — Encounter: Payer: Self-pay | Admitting: Podiatry

## 2020-11-12 ENCOUNTER — Ambulatory Visit (INDEPENDENT_AMBULATORY_CARE_PROVIDER_SITE_OTHER): Payer: BC Managed Care – PPO | Admitting: Podiatry

## 2020-11-12 DIAGNOSIS — M7661 Achilles tendinitis, right leg: Secondary | ICD-10-CM

## 2020-11-12 DIAGNOSIS — L97522 Non-pressure chronic ulcer of other part of left foot with fat layer exposed: Secondary | ICD-10-CM

## 2020-11-17 ENCOUNTER — Telehealth: Payer: Self-pay | Admitting: Podiatry

## 2020-11-17 NOTE — Telephone Encounter (Signed)
I am holding for office notes for 7/22 visit. Thank you

## 2020-11-19 NOTE — Progress Notes (Signed)
  Subjective:  Patient ID: Mark Stein, male    DOB: Dec 16, 1985,  MRN: [id number]  Chief Complaint  Patient presents with   Tendonitis    F/U Rt tendonitis -pt states,;' better with the splint I can tell its helping; 4-9/10." - mop swelling Tx: NS and brace   Wound Check    F/U LT hallux -pt states," no draiange, but there's an odor,." - w scabbed - no warmth/redness/swerlling tx: silvadene   35 y.o. male presents for f/u.  History above confirmed patient Objective:  Physical Exam: Wound Location: left 1st Met Wound Base: Small pinpoint ulceration  peri-wound: Calloused Exudate: none wound without warmth, erythema, signs of acute infection  Right ankle - decreased muscle strength right. 4/5 Achilles strength right. Contralateral limb good strength. No POP achilles or plantar fascia  Assessment:   1. Skin ulcer of left foot with fat layer exposed ([place])   2. Achilles tendinitis of right lower extremity     Plan:  Patient was evaluated and treated and all questions answered.  Ulcer left hallux -Mildly ulceration is occurred.  This was debrided and dressed with Silvadene and Band-Aid.  Would consider skin excision and closure of the hypertrophic skin.  X-rays next visit to assess for any other bony involvement of the ulceration  Right heel injury -He is doing better but still has severe functional imitations.  Does think that the splint is helping and also states help with his dropfoot.  Return to work will be difficult given the demands of his job and he does not feel ready to go back.  Continue night splint for stretching the Achilles tendon and reassess in 3 weeks for improvement of tendinitis   No follow-ups on file.

## 2020-11-19 NOTE — Telephone Encounter (Signed)
Complete

## 2020-12-14 ENCOUNTER — Other Ambulatory Visit: Payer: Self-pay

## 2020-12-14 ENCOUNTER — Encounter: Payer: Self-pay | Admitting: Podiatry

## 2020-12-14 ENCOUNTER — Ambulatory Visit (INDEPENDENT_AMBULATORY_CARE_PROVIDER_SITE_OTHER): Payer: BC Managed Care – PPO

## 2020-12-14 ENCOUNTER — Ambulatory Visit (INDEPENDENT_AMBULATORY_CARE_PROVIDER_SITE_OTHER): Payer: BC Managed Care – PPO | Admitting: Podiatry

## 2020-12-14 DIAGNOSIS — M7661 Achilles tendinitis, right leg: Secondary | ICD-10-CM

## 2020-12-14 DIAGNOSIS — L97522 Non-pressure chronic ulcer of other part of left foot with fat layer exposed: Secondary | ICD-10-CM

## 2020-12-14 DIAGNOSIS — M21371 Foot drop, right foot: Secondary | ICD-10-CM | POA: Diagnosis not present

## 2020-12-14 DIAGNOSIS — M21372 Foot drop, left foot: Secondary | ICD-10-CM | POA: Diagnosis not present

## 2020-12-14 NOTE — Progress Notes (Signed)
  Subjective:  Patient ID: Mark Stein, male    DOB: 1985/11/15,  MRN: [id number]  Chief Complaint  Patient presents with   Foot Ulcer    Left foot skin ulcer Pt stated that he is doing okay he stated that he does have some pain with his right foot by the end of the day    35 y.o. male presents for f/u.  History above confirmed patient Objective:  Physical Exam: Wound Location: left 1st Met Wound Base: Small pinpoint ulceration  peri-wound: Calloused Exudate: none wound without warmth, erythema, signs of acute infection  Right ankle - decreased muscle strength right. 4/5 Achilles strength right. Contralateral limb good strength. Mild POP achilles watershed are, no pain at the plantar fascia  Assessment:   1. Skin ulcer of left foot with fat layer exposed ([place])   2. Achilles tendinitis of right lower extremity   3. Bilateral foot-drop     Plan:  Patient was evaluated and treated and all questions answered.  Ulcer left hallux -Mild recurrence of ulcer. New XR today show possible underlying bone without sign of OM. Could consider further bone excision at a later date.  Right heel injury -He is doing better but having progressive pain throughout the day. We discussed limiting the amount of hours worked per day in order to help him recover from this, patient would like to proceed. Will limit to 6 hours a day. Refer to Pt for strengthening and help with reducing inflammation of the Achilles tendon. Could also work on [employer] strength that could help with dropfoot.   Return in about 6 weeks (around 01/25/2021).

## 2020-12-17 ENCOUNTER — Other Ambulatory Visit: Payer: Self-pay | Admitting: Podiatry

## 2020-12-17 ENCOUNTER — Encounter: Payer: Self-pay | Admitting: Podiatry

## 2020-12-20 NOTE — Telephone Encounter (Signed)
Please advise. Dr. Samuella Cota is on vacation. Thanks

## 2020-12-29 ENCOUNTER — Ambulatory Visit: Payer: BC Managed Care – PPO | Attending: Podiatry | Admitting: Physical Therapy

## 2021-01-25 ENCOUNTER — Ambulatory Visit: Payer: BC Managed Care – PPO | Admitting: Podiatry

## 2021-01-25 ENCOUNTER — Other Ambulatory Visit: Payer: Self-pay

## 2021-01-25 ENCOUNTER — Ambulatory Visit (INDEPENDENT_AMBULATORY_CARE_PROVIDER_SITE_OTHER): Payer: BC Managed Care – PPO | Admitting: Podiatry

## 2021-01-25 DIAGNOSIS — L97522 Non-pressure chronic ulcer of other part of left foot with fat layer exposed: Secondary | ICD-10-CM | POA: Diagnosis not present

## 2021-01-25 DIAGNOSIS — M7661 Achilles tendinitis, right leg: Secondary | ICD-10-CM | POA: Diagnosis not present

## 2021-01-25 NOTE — Patient Instructions (Signed)
Call to set up your appointment:   [facility] at [facility] [phone]  [address] [place], [place] [address]

## 2021-01-27 ENCOUNTER — Telehealth: Payer: Self-pay | Admitting: Podiatry

## 2021-01-27 NOTE — Telephone Encounter (Signed)
Good Morning,   Could you be a little more detailed on Mark Stein progress note, of why he is still out of work. It has to go to his disability company and they will need information like  Return to work status, treatment plan, complaint and Wound progression.

## 2021-02-01 NOTE — Progress Notes (Signed)
  Subjective:  Patient ID: Mark Stein, male    DOB: Sep 06, 1985,  MRN: [id number]  Chief Complaint  Patient presents with   Foot Ulcer    Follow up left foot skin ulcer. Pt states a callus came off in the shower 2 days ago. Pt states he has some good and bad days not much difference. Pt states on 9/21 he tried going to work and he was sent home.    35 y.o. male presents for f/u.  History above confirmed patient Objective:  Physical Exam: Wound Location: left 1st Met Wound Base: Small pinpoint ulceration  peri-wound: Calloused Exudate: none wound without warmth, erythema, signs of acute infection  Right ankle - decreased muscle strength right. 4/5 Achilles strength right. Contralateral limb good strength. Mild POP achilles watershed are, no pain at the plantar fascia  Assessment:   1. Skin ulcer of left foot with fat layer exposed ([place])   2. Achilles tendinitis of right lower extremity     Plan:  Patient was evaluated and treated and all questions answered.  Ulcer left hallux -Mild recurrence of ulcer. Debrided again, no deep exposure. Dressed with silvadene and bandage. Continue daily.   Right heel injury -Still having pain throughout the day, limiting his ability to walk. Advised he start PT that was previously ordered. Given contact number to set up appt. Will contionue [facility] x6 weeks.   Return in about 4 weeks (around 02/22/2021) for wound check, tendonitis f/u.

## 2021-02-02 ENCOUNTER — Other Ambulatory Visit: Payer: Self-pay

## 2021-02-02 ENCOUNTER — Ambulatory Visit: Payer: BC Managed Care – PPO | Attending: Podiatry | Admitting: Physical Therapy

## 2021-02-02 ENCOUNTER — Ambulatory Visit: Payer: BC Managed Care – PPO

## 2021-02-02 ENCOUNTER — Encounter: Payer: Self-pay | Admitting: Physical Therapy

## 2021-02-02 DIAGNOSIS — M6281 Muscle weakness (generalized): Secondary | ICD-10-CM

## 2021-02-02 DIAGNOSIS — R2689 Other abnormalities of gait and mobility: Secondary | ICD-10-CM | POA: Diagnosis not present

## 2021-02-02 DIAGNOSIS — M25571 Pain in right ankle and joints of right foot: Secondary | ICD-10-CM | POA: Diagnosis not present

## 2021-02-02 NOTE — Patient Instructions (Signed)
Access Code: [email or web address] URL: [email or web address] Date: 02/02/2021 Prepared by: Rosana Hoes  Exercises Long Sitting Calf Stretch with Strap - 2-3 x daily - 7 x weekly - 3 reps - 30 seconds hold Long Sitting Ankle Plantar Flexion with Resistance - 1 x daily - 7 x weekly - 3 sets - 20 reps Seated Heel Raise - 1 x daily - 7 x weekly - 3 sets - 20 reps

## 2021-02-02 NOTE — Therapy (Signed)
[facility] Outpatient Rehabilitation [facility] [address] [place], [place], [address] Phone: [phone]   Fax:  [phone]  Physical Therapy Evaluation  Patient Details  Name: Mark Stein MRN: [id number] Date of Birth: [birth year] Referring Provider (PT): Park Liter, DPM   Encounter Date: 02/02/2021   PT End of Session - 02/02/21 1422     Visit Number 1    Number of Visits 6    Date for PT Re-Evaluation 03/16/21    Authorization Type BCBS    PT Start Time 1410    PT Stop Time 1445    PT Time Calculation (min) 35 min    Activity Tolerance Patient tolerated treatment well    Behavior During Therapy [facility] for tasks assessed/performed             Past Medical History:  Diagnosis Date   Medical history non-contributory     Past Surgical History:  Procedure Laterality Date   EXCISION PARTIAL PHALANX Right 10/16/2018   Procedure: right great toe wound debridement and closure. arthroplasty packing antiobiotic beads. distal phlax chondrolectomy . bone biopsy.;  Surgeon: Park Liter, DPM;  Location: [facility];  Service: Podiatry;  Laterality: Right;   HAMMER TOE SURGERY Left 02/05/2019   Procedure: HAMMER TOE CORRECTION BIG TOE;  Surgeon: Park Liter, DPM;  Location: [facility];  Service: Podiatry;  Laterality: Left;   I & D EXTREMITY Left 11/07/2019   Procedure: IRRIGATION AND DEBRIDEMENT LEFT GREAT TOE WITH BONE BIOPSY, INSERTION OF ANTIBIOTIC BEADS;  Surgeon: Park Liter, DPM;  Location: [facility];  Service: Podiatry;  Laterality: Left;   MASS EXCISION Left 02/05/2019   Procedure: EXCISION BENIGN LESION 2.1-3.1CM;  Surgeon: Park Liter, DPM;  Location: [facility];  Service: Podiatry;  Laterality: Left;   NO PAST SURGERIES     TENDON TRANSFER Left 02/05/2019   Procedure: ADJACENT TISSUE TRANSER LEFT;  Surgeon: Park Liter, DPM;  Location: [facility];  Service: Podiatry;  Laterality: Left;   WOUND DEBRIDEMENT Left 11/21/2019   Procedure: LEFT  FOOT  DEBRIDEMENT WOUND WITH  CLOSURE AND REMOVAL REINSERTION OF NON BIODEGRADABLE DRUG DELIVERY IMPLANT;  Surgeon: Park Liter, DPM;  Location: [facility];  Service: Podiatry;  Laterality: Left;    There were no vitals filed for this visit.    Subjective Assessment - 02/02/21 1411     Subjective Patient states in 2017 he developed a chronic ulcer on the left foot, bilateral drop foot since 2019 that could be do bulging discs, and now recently in February he tore his achilles. He does get a burning sensation in his right achilles, and he states the more he walks the worse it gets. States currently he can walk approximately 2-3 minutes currently, where he could walk for about 10 minutes before the achilles injury.    Limitations Lifting;Standing;House hold activities;Walking    How long can you walk comfortably? 2-3 minutes    Patient Stated Goals Get pain relief and improve walking    Currently in Pain? Yes    Pain Score 4    8/10 while walking   Pain Location Ankle    Pain Orientation Right    Pain Descriptors / Indicators Burning    Pain Type Chronic pain    Pain Onset More than a month ago    Pain Frequency Intermittent    Aggravating Factors  Standing, walking, squatting, lifting    Pain Relieving Factors Wearing brace  [facility] PT Assessment - 02/02/21 0001       Assessment   Medical Diagnosis Achilles tendinitis of right lower extremity    Referring Provider (PT) Park Liter, DPM    Onset Date/Surgical Date --   Pennie Rushing 2022   Prior Therapy No      Precautions   Precautions Fall    Precaution Comments mulitple falls due to drop foot      Restrictions   Weight Bearing Restrictions No      Balance Screen   Has the patient fallen in the past 6 months Yes    How many times? at least 10 times    Has the patient had a decrease in activity level because of a fear of falling?  No    Is the patient reluctant to leave their home because of a fear of  falling?  No      Prior Function   Level of Independence Independent    Vocation Requirements Currently out from work      Cognition   Overall Cognitive Status Within Functional Limits for tasks assessed      Observation/Other Assessments   Observations Patient appears in no apparent distress    Focus on Therapeutic Outcomes (FOTO)  29% functional status      Sensation   Light Touch Impaired by gross assessment    Additional Comments patient reports sensation deficit bilateral lower leg and foot      Coordination   Gross Motor Movements are Fluid and Coordinated No      Functional Tests   Functional tests Squat;Single leg stance      Squat   Comments Unable without UE support      Single Leg Stance   Comments Unable without UE support      ROM / Strength   AROM / PROM / Strength AROM;Strength;PROM      AROM   AROM Assessment Site Ankle    Right/Left Ankle Right;Left    Right Ankle Dorsiflexion --   unable   Right Ankle Plantar Flexion 50    Right Ankle Inversion 5    Right Ankle Eversion 5    Left Ankle Dorsiflexion --   unable   Left Ankle Plantar Flexion 60    Left Ankle Inversion 5    Left Ankle Eversion 5      PROM   PROM Assessment Site Ankle    Right/Left Ankle Right;Left    Right Ankle Dorsiflexion 0    Left Ankle Dorsiflexion 5      Strength   Strength Assessment Site Ankle    Right/Left Ankle Right;Left    Right Ankle Dorsiflexion 1/5    Right Ankle Plantar Flexion 2/5    Right Ankle Inversion 1/5    Right Ankle Eversion 1/5    Left Ankle Dorsiflexion 1/5    Left Ankle Plantar Flexion 3-/5    Left Ankle Inversion 1/5    Left Ankle Eversion 1/5      Palpation   Palpation comment TTP midsubstance achilles on right      Ambulation/Gait   Ambulation/Gait Yes    Ambulation/Gait Assistance 6: Modified independent (Device/Increase time)    Gait Comments Steppage gait bilaterally                        Objective measurements  completed on examination: See above findings.       [facility] Adult PT Treatment/Exercise - 02/02/21 0001  Exercises   Exercises Ankle      Ankle Exercises: Stretches   Gastroc Stretch 3 reps;30 seconds    Gastroc Stretch Limitations supine with strap      Ankle Exercises: Seated   Heel Raises 20 reps      Ankle Exercises: Supine   T-Band Longsitting ankle PF with red x 20 bilat                     PT Education - 02/02/21 1422     Education Details Exam findings, POC, HEP    Person(s) Educated Patient    Methods Explanation;Demonstration;Tactile cues;Verbal cues;Handout    Comprehension Verbalized understanding;Returned demonstration;Verbal cues required;Tactile cues required;Need further instruction              PT Short Term Goals - 02/02/21 1448       PT SHORT TERM GOAL #1   Title STG = LTG               PT Long Term Goals - 02/02/21 1448       PT LONG TERM GOAL #1   Title Patient will be I with advanced HEP to progress strength and walking ability    Time 6    Period Weeks    Status New    Target Date 03/16/21      PT LONG TERM GOAL #2   Title Patient will demonstrate >/= 3/5 plantarflexion strength in order to improve walking ability    Time 6    Period Weeks    Status New    Target Date 03/16/21      PT LONG TERM GOAL #3   Title Patient will exhibit >/= 8 deg dorsiflexion PROM to improve lifting and squatting ability    Time 6    Period Weeks    Status New    Target Date 03/16/21      PT LONG TERM GOAL #4   Title Patient will report ability to ambulate >/= 10 minutes with </= 3/10 pain in order to reduce functional limitation and return to work    Time 6    Period Weeks    Status New    Target Date 03/16/21                    Plan - 02/02/21 1455     Clinical Impression Statement Patient presents to PT with right achilles pain following injury in February 2022. He has multiple complaints regarding bilateral  feet including left foot ulcer and bilateral foot drop from previous injury. Patient exhibits decreased calf flexibility and strength with increased pain on right achilles region. He ambulates using bilateral steppage gait with no toe off. Patient was provided exercises to initiate calf flexibility and strengthening program. Patient would benefit from continued skilled PT to progress ankle mobility and strength in order to reduce pain and improve walking ability, it is unclear how much progress patient will make regarding drop foot but will trial modalities to improve muscular activation.    Personal Factors and Comorbidities Fitness;Past/Current Experience;Time since onset of injury/illness/exacerbation;Comorbidity 3+    Comorbidities chronic drop foot, ulcer left foot, BMI, lumbar radiculopathy    Examination-Activity Limitations Locomotion Level;Squat;Stairs;Stand;Lift    Examination-Participation Restrictions Meal Prep;Cleaning;Occupation;Community Activity;Shop;Yard Work    Conservation officer, historic buildings Evolving/Moderate complexity    Clinical Decision Making Moderate    Rehab Potential Fair    PT Frequency 1x / week    PT Duration 6 weeks  PT Treatment/Interventions ADLs/Self Care Home Management;Aquatic Therapy;Cryotherapy;Electrical Stimulation;Biofeedback;Iontophoresis 4mg /ml Dexamethasone;Moist Heat;Traction;Ultrasound;Neuromuscular re-education;Balance training;Therapeutic exercise;DME Instruction;Therapeutic activities;Functional mobility training;Stair training;Gait training;Patient/family education;Manual techniques;Dry needling;Passive range of motion;Taping;Vasopneumatic Device;Spinal Manipulations;Joint Manipulations    PT Next Visit Plan Review HEP and progress PRN, further LE strength assessment, progress calf flexibility and strength, NMES for foot drop    PT Home Exercise Plan [id number]    Consulted and Agree with Plan of Care Patient             Patient will benefit  from skilled therapeutic intervention in order to improve the following deficits and impairments:  Abnormal gait, Decreased range of motion, Difficulty walking, Pain, Decreased activity tolerance, Decreased strength, Impaired sensation, Decreased balance, Impaired flexibility, Improper body mechanics  Visit Diagnosis: Pain in right ankle and joints of right foot  Muscle weakness (generalized)  Other abnormalities of gait and mobility     Problem List Patient Active Problem List   Diagnosis Date Noted   Hammer toes, bilateral 12/05/2019   Foot drop, bilateral 12/05/2019   Morbid obesity (HCC) 12/05/2019   Normocytic anemia 12/05/2019   Lumbar radiculopathy 12/04/2019   Planned postoperative wound closure    Osteomyelitis of foot, left, acute (HCC)    Ulcer of great toe, left, with necrosis of bone (HCC)     02/03/2020, PT, DPT, LAT, ATC 02/02/21  3:12 PM Phone: [phone] Fax: [phone]   [facility] Outpatient Rehabilitation [facility] [address] [place], [place], [place] Phone: [phone]   Fax:  [phone]  Name: Mark Stein MRN: [name] Date of Birth: 1986-04-11

## 2021-02-16 ENCOUNTER — Ambulatory Visit: Payer: BC Managed Care – PPO | Admitting: Physical Therapy

## 2021-02-16 ENCOUNTER — Encounter: Payer: Self-pay | Admitting: Physical Therapy

## 2021-02-16 ENCOUNTER — Other Ambulatory Visit: Payer: Self-pay

## 2021-02-16 DIAGNOSIS — M25571 Pain in right ankle and joints of right foot: Secondary | ICD-10-CM | POA: Diagnosis not present

## 2021-02-16 DIAGNOSIS — M6281 Muscle weakness (generalized): Secondary | ICD-10-CM | POA: Diagnosis not present

## 2021-02-16 DIAGNOSIS — R2689 Other abnormalities of gait and mobility: Secondary | ICD-10-CM | POA: Diagnosis not present

## 2021-02-16 NOTE — Therapy (Signed)
[facility] Outpatient Rehabilitation [facility] [address] [place], [place], [address] Phone: [phone]   Fax:  [phone]  Physical Therapy Treatment  Patient Details  Name: Mark Stein MRN: [id number] Date of Birth: 11-20-1985 Referring Provider (PT): Park Liter, DPM   Encounter Date: 02/16/2021   PT End of Session - 02/16/21 1111     Visit Number 2    Number of Visits 6    Date for PT Re-Evaluation 03/16/21    Authorization Type BCBS    PT Start Time 1049    PT Stop Time 1133   5 min TPDN performed not billed time   PT Time Calculation (min) 44 min    Activity Tolerance Patient tolerated treatment well    Behavior During Therapy [facility] for tasks assessed/performed             Past Medical History:  Diagnosis Date   Medical history non-contributory     Past Surgical History:  Procedure Laterality Date   EXCISION PARTIAL PHALANX Right 10/16/2018   Procedure: right great toe wound debridement and closure. arthroplasty packing antiobiotic beads. distal phlax chondrolectomy . bone biopsy.;  Surgeon: Park Liter, DPM;  [facility]: [facility];  Service: Podiatry;  Laterality: Right;   HAMMER TOE SURGERY Left 02/05/2019   Procedure: HAMMER TOE CORRECTION BIG TOE;  Surgeon: Park Liter, DPM;  Location: [facility];  Service: Podiatry;  Laterality: Left;   I & D EXTREMITY Left 11/07/2019   Procedure: IRRIGATION AND DEBRIDEMENT LEFT GREAT TOE WITH BONE BIOPSY, INSERTION OF ANTIBIOTIC BEADS;  Surgeon: Park Liter, DPM;  [facility]: [facility];  Service: Podiatry;  Laterality: Left;   MASS EXCISION Left 02/05/2019   Procedure: EXCISION BENIGN LESION 2.1-3.1CM;  Surgeon: Park Liter, DPM;  Location: [facility];  Service: Podiatry;  Laterality: Left;   NO PAST SURGERIES     TENDON TRANSFER Left 02/05/2019   Procedure: ADJACENT TISSUE TRANSER LEFT;  Surgeon: Park Liter, DPM;  [facility];  Service: Podiatry;  Laterality: Left;   WOUND  DEBRIDEMENT Left 11/21/2019   Procedure: LEFT FOOT  DEBRIDEMENT WOUND WITH  CLOSURE AND REMOVAL REINSERTION OF NON BIODEGRADABLE DRUG DELIVERY IMPLANT;  Surgeon: Park Liter, DPM;  Location: [facility];  Service: Podiatry;  Laterality: Left;    There were no vitals filed for this visit.   Subjective Assessment - 02/16/21 1054     Subjective Patient reports he can walk a little longer, maybe about 3 minutes before sitting down. He states that the drop foot all together limits him the most and places more pressure on the achilles.    How long can you walk comfortably? 3 minutes    Patient Stated Goals Get pain relief and improve walking    Currently in Pain? Yes    Pain Score 4     Pain Location Ankle    Pain Orientation Right    Pain Descriptors / Indicators Burning    Pain Type Chronic pain    Pain Onset More than a month ago    Pain Frequency Intermittent    Aggravating Factors  Standing, walking, squatting, lifting                [facility] PT Assessment - 02/16/21 0001       PROM   Right Ankle Dorsiflexion 2      Strength   Right Ankle Plantar Flexion 2/5      Palpation   Palpation comment TTP right lateral  gastroc with pain referral to achilles                   [facility] Adult PT Treatment/Exercise:  Therapeutic Exercise: Longsitting calf stretch with strap 3 x 30 sec Longistting ankle PF with green 2 x 10 Seated heel raise with 10# 2 x 10  Manual Therapy: STM right gastroc soleus complex  Neuromuscular re-ed: N/A  Therapeutic Activity: N/A  Modalities: Electrical Stimulation Location: anterior tib bilaterally Action: NMES Parameters: time 10 minutes, frequency 50 pps, duration 200us, on:off 10:50, ramp 2, intensity to patient tolerance and/or muscle activation Goals: Muscle activation  Self Care: N/A  Trigger Point Dry Needling Treatment: Pre-treatment instruction: Patient instructed on dry needling rationale, procedures, and possible side  effects including pain during treatment (achy,cramping feeling), bruising, drop of blood, lightheadedness, nausea, sweating. Patient Consent Given: Yes Education handout provided: No Muscles treated: right lateral gastroc  Needle size and number: .30x9mm x 1 Electrical stimulation performed: No Parameters: N/A Treatment response/outcome: Twitch response elicited, Palpable decrease in muscle tension, and improved muscle activation with less pain. Post-treatment instructions: Patient instructed to expect possible mild to moderate muscle soreness later today and/or tomorrow. Patient instructed in methods to reduce muscle soreness and to continue prescribed HEP. If patient was dry needled over the lung field, patient was instructed on signs and symptoms of pneumothorax and, however unlikely, to see immediate medical attention should they occur. Patient was also educated on signs and symptoms of infection and to seek medical attention should they occur. Patient verbalized understanding of these instructions and education.                 PT Education - 02/16/21 1111     Education Details HEP, dry needling, e-stim    Person(s) Educated Patient    Methods Explanation;Demonstration;Verbal cues    Comprehension Verbalized understanding;Returned demonstration;Verbal cues required;Need further instruction              PT Short Term Goals - 02/02/21 1448       PT SHORT TERM GOAL #1   Title STG = LTG               PT Long Term Goals - 02/02/21 1448       PT LONG TERM GOAL #1   Title Patient will be I with advanced HEP to progress strength and walking ability    Time 6    Period Weeks    Status New    Target Date 03/16/21      PT LONG TERM GOAL #2   Title Patient will demonstrate >/= 3/5 plantarflexion strength in order to improve walking ability    Time 6    Period Weeks    Status New    Target Date 03/16/21      PT LONG TERM GOAL #3   Title Patient will  exhibit >/= 8 deg dorsiflexion PROM to improve lifting and squatting ability    Time 6    Period Weeks    Status New    Target Date 03/16/21      PT LONG TERM GOAL #4   Title Patient will report ability to ambulate >/= 10 minutes with </= 3/10 pain in order to reduce functional limitation and return to work    Time 6    Period Weeks    Status New    Target Date 03/16/21  Plan - 02/16/21 1121     Clinical Impression Statement Patient tolerated therapy well with no avderse effects. Performed dry needling for right gastroc as patient demonstrated increased muscular tension and trigger points that were limiting motion and muscle performance. He reported improvement in symptoms and able to provided greater level of resistance with ankle PF post therapy. Trialed NMES for bilat anterior tibs to improve drop foot, able tpo achieve minimal muscle activation but patient continues to exhibt inability to actively dorsiflex bilateral ankles. Patient provided stronger band for calf strengthening and will assess response to treatment next visit. Patient would benefit from continued skilled PT to progress ankle mobility and strength in order to reduce pain and improve walking ability.    PT Treatment/Interventions ADLs/Self Care Home Management;Aquatic Therapy;Cryotherapy;Electrical Stimulation;Biofeedback;Iontophoresis 4mg /ml Dexamethasone;Moist Heat;Traction;Ultrasound;Neuromuscular re-education;Balance training;Therapeutic exercise;DME Instruction;Therapeutic activities;Functional mobility training;Stair training;Gait training;Patient/family education;Manual techniques;Dry needling;Passive range of motion;Taping;Vasopneumatic Device;Spinal Manipulations;Joint Manipulations    PT Next Visit Plan Review HEP and progress PRN, further LE strength assessment, progress calf flexibility and strength, NMES for foot drop    PT Home Exercise Plan [id number]    Consulted and Agree with Plan of  Care Patient             Patient will benefit from skilled therapeutic intervention in order to improve the following deficits and impairments:  Abnormal gait, Decreased range of motion, Difficulty walking, Pain, Decreased activity tolerance, Decreased strength, Impaired sensation, Decreased balance, Impaired flexibility, Improper body mechanics  Visit Diagnosis: Pain in right ankle and joints of right foot  Muscle weakness (generalized)  Other abnormalities of gait and mobility     Problem List Patient Active Problem List   Diagnosis Date Noted   Hammer toes, bilateral 12/05/2019   Foot drop, bilateral 12/05/2019   Morbid obesity (HCC) 12/05/2019   Normocytic anemia 12/05/2019   Lumbar radiculopathy 12/04/2019   Planned postoperative wound closure    Osteomyelitis of foot, left, acute (HCC)    Ulcer of great toe, left, with necrosis of bone (HCC)     02/03/2020, PT, DPT, LAT, ATC 02/16/21  11:57 AM Phone: [phone] Fax: [phone]   [facility] Outpatient Rehabilitation [facility] [address] [place], [place], [place] Phone: [phone]   Fax:  [phone]  Name: Mark Stein MRN: [name] Date of Birth: [birth year]

## 2021-02-22 ENCOUNTER — Ambulatory Visit (INDEPENDENT_AMBULATORY_CARE_PROVIDER_SITE_OTHER): Payer: BC Managed Care – PPO | Admitting: Podiatry

## 2021-02-22 ENCOUNTER — Other Ambulatory Visit: Payer: Self-pay

## 2021-02-22 DIAGNOSIS — M7661 Achilles tendinitis, right leg: Secondary | ICD-10-CM | POA: Diagnosis not present

## 2021-02-22 DIAGNOSIS — L97522 Non-pressure chronic ulcer of other part of left foot with fat layer exposed: Secondary | ICD-10-CM

## 2021-02-23 ENCOUNTER — Other Ambulatory Visit: Payer: Self-pay

## 2021-02-23 ENCOUNTER — Encounter: Payer: Self-pay | Admitting: Physical Therapy

## 2021-02-23 ENCOUNTER — Ambulatory Visit: Payer: BC Managed Care – PPO | Attending: Podiatry | Admitting: Physical Therapy

## 2021-02-23 DIAGNOSIS — R2689 Other abnormalities of gait and mobility: Secondary | ICD-10-CM | POA: Diagnosis not present

## 2021-02-23 DIAGNOSIS — M6281 Muscle weakness (generalized): Secondary | ICD-10-CM | POA: Diagnosis not present

## 2021-02-23 DIAGNOSIS — M25571 Pain in right ankle and joints of right foot: Secondary | ICD-10-CM | POA: Insufficient documentation

## 2021-02-23 NOTE — Therapy (Signed)
[facility] Rehabilitation [facility] [address] [place], [place], [address] Phone: [phone]   Fax:  [phone]  Physical Therapy Treatment  Patient Details  Name: Mark Stein MRN: [id number] Date of Birth: [birth year] Referring Provider (PT): Park Liter, DPM   Encounter Date: 02/23/2021   PT End of Session - 02/23/21 1205     Visit Number 3    Number of Visits 6    Date for PT Re-Evaluation 03/16/21    Authorization Type BCBS    PT Start Time 1130   7 min TPDN not billed   PT Stop Time 1215    PT Time Calculation (min) 45 min    Activity Tolerance Patient tolerated treatment well    Behavior During Therapy [facility] for tasks assessed/performed             Past Medical History:  Diagnosis Date   Medical history non-contributory     Past Surgical History:  Procedure Laterality Date   EXCISION PARTIAL PHALANX Right 10/16/2018   Procedure: right great toe wound debridement and closure. arthroplasty packing antiobiotic beads. distal phlax chondrolectomy . bone biopsy.;  Surgeon: Park Liter, DPM;  Location: [facility];  Service: Podiatry;  Laterality: Right;   HAMMER TOE SURGERY Left 02/05/2019   Procedure: HAMMER TOE CORRECTION BIG TOE;  Surgeon: Park Liter, DPM;  Location: [facility];  Service: Podiatry;  Laterality: Left;   I & D EXTREMITY Left 11/07/2019   Procedure: IRRIGATION AND DEBRIDEMENT LEFT GREAT TOE WITH BONE BIOPSY, INSERTION OF ANTIBIOTIC BEADS;  Surgeon: Park Liter, DPM;  Location: [facility];  Service: Podiatry;  Laterality: Left;   MASS EXCISION Left 02/05/2019   Procedure: EXCISION BENIGN LESION 2.1-3.1CM;  Surgeon: Park Liter, DPM;  Location: [facility];  Service: Podiatry;  Laterality: Left;   NO PAST SURGERIES     TENDON TRANSFER Left 02/05/2019   Procedure: ADJACENT TISSUE TRANSER LEFT;  Surgeon: Park Liter, DPM;  Location: [facility];  Service: Podiatry;  Laterality: Left;   WOUND DEBRIDEMENT Left  11/21/2019   Procedure: LEFT FOOT  DEBRIDEMENT WOUND WITH  CLOSURE AND REMOVAL REINSERTION OF NON BIODEGRADABLE DRUG DELIVERY IMPLANT;  Surgeon: Park Liter, DPM;  Location: [facility];  Service: Podiatry;  Laterality: Left;    There were no vitals filed for this visit.   Subjective Assessment - 02/23/21 1142     Subjective Patient reports he feels like he is able to walk a little more before needing a rest break (4 minutes) and he states that he has more control with driving.    Patient Stated Goals Get pain relief and improve walking    Currently in Pain? Yes    Pain Score 3     Pain Location Ankle    Pain Orientation Right    Pain Descriptors / Indicators Burning    Pain Type Chronic pain    Pain Onset More than a month ago    Pain Frequency Intermittent    Aggravating Factors  Walking                [facility] PT Assessment - 02/23/21 0001       PROM   Right Ankle Dorsiflexion 6                    [facility] Adult PT Treatment/Exercise:   Therapeutic Exercise: Longsitting calf stretch with strap 2 x 30 sec Longistting ankle PF with blue 2 x 10  Manual Therapy: STM bilateral gastroc soleus complex   Neuromuscular re-ed: N/A   Therapeutic Activity: N/A   Modalities: Electrical Stimulation Location: anterior tib bilaterally Action: NMES Parameters: time 10 minutes, frequency 50 pps, duration 200us, on:off 10:50, ramp 2, intensity to patient tolerance and/or muscle activation Goals: Muscle activation   Self Care: N/A   Trigger Point Dry Needling Treatment: Pre-treatment instruction: Patient instructed on dry needling rationale, procedures, and possible side effects including pain during treatment (achy,cramping feeling), bruising, drop of blood, lightheadedness, nausea, sweating. Patient Consent Given: Yes Education handout provided: No Muscles treated: right and left lateral gastroc, right and left soleus Needle size and number: .30x16mm x  2 Electrical stimulation performed: No Parameters: N/A Treatment response/outcome: Twitch response elicited, Palpable decrease in muscle tension, and improved muscle activation with less pain. Post-treatment instructions: Patient instructed to expect possible mild to moderate muscle soreness later today and/or tomorrow. Patient instructed in methods to reduce muscle soreness and to continue prescribed HEP. If patient was dry needled over the lung field, patient was instructed on signs and symptoms of pneumothorax and, however unlikely, to see immediate medical attention should they occur. Patient was also educated on signs and symptoms of infection and to seek medical attention should they occur. Patient verbalized understanding of these instructions and education.                PT Education - 02/23/21 1205     Education Details HEP    Person(s) Educated Patient    Methods Explanation;Demonstration;Verbal cues    Comprehension Verbalized understanding;Returned demonstration;Verbal cues required;Need further instruction              PT Short Term Goals - 02/02/21 1448       PT SHORT TERM GOAL #1   Title STG = LTG               PT Long Term Goals - 02/23/21 1208       PT LONG TERM GOAL #1   Title Patient will be I with advanced HEP to progress strength and walking ability    Baseline progressing    Time 6    Period Weeks    Status On-going    Target Date 03/16/21      PT LONG TERM GOAL #2   Title Patient will demonstrate >/= 3/5 plantarflexion strength in order to improve walking ability    Baseline patient contiues to demonstrate gross calf weakness    Time 6    Period Weeks    Status On-going    Target Date 03/16/21      PT LONG TERM GOAL #3   Title Patient will exhibit >/= 8 deg dorsiflexion PROM to improve lifting and squatting ability    Baseline 6 deg    Time 6    Period Weeks    Status On-going    Target Date 03/16/21      PT LONG TERM  GOAL #4   Title Patient will report ability to ambulate >/= 10 minutes with </= 3/10 pain in order to reduce functional limitation and return to work    Baseline 4 minutes before needing rest break    Time 6    Period Weeks    Status On-going    Target Date 03/16/21                   Plan - 02/23/21 1207     Clinical Impression Statement Patient tolerated therapy well with no avderse  effects. Continued to use TPDN and manual to improve calf flexibility and dorsiflexion range of motion, which did demonstrate improvement this visit. Continued to use e-stim in attempt to improve activation of anterior tib to improve drop foot, however, unclear how much this will improve. Patient able to progress to using blue band for ankle strengthening with good tolerance and reports improvement in walking ability. Patient would benefit from continued skilled PT to progress ankle mobility and strength in order to reduce pain and improve walking ability.    PT Treatment/Interventions ADLs/Self Care Home Management;Aquatic Therapy;Cryotherapy;Electrical Stimulation;Biofeedback;Iontophoresis 4mg /ml Dexamethasone;Moist Heat;Traction;Ultrasound;Neuromuscular re-education;Balance training;Therapeutic exercise;DME Instruction;Therapeutic activities;Functional mobility training;Stair training;Gait training;Patient/family education;Manual techniques;Dry needling;Passive range of motion;Taping;Vasopneumatic Device;Spinal Manipulations;Joint Manipulations    PT Next Visit Plan Review HEP and progress PRN, further LE strength assessment, progress calf flexibility and strength, NMES for foot drop    PT Home Exercise Plan [id number]    Consulted and Agree with Plan of Care Patient             Patient will benefit from skilled therapeutic intervention in order to improve the following deficits and impairments:  Abnormal gait, Decreased range of motion, Difficulty walking, Pain, Decreased activity tolerance, Decreased  strength, Impaired sensation, Decreased balance, Impaired flexibility, Improper body mechanics  Visit Diagnosis: Pain in right ankle and joints of right foot  Muscle weakness (generalized)  Other abnormalities of gait and mobility     Problem List Patient Active Problem List   Diagnosis Date Noted   Hammer toes, bilateral 12/05/2019   Foot drop, bilateral 12/05/2019   Morbid obesity (HCC) 12/05/2019   Normocytic anemia 12/05/2019   Lumbar radiculopathy 12/04/2019   Planned postoperative wound closure    Osteomyelitis of foot, left, acute (HCC)    Ulcer of great toe, left, with necrosis of bone (HCC)     02/03/2020, PT, DPT, LAT, ATC 02/23/21  1:32 PM Phone: [phone] Fax: [phone]   [facility] [address] [place], [place], [place] Phone: [phone]   Fax:  [phone]  Name: Mark Stein MRN: [name] Date of Birth: 16-Feb-1986

## 2021-02-24 NOTE — Progress Notes (Signed)
  Subjective:  Patient ID: Mark Stein, male    DOB: Mar 03, 1986,  MRN: [id number]  Chief Complaint  Patient presents with   capsulitis    Follow up right foot capsulitis. Pt states pains has improved. Able to walk and drive a little better.    Diabetic Ulcer    Left great toe ulcer. Pt states no change. PT states ulcers started bleeding 2 days ago.    35 y.o. male presents for f/u.  History above confirmed patient Objective:  Physical Exam: Wound Location: left 1st Met Wound Base: Small pinpoint ulceration  peri-wound: Calloused Exudate: none wound without warmth, erythema, signs of acute infection  Right ankle - decreased muscle strength right. 4/5 Achilles strength right. Contralateral limb good strength. No POP achilles watershed are, no pain at the plantar fascia  Assessment:   1. Skin ulcer of left foot with fat layer exposed ([place])   2. Achilles tendinitis of right lower extremity      Plan:  Patient was evaluated and treated and all questions answered.  Ulcer left hallux -Debrided again, no deep exposure. Dressed with silvadene and bandage. Continue daily. Could consider   Right heel injury -Continues to improve with PT. Continue OOW restrictions until pain resolves.   No follow-ups on file.

## 2021-03-02 ENCOUNTER — Ambulatory Visit: Payer: BC Managed Care – PPO | Admitting: Physical Therapy

## 2021-03-02 ENCOUNTER — Other Ambulatory Visit: Payer: Self-pay

## 2021-03-02 ENCOUNTER — Encounter: Payer: Self-pay | Admitting: Physical Therapy

## 2021-03-02 DIAGNOSIS — M6281 Muscle weakness (generalized): Secondary | ICD-10-CM | POA: Diagnosis not present

## 2021-03-02 DIAGNOSIS — R2689 Other abnormalities of gait and mobility: Secondary | ICD-10-CM | POA: Diagnosis not present

## 2021-03-02 DIAGNOSIS — M25571 Pain in right ankle and joints of right foot: Secondary | ICD-10-CM | POA: Diagnosis not present

## 2021-03-02 NOTE — Therapy (Signed)
[facility] [address] [place], [place], [address] Phone: [phone]   Fax:  [phone]  Physical Therapy Treatment  Patient Details  Name: Mark Stein MRN: [id number] Date of Birth: August 11, 1985 Referring Provider (PT): Park Liter, DPM   Encounter Date: 03/02/2021   PT End of Session - 03/02/21 0916     Visit Number 4    Number of Visits 6    Date for PT Re-Evaluation 03/16/21    Authorization Type BCBS    PT Start Time 0910    PT Stop Time 0950    PT Time Calculation (min) 40 min    Activity Tolerance Patient tolerated treatment well    Behavior During Therapy [facility] for tasks assessed/performed             Past Medical History:  Diagnosis Date   Medical history non-contributory     Past Surgical History:  Procedure Laterality Date   EXCISION PARTIAL PHALANX Right 10/16/2018   Procedure: right great toe wound debridement and closure. arthroplasty packing antiobiotic beads. distal phlax chondrolectomy . bone biopsy.;  Surgeon: Park Liter, DPM;  Location: [facility];  Service: Podiatry;  Laterality: Right;   HAMMER TOE SURGERY Left 02/05/2019   Procedure: HAMMER TOE CORRECTION BIG TOE;  Surgeon: Park Liter, DPM;  Location: [facility];  Service: Podiatry;  Laterality: Left;   I & D EXTREMITY Left 11/07/2019   Procedure: IRRIGATION AND DEBRIDEMENT LEFT GREAT TOE WITH BONE BIOPSY, INSERTION OF ANTIBIOTIC BEADS;  Surgeon: Park Liter, DPM;  Location: [facility];  Service: Podiatry;  Laterality: Left;   MASS EXCISION Left 02/05/2019   Procedure: EXCISION BENIGN LESION 2.1-3.1CM;  Surgeon: Park Liter, DPM;  Location: [facility];  Service: Podiatry;  Laterality: Left;   NO PAST SURGERIES     TENDON TRANSFER Left 02/05/2019   Procedure: ADJACENT TISSUE TRANSER LEFT;  Surgeon: Park Liter, DPM;  Location: [facility];  Service: Podiatry;  Laterality: Left;   WOUND DEBRIDEMENT Left 11/21/2019   Procedure: LEFT  FOOT  DEBRIDEMENT WOUND WITH  CLOSURE AND REMOVAL REINSERTION OF NON BIODEGRADABLE DRUG DELIVERY IMPLANT;  Surgeon: Park Liter, DPM;  Location: [facility];  Service: Podiatry;  Laterality: Left;    There were no vitals filed for this visit.   Subjective Assessment - 03/02/21 0914     Subjective Patient reports he has a good day then a bad day. He feels a little better but still with difficulty walking. Patient states prior to the injury he didn't have any pain in his ankle and could walk around 10 minutes.    How long can you walk comfortably? 4 minutes    Patient Stated Goals Get pain relief and improve walking    Currently in Pain? Yes    Pain Score 4     Pain Location Ankle    Pain Orientation Right    Pain Descriptors / Indicators Burning    Pain Type Chronic pain    Pain Onset More than a month ago    Pain Frequency Intermittent    Aggravating Factors  Walking                [facility] PT Assessment - 03/02/21 0001       Strength   Right Ankle Plantar Flexion 2/5    Left Ankle Plantar Flexion 3-/5      Ambulation/Gait   Ambulation/Gait Yes    Ambulation/Gait Assistance 7: Independent    Ambulation  Distance (Feet) 580 Feet    Gait Pattern Right steppage;Left steppage    Gait Comments 4 minutes                    [facility] Adult PT Treatment/Exercise:  Therapeutic Exercise: Recumbent bike x 5 min while taking subjective Seated heel raise with 15# 3 x 10 each Longsitting ankle PF with blue 3 x 10 each Leg press (omega): 65# 2 x 10 Knee extension machine 20# 2 x 10  Manual Therapy: N/A  Neuromuscular re-ed: N/A  Therapeutic Activity: N/A  Modalities: N/A  Self Care: N/A                  PT Education - 03/02/21 0916     Education Details HEP, encouraged to progress reps and resistance at home to improve tolerance    Person(s) Educated Patient    Methods Explanation;Demonstration;Verbal cues    Comprehension Verbalized  understanding;Returned demonstration;Verbal cues required;Need further instruction              PT Short Term Goals - 02/02/21 1448       PT SHORT TERM GOAL #1   Title STG = LTG               PT Long Term Goals - 03/02/21 0920       PT LONG TERM GOAL #1   Title Patient will be I with advanced HEP to progress strength and walking ability    Baseline progressing    Time 6    Period Weeks    Status On-going      PT LONG TERM GOAL #2   Title Patient will demonstrate >/= 3/5 plantarflexion strength in order to improve walking ability    Baseline patient contiues to demonstrate gross calf weakness    Time 6    Period Weeks    Status On-going      PT LONG TERM GOAL #3   Title Patient will exhibit >/= 8 deg dorsiflexion PROM to improve lifting and squatting ability    Baseline 6 deg    Time 6    Period Weeks    Status On-going      PT LONG TERM GOAL #4   Title Patient will report ability to ambulate >/= 10 minutes with </= 3/10 pain in order to reduce functional limitation and return to work    Baseline 4 minutes before needing rest break    Time 6    Period Weeks    Status On-going                   Plan - 03/02/21 0919     Clinical Impression Statement Patient tolerated therapy well with no avderse effects. Therapy focused primarily on progressing calf strength and activity tolerance, and progressing walking and genral LE strengthening. Patient seems to be progressing with therapy but continues to demonstrate gross strength deficit that is limiting walking ability and functional mobility. Patient would benefit from continued skilled PT to progress ankle mobility and strength in order to reduce pain and improve walking ability.    PT Treatment/Interventions ADLs/Self Care Home Management;Aquatic Therapy;Cryotherapy;Electrical Stimulation;Biofeedback;Iontophoresis 4mg /ml Dexamethasone;Moist Heat;Traction;Ultrasound;Neuromuscular re-education;Balance  training;Therapeutic exercise;DME Instruction;Therapeutic activities;Functional mobility training;Stair training;Gait training;Patient/family education;Manual techniques;Dry needling;Passive range of motion;Taping;Vasopneumatic Device;Spinal Manipulations;Joint Manipulations    PT Next Visit Plan Review HEP and progress PRN, further LE strength assessment, progress calf flexibility and strength, NMES for foot drop    PT Home Exercise Plan [id number]  Consulted and Agree with Plan of Care Patient             Patient will benefit from skilled therapeutic intervention in order to improve the following deficits and impairments:  Abnormal gait, Decreased range of motion, Difficulty walking, Pain, Decreased activity tolerance, Decreased strength, Impaired sensation, Decreased balance, Impaired flexibility, Improper body mechanics  Visit Diagnosis: Pain in right ankle and joints of right foot  Muscle weakness (generalized)  Other abnormalities of gait and mobility     Problem List Patient Active Problem List   Diagnosis Date Noted   Hammer toes, bilateral 12/05/2019   Foot drop, bilateral 12/05/2019   Morbid obesity (HCC) 12/05/2019   Normocytic anemia 12/05/2019   Lumbar radiculopathy 12/04/2019   Planned postoperative wound closure    Osteomyelitis of foot, left, acute (HCC)    Ulcer of great toe, left, with necrosis of bone (HCC)     Rosana Hoes, PT, DPT, LAT, ATC 03/02/21  10:00 AM Phone: [phone] Fax: [phone]   [facility] Outpatient Rehabilitation [facility] [address] [place], [place], [address] Phone: [phone]   Fax:  [phone]  Name: Mark Stein MRN: [id number] Date of Birth: October 18, 1985

## 2021-03-03 DIAGNOSIS — M5116 Intervertebral disc disorders with radiculopathy, lumbar region: Secondary | ICD-10-CM | POA: Diagnosis not present

## 2021-03-03 DIAGNOSIS — M9905 Segmental and somatic dysfunction of pelvic region: Secondary | ICD-10-CM | POA: Diagnosis not present

## 2021-03-03 DIAGNOSIS — M9903 Segmental and somatic dysfunction of lumbar region: Secondary | ICD-10-CM | POA: Diagnosis not present

## 2021-03-03 DIAGNOSIS — M25552 Pain in left hip: Secondary | ICD-10-CM | POA: Diagnosis not present

## 2021-03-09 ENCOUNTER — Encounter: Payer: Self-pay | Admitting: Physical Therapy

## 2021-03-09 ENCOUNTER — Ambulatory Visit: Payer: BC Managed Care – PPO | Admitting: Physical Therapy

## 2021-03-09 ENCOUNTER — Other Ambulatory Visit: Payer: Self-pay

## 2021-03-09 DIAGNOSIS — M6281 Muscle weakness (generalized): Secondary | ICD-10-CM | POA: Diagnosis not present

## 2021-03-09 DIAGNOSIS — R2689 Other abnormalities of gait and mobility: Secondary | ICD-10-CM

## 2021-03-09 DIAGNOSIS — M25571 Pain in right ankle and joints of right foot: Secondary | ICD-10-CM | POA: Diagnosis not present

## 2021-03-09 NOTE — Therapy (Signed)
[facility] Outpatient Rehabilitation [facility] [address] [place], [place], [address] Phone: [phone]   Fax:  [phone]  Physical Therapy Treatment  Patient Details  Name: Mark Stein MRN: [id number] Date of Birth: [birth year] Referring Provider (PT): Park Liter, DPM   Encounter Date: 03/09/2021   PT End of Session - 03/09/21 1103     Visit Number 5    Number of Visits 6    Date for PT Re-Evaluation 03/16/21    Authorization Type BCBS    PT Start Time 1056    PT Stop Time 1130    PT Time Calculation (min) 34 min    Activity Tolerance Patient tolerated treatment well    Behavior During Therapy [facility] for tasks assessed/performed             Past Medical History:  Diagnosis Date   Medical history non-contributory     Past Surgical History:  Procedure Laterality Date   EXCISION PARTIAL PHALANX Right 10/16/2018   Procedure: right great toe wound debridement and closure. arthroplasty packing antiobiotic beads. distal phlax chondrolectomy . bone biopsy.;  Surgeon: Park Liter, DPM;  Location: [facility];  Service: Podiatry;  Laterality: Right;   HAMMER TOE SURGERY Left 02/05/2019   Procedure: HAMMER TOE CORRECTION BIG TOE;  Surgeon: Park Liter, DPM;  [facility];  Service: Podiatry;  Laterality: Left;   I & D EXTREMITY Left 11/07/2019   Procedure: IRRIGATION AND DEBRIDEMENT LEFT GREAT TOE WITH BONE BIOPSY, INSERTION OF ANTIBIOTIC BEADS;  Surgeon: Park Liter, DPM;  [facility];  Service: Podiatry;  Laterality: Left;   MASS EXCISION Left 02/05/2019   Procedure: EXCISION BENIGN LESION 2.1-3.1CM;  Surgeon: Park Liter, DPM;  [facility];  Service: Podiatry;  Laterality: Left;   NO PAST SURGERIES     TENDON TRANSFER Left 02/05/2019   Procedure: ADJACENT TISSUE TRANSER LEFT;  Surgeon: Park Liter, DPM;  [facility];  Service: Podiatry;  Laterality: Left;   WOUND DEBRIDEMENT Left 11/21/2019   Procedure: LEFT  FOOT  DEBRIDEMENT WOUND WITH  CLOSURE AND REMOVAL REINSERTION OF NON BIODEGRADABLE DRUG DELIVERY IMPLANT;  Surgeon: Park Liter, DPM;  [facility];  Service: Podiatry;  Laterality: Left;    There were no vitals filed for this visit.   Subjective Assessment - 03/09/21 1101     Subjective Patient reports that he had less pain since last visit due to his son being away so not being on his feet as much.    Patient Stated Goals Get pain relief and improve walking    Currently in Pain? Yes    Pain Score 5     Pain Location Ankle    Pain Orientation Right    Pain Descriptors / Indicators Burning    Pain Onset More than a month ago    Pain Frequency Intermittent    Aggravating Factors  Walking                [facility] PT Assessment - 03/09/21 0001       Strength   Right Ankle Plantar Flexion 2/5                     [facility] Adult PT Treatment/Exercise:   Therapeutic Exercise: Recumbent bike x 3 min while taking subjective Seated heel raise with 15# 2 x 15 each Longsitting ankle PF with blue 2 x 10 each Leg press (cybex): 80# 2 x 10 Heel raises on  leg press machine (cybex) 20# 2 x 30 sec - pulsing   Manual Therapy: N/A   Neuromuscular re-ed: N/A   Therapeutic Activity: N/A   Modalities: N/A   Self Care: N/A               PT Education - 03/09/21 1102     Education Details HEP    Person(s) Educated Patient    Methods Explanation;Demonstration;Verbal cues    Comprehension Verbalized understanding;Returned demonstration;Verbal cues required;Need further instruction              PT Short Term Goals - 02/02/21 1448       PT SHORT TERM GOAL #1   Title STG = LTG               PT Long Term Goals - 03/02/21 0920       PT LONG TERM GOAL #1   Title Patient will be I with advanced HEP to progress strength and walking ability    Baseline progressing    Time 6    Period Weeks    Status On-going      PT LONG TERM GOAL #2    Title Patient will demonstrate >/= 3/5 plantarflexion strength in order to improve walking ability    Baseline patient contiues to demonstrate gross calf weakness    Time 6    Period Weeks    Status On-going      PT LONG TERM GOAL #3   Title Patient will exhibit >/= 8 deg dorsiflexion PROM to improve lifting and squatting ability    Baseline 6 deg    Time 6    Period Weeks    Status On-going      PT LONG TERM GOAL #4   Title Patient will report ability to ambulate >/= 10 minutes with </= 3/10 pain in order to reduce functional limitation and return to work    Baseline 4 minutes before needing rest break    Time 6    Period Weeks    Status On-going                   Plan - 03/09/21 1112     Clinical Impression Statement Patient tolerated therapy well with no avderse effects. Therapy limited due to patient arriving late. Therapy continues to focus on strengthening for calves to reduce pain with walking. Patient continues to demonstrate gross calf strength worse on right, but does seem to be progressing with therapy and was able to perform heel raises in pulsing manner on leg press machine. Patient would benefit from continued skilled PT to progress ankle mobility and strength in order to reduce pain and improve walking ability.    PT Treatment/Interventions ADLs/Self Care Home Management;Aquatic Therapy;Cryotherapy;Electrical Stimulation;Biofeedback;Iontophoresis 4mg /ml Dexamethasone;Moist Heat;Traction;Ultrasound;Neuromuscular re-education;Balance training;Therapeutic exercise;DME Instruction;Therapeutic activities;Functional mobility training;Stair training;Gait training;Patient/family education;Manual techniques;Dry needling;Passive range of motion;Taping;Vasopneumatic Device;Spinal Manipulations;Joint Manipulations    PT Next Visit Plan Review HEP and progress PRN, further LE strength assessment, progress calf flexibility and strength, NMES for foot drop    PT Home Exercise Plan  [id number]    Consulted and Agree with Plan of Care Patient             Patient will benefit from skilled therapeutic intervention in order to improve the following deficits and impairments:  Abnormal gait, Decreased range of motion, Difficulty walking, Pain, Decreased activity tolerance, Decreased strength, Impaired sensation, Decreased balance, Impaired flexibility, Improper body mechanics  Visit Diagnosis: Pain in right ankle and joints  of right foot  Muscle weakness (generalized)  Other abnormalities of gait and mobility     Problem List Patient Active Problem List   Diagnosis Date Noted   Hammer toes, bilateral 12/05/2019   Foot drop, bilateral 12/05/2019   Morbid obesity (HCC) 12/05/2019   Normocytic anemia 12/05/2019   Lumbar radiculopathy 12/04/2019   Planned postoperative wound closure    Osteomyelitis of foot, left, acute (HCC)    Ulcer of great toe, left, with necrosis of bone (HCC)     Rosana Hoes, PT, DPT, LAT, ATC 03/09/21  11:32 AM Phone: [phone] Fax: [phone]   [facility] [address] [place], [place], [address] Phone: [phone]   Fax:  [phone]  Name: Mark Stein MRN: [id number] Date of Birth: 05/02/1985

## 2021-03-16 ENCOUNTER — Encounter: Payer: Self-pay | Admitting: Physical Therapy

## 2021-03-16 ENCOUNTER — Other Ambulatory Visit: Payer: Self-pay

## 2021-03-16 ENCOUNTER — Ambulatory Visit: Payer: BC Managed Care – PPO | Admitting: Physical Therapy

## 2021-03-16 DIAGNOSIS — M25571 Pain in right ankle and joints of right foot: Secondary | ICD-10-CM | POA: Diagnosis not present

## 2021-03-16 DIAGNOSIS — R2689 Other abnormalities of gait and mobility: Secondary | ICD-10-CM | POA: Diagnosis not present

## 2021-03-16 DIAGNOSIS — M6281 Muscle weakness (generalized): Secondary | ICD-10-CM

## 2021-03-16 NOTE — Therapy (Signed)
[facility] Outpatient Rehabilitation [facility] [address] [place], [place], [address] Phone: [phone]   Fax:  [phone]  Physical Therapy Treatment / ERO  Patient Details  Name: Mark Stein MRN: [id number] Date of Birth: [birth year] Referring Provider (PT): Park Liter, DPM   Encounter Date: 03/16/2021   PT End of Session - 03/16/21 0835     Visit Number 6    Number of Visits 12    Date for PT Re-Evaluation 04/27/21    Authorization Type BCBS    PT Start Time 0830    PT Stop Time 0915   5 min of FOTO assessment   PT Time Calculation (min) 45 min    Activity Tolerance Patient tolerated treatment well    Behavior During Therapy [facility] for tasks assessed/performed             Past Medical History:  Diagnosis Date   Medical history non-contributory     Past Surgical History:  Procedure Laterality Date   EXCISION PARTIAL PHALANX Right 10/16/2018   Procedure: right great toe wound debridement and closure. arthroplasty packing antiobiotic beads. distal phlax chondrolectomy . bone biopsy.;  Surgeon: Park Liter, DPM;  [facility];  Service: Podiatry;  Laterality: Right;   HAMMER TOE SURGERY Left 02/05/2019   Procedure: HAMMER TOE CORRECTION BIG TOE;  Surgeon: Park Liter, DPM;  [facility];  Service: Podiatry;  Laterality: Left;   I & D EXTREMITY Left 11/07/2019   Procedure: IRRIGATION AND DEBRIDEMENT LEFT GREAT TOE WITH BONE BIOPSY, INSERTION OF ANTIBIOTIC BEADS;  Surgeon: Park Liter, DPM;  [facility];  Service: Podiatry;  Laterality: Left;   MASS EXCISION Left 02/05/2019   Procedure: EXCISION BENIGN LESION 2.1-3.1CM;  Surgeon: Park Liter, DPM;  [facility];  Service: Podiatry;  Laterality: Left;   NO PAST SURGERIES     TENDON TRANSFER Left 02/05/2019   Procedure: ADJACENT TISSUE TRANSER LEFT;  Surgeon: Park Liter, DPM;  [facility];  Service: Podiatry;  Laterality: Left;   WOUND DEBRIDEMENT  Left 11/21/2019   Procedure: LEFT FOOT  DEBRIDEMENT WOUND WITH  CLOSURE AND REMOVAL REINSERTION OF NON BIODEGRADABLE DRUG DELIVERY IMPLANT;  Surgeon: Park Liter, DPM;  [facility];  Service: Podiatry;  Laterality: Left;    There were no vitals filed for this visit.   Subjective Assessment - 03/16/21 0833     Subjective Patient reports that he has been busy recently since his son has been home more often, so he has been on his feet more. States over the last couple of days he has felt it in the bottom of his more than usual, but he can walk or be on his feet longer periods before needing rest break.    How long can you walk comfortably? 6 minutes    Patient Stated Goals Get pain relief and improve walking    Currently in Pain? Yes    Pain Score 5     Pain Location Ankle    Pain Orientation Right    Pain Descriptors / Indicators Burning    Pain Type Chronic pain    Pain Onset More than a month ago    Pain Frequency Intermittent    Aggravating Factors  Walking                [facility] PT Assessment - 03/16/21 0001       Assessment   Medical Diagnosis Achilles tendinitis of right lower extremity  Referring Provider (PT) Park Liter, DPM      Precautions   Precautions Fall      Restrictions   Weight Bearing Restrictions No      Balance Screen   Has the patient fallen in the past 6 months Yes    How many times? No fall since beginning of therapy      Prior Function   Level of Independence Independent      Observation/Other Assessments   Focus on Therapeutic Outcomes (FOTO)  35% functional status      PROM   Right Ankle Dorsiflexion 8      Strength   Right Ankle Plantar Flexion 2/5                   [facility] Adult PT Treatment/Exercise:   Therapeutic Exercise: Recumbent bike L2 x 5 min while taking subjective Blood flow restriction Position and location of cuff: Seated and proximal thigh Limb occlusion pressure (mmHg): 250 Exercise  pressure (mmHg): 200 Exercise prescription: 62,83,15,17, reps with 30-60 sec rest Exercise comment: seated heel raise with 15#, longsitting PF with green Leg press (cybex): 80# 2 x 10 Heel raises on leg press machine (cybex) 20# 3 x 30 sec - pulsing   Manual Therapy: N/A   Neuromuscular re-ed: N/A   Therapeutic Activity: N/A   Modalities: N/A   Self Care: N/A                 PT Education - 03/16/21 0835     Education Details POC update, FOTO, HEP    Person(s) Educated Patient    Methods Explanation;Demonstration;Verbal cues    Comprehension Verbalized understanding;Returned demonstration;Verbal cues required;Need further instruction              PT Short Term Goals - 02/02/21 1448       PT SHORT TERM GOAL #1   Title STG = LTG               PT Long Term Goals - 03/16/21 0854       PT LONG TERM GOAL #1   Title Patient will be I with advanced HEP to progress strength and walking ability    Baseline progressing with HEP for strengthening    Time 6    Period Weeks    Status On-going    Target Date 04/27/21      PT LONG TERM GOAL #2   Title Patient will demonstrate >/= 3/5 plantarflexion strength in order to improve walking ability    Baseline patient contiues to demonstrate gross calf weakness    Time 6    Period Weeks    Status On-going    Target Date 04/27/21      PT LONG TERM GOAL #3   Title Patient will exhibit >/= 8 deg dorsiflexion PROM to improve lifting and squatting ability    Baseline 8 deg    Time 6    Period Weeks    Status Achieved      PT LONG TERM GOAL #4   Title Patient will report ability to ambulate >/= 10 minutes with </= 3/10 pain in order to reduce functional limitation and return to work    Baseline 6 minutes before needing rest break, continues to have > 3/10 pain    Time 6    Period Weeks    Status On-going    Target Date 04/27/21  Plan - 03/16/21 0839     Clinical  Impression Statement Patient tolerated therapy well with no adverse effects. He is gradually progressing toward his goals with continued strengthening, improved wallking tolerance, reduced pain, and improve ankle motion. Patient does continue to demonstrate significant calf weakness on the right and significant gait impairments that were likely present prior to the injury due to bilateral foot drop. Patient is continuing to progress with his strengthening and states improved walking tolerance so would benefit from continued PT at frequency of 1x/week for 6 weeks. Patient would benefit from continued skilled PT to progress ankle mobility and strength in order to reduce pain and improve walking ability.    PT Frequency 1x / week    PT Duration 6 weeks    PT Treatment/Interventions ADLs/Self Care Home Management;Aquatic Therapy;Cryotherapy;Electrical Stimulation;Biofeedback;Iontophoresis 4mg /ml Dexamethasone;Moist Heat;Traction;Ultrasound;Neuromuscular re-education;Balance training;Therapeutic exercise;DME Instruction;Therapeutic activities;Functional mobility training;Stair training;Gait training;Patient/family education;Manual techniques;Dry needling;Passive range of motion;Taping;Vasopneumatic Device;Spinal Manipulations;Joint Manipulations    PT Next Visit Plan Review HEP and progress PRN, further LE strength assessment, progress calf flexibility and strength, NMES for foot drop    PT Home Exercise Plan [id number]    Consulted and Agree with Plan of Care Patient             Patient will benefit from skilled therapeutic intervention in order to improve the following deficits and impairments:  Abnormal gait, Decreased range of motion, Difficulty walking, Pain, Decreased activity tolerance, Decreased strength, Impaired sensation, Decreased balance, Impaired flexibility, Improper body mechanics  Visit Diagnosis: Pain in right ankle and joints of right foot - Plan: PT plan of care cert/re-cert  Muscle  weakness (generalized) - Plan: PT plan of care cert/re-cert  Other abnormalities of gait and mobility - Plan: PT plan of care cert/re-cert     Problem List Patient Active Problem List   Diagnosis Date Noted   Hammer toes, bilateral 12/05/2019   Foot drop, bilateral 12/05/2019   Morbid obesity (HCC) 12/05/2019   Normocytic anemia 12/05/2019   Lumbar radiculopathy 12/04/2019   Planned postoperative wound closure    Osteomyelitis of foot, left, acute (HCC)    Ulcer of great toe, left, with necrosis of bone (HCC)     02/03/2020, PT, DPT, LAT, ATC 03/16/21  10:32 AM Phone: [phone] Fax: [phone]   [facility] Outpatient Rehabilitation [facility] [address] [place], [place], [place] Phone: [phone]   Fax:  [phone]  Name: Mark Stein MRN: [name] Date of Birth: December 13, 1985

## 2021-03-23 ENCOUNTER — Encounter: Payer: Self-pay | Admitting: Physical Therapy

## 2021-03-23 ENCOUNTER — Other Ambulatory Visit: Payer: Self-pay

## 2021-03-23 ENCOUNTER — Ambulatory Visit: Payer: BC Managed Care – PPO | Admitting: Physical Therapy

## 2021-03-23 DIAGNOSIS — M6281 Muscle weakness (generalized): Secondary | ICD-10-CM | POA: Diagnosis not present

## 2021-03-23 DIAGNOSIS — M25571 Pain in right ankle and joints of right foot: Secondary | ICD-10-CM

## 2021-03-23 DIAGNOSIS — R2689 Other abnormalities of gait and mobility: Secondary | ICD-10-CM | POA: Diagnosis not present

## 2021-03-23 NOTE — Therapy (Addendum)
[place], [place], [address] Phone: [phone]   Fax:  [phone]  Physical Therapy Treatment / Discharge  Patient Details  Name: Mark Stein MRN: [id number] Date of Birth: 17-Dec-1985 Referring Provider (PT): Evelina Bucy, DPM   Encounter Date: 03/23/2021   PT End of Session - 03/23/21 0937     Visit Number 7    Number of Visits 12    Date for PT Re-Evaluation 04/27/21    Authorization Type BCBS    PT Start Time [phone]   patient arrived late   PT Stop Time 1    PT Time Calculation (min) 39 min    Activity Tolerance Patient tolerated treatment well    Behavior During Therapy [facility] for tasks assessed/performed             Past Medical History:  Diagnosis Date   Medical history non-contributory     Past Surgical History:  Procedure Laterality Date   EXCISION PARTIAL PHALANX Right 10/16/2018   Procedure: right great toe wound debridement and closure. arthroplasty packing antiobiotic beads. distal phlax chondrolectomy . bone biopsy.;  Surgeon: Evelina Bucy, DPM;  [facility];  Service: Podiatry;  Laterality: Right;   HAMMER TOE SURGERY Left 02/05/2019   Procedure: HAMMER TOE CORRECTION BIG TOE;  Surgeon: Evelina Bucy, DPM;  [facility];  Service: Podiatry;  Laterality: Left;   I & D EXTREMITY Left 11/07/2019   Procedure: IRRIGATION AND DEBRIDEMENT LEFT GREAT TOE WITH BONE BIOPSY, INSERTION OF ANTIBIOTIC BEADS;  Surgeon: Evelina Bucy, DPM;  [facility];  Service: Podiatry;  Laterality: Left;   MASS EXCISION Left 02/05/2019   Procedure: EXCISION BENIGN LESION 2.1-3.1CM;  Surgeon: Evelina Bucy, DPM;  [facility];  Service: Podiatry;  Laterality: Left;   NO PAST SURGERIES     TENDON TRANSFER Left 02/05/2019   Procedure: ADJACENT TISSUE TRANSER LEFT;  Surgeon: Evelina Bucy, DPM;  [facility];  Service: Podiatry;  Laterality: Left;   WOUND DEBRIDEMENT  Left 11/21/2019   Procedure: LEFT FOOT  DEBRIDEMENT WOUND WITH  CLOSURE AND REMOVAL REINSERTION OF NON BIODEGRADABLE DRUG DELIVERY IMPLANT;  Surgeon: Evelina Bucy, DPM;  [facility];  Service: Podiatry;  Laterality: Left;    There were no vitals filed for this visit.   Subjective Assessment - 03/23/21 0931     Subjective Patient reports he is doing better. He states he was feeling better about moving around over the holidays, but then he went out with his son to throw the football and he lasted for about 5 minutes before needing to go inside and rest.    Patient Stated Goals Get pain relief and improve walking    Currently in Pain? Yes    Pain Score 4     Pain Location Ankle    Pain Orientation Right    Pain Descriptors / Indicators Burning    Pain Type Chronic pain    Pain Onset More than a month ago    Pain Frequency Intermittent    Aggravating Factors  Walking, standing, continuous activity    Pain Relieving Factors Rest                [facility] PT Assessment - 03/23/21 0001       Strength   Right Ankle Plantar Flexion 2/5                   [facility] Adult PT  Treatment/Exercise:   Therapeutic Exercise: Recumbent bike L2 x 5 min while taking subjective Heel raises on leg press machine (cybex) 20# 3 x 60 sec - pulsing Leg press (cybex): 90# 2 x 10 Seated heel raise with 20# 2 x 10 Longsitting banded PF with blue 2 x 10   Manual Therapy: N/A   Neuromuscular re-ed: N/A   Therapeutic Activity: N/A   Modalities: N/A   Self Care: N/A                 PT Education - 03/23/21 0937     Education Details Follow-up with doctor, HEP consistency and self-progression    Person(s) Educated Patient    Methods Explanation;Demonstration    Comprehension Verbalized understanding;Returned demonstration              PT Short Term Goals - 02/02/21 1448       PT SHORT TERM GOAL #1   Title STG = LTG               PT Long Term Goals  - 03/16/21 0854       PT LONG TERM GOAL #1   Title Patient will be I with advanced HEP to progress strength and walking ability    Baseline progressing with HEP for strengthening    Time 6    Period Weeks    Status On-going    Target Date 04/27/21      PT LONG TERM GOAL #2   Title Patient will demonstrate >/= 3/5 plantarflexion strength in order to improve walking ability    Baseline patient contiues to demonstrate gross calf weakness    Time 6    Period Weeks    Status On-going    Target Date 04/27/21      PT LONG TERM GOAL #3   Title Patient will exhibit >/= 8 deg dorsiflexion PROM to improve lifting and squatting ability    Baseline 8 deg    Time 6    Period Weeks    Status Achieved      PT LONG TERM GOAL #4   Title Patient will report ability to ambulate >/= 10 minutes with </= 3/10 pain in order to reduce functional limitation and return to work    Baseline 6 minutes before needing rest break, continues to have > 3/10 pain    Time 6    Period Weeks    Status On-going    Target Date 04/27/21                   Plan - 03/23/21 0940     Clinical Impression Statement Patient tolerated therapy well with no adverse effects. Therapy continues to focus on progressing calf strength, and improve ability to be on feet for longer periods of time. Patient continues to report limitations with tolerance for standing and walking, and significant deficits in calf strength and gait. This is the last scheduled PT visit and patient has appointment with his doctor so he was instructed to discuss continuing with therapy vs maintaining and progressing HEP independently. Reviewed and demonstrated HEP this visit and patient educated on self progression with band strength and weights. Overall, patient would benefit from continued skilled PT to progress ankle mobility and strength in order to reduce pain and improve walking ability.    PT Treatment/Interventions ADLs/Self Care Home  Management;Aquatic Therapy;Cryotherapy;Electrical Stimulation;Biofeedback;Iontophoresis 31m/ml Dexamethasone;Moist Heat;Traction;Ultrasound;Neuromuscular re-education;Balance training;Therapeutic exercise;DME Instruction;Therapeutic activities;Functional mobility training;Stair training;Gait training;Patient/family education;Manual techniques;Dry needling;Passive range of motion;Taping;Vasopneumatic Device;Spinal Manipulations;Joint Manipulations  PT Next Visit Plan Review HEP and progress PRN, further LE strength assessment, progress calf flexibility and strength, NMES for foot drop    PT Home Exercise Plan [id number]    Consulted and Agree with Plan of Care Patient             Patient will benefit from skilled therapeutic intervention in order to improve the following deficits and impairments:  Abnormal gait, Decreased range of motion, Difficulty walking, Pain, Decreased activity tolerance, Decreased strength, Impaired sensation, Decreased balance, Impaired flexibility, Improper body mechanics  Visit Diagnosis: Pain in right ankle and joints of right foot  Muscle weakness (generalized)  Other abnormalities of gait and mobility     Problem List Patient Active Problem List   Diagnosis Date Noted   Hammer toes, bilateral 12/05/2019   Foot drop, bilateral 12/05/2019   Morbid obesity ([place]) 12/05/2019   Normocytic anemia 12/05/2019   Lumbar radiculopathy 12/04/2019   Planned postoperative wound closure    Osteomyelitis of foot, left, acute (HCC)    Ulcer of great toe, left, with necrosis of bone ([place])     Hilda Blades, PT, DPT, LAT, ATC 03/23/21  11:05 AM Phone: [phone] Fax: [facility] [place], [place], [address] Phone: [phone]   Fax:  [phone]  Name: Mark Stein MRN: [id number] Date of Birth: [birth year]   PHYSICAL THERAPY DISCHARGE SUMMARY  Visits from Start of Care:  7  Current functional level related to goals / functional outcomes: See above   Remaining deficits: See above   Education / Equipment: HEP   Patient agrees to discharge. Patient goals were partially met. Patient is being discharged due to not returning since the last visit.

## 2021-03-25 ENCOUNTER — Ambulatory Visit (INDEPENDENT_AMBULATORY_CARE_PROVIDER_SITE_OTHER): Payer: BC Managed Care – PPO | Admitting: Podiatry

## 2021-03-25 ENCOUNTER — Other Ambulatory Visit: Payer: Self-pay

## 2021-03-25 DIAGNOSIS — M7661 Achilles tendinitis, right leg: Secondary | ICD-10-CM | POA: Diagnosis not present

## 2021-03-25 DIAGNOSIS — L97522 Non-pressure chronic ulcer of other part of left foot with fat layer exposed: Secondary | ICD-10-CM | POA: Diagnosis not present

## 2021-03-29 NOTE — Progress Notes (Signed)
  Subjective:  Patient ID: Mark Stein, male    DOB: [birth year],  MRN: [id number]  Chief Complaint  Patient presents with   Follow-up    20 Return in about 4 weeks   35 y.o. male presents for f/u.  History above confirmed patient.  States the left foot most like it is healing.  States it can be painful at times when walking and he has pain when pressure is applied to it.  States that the right ankle is doing better he is having improvements with PT able to walk for about 5 minutes before it is painful and tight. Objective:  Physical Exam: Wound Location: left 1st Met Wound Base: appears healed. peri-wound: Calloused Exudate: none wound without warmth, erythema, signs of acute infection  Right ankle - decreased muscle strength right. 4/5 Achilles strength right. Contralateral limb good strength. No POP achilles watershed area, no pain at the plantar fascia  Assessment:   1. Skin ulcer of left foot with fat layer exposed ([place])   2. Achilles tendinitis of right lower extremity       Plan:  Patient was evaluated and treated and all questions answered.  Ulcer left hallux -Not debrided today, appears closed. Could consider excision  Right heel injury -Continues to improve with PT. I think the worst of this is over but he is not able to do a lot of activity.  Will allow continued rest and exercises to build stamina.   Return in about 5 weeks (around 04/29/2021) for Right Achilles tendonitis f/u.

## 2021-04-29 ENCOUNTER — Other Ambulatory Visit: Payer: Self-pay

## 2021-04-29 ENCOUNTER — Ambulatory Visit (INDEPENDENT_AMBULATORY_CARE_PROVIDER_SITE_OTHER): Payer: BC Managed Care – PPO | Admitting: Podiatry

## 2021-04-29 DIAGNOSIS — M7661 Achilles tendinitis, right leg: Secondary | ICD-10-CM | POA: Diagnosis not present

## 2021-04-29 DIAGNOSIS — L97522 Non-pressure chronic ulcer of other part of left foot with fat layer exposed: Secondary | ICD-10-CM | POA: Diagnosis not present

## 2021-05-09 NOTE — Progress Notes (Signed)
Subjective:  Patient ID: Mark Stein, male    DOB: February 11, 1986,  MRN: [id number]  Chief Complaint  Patient presents with   Foot Ulcer    Follow up left foot   36 y.o. male presents for f/u.  History above confirmed patient.  Still having pain at the right ankle. The left foot has been draining and bleeding. Objective:  Physical Exam: Wound Location: left 1st Met Wound Base: 0.2x0.2 peri-wound: Calloused Exudate: none wound without warmth, erythema, signs of acute infection  Right ankle - decreased muscle strength right. 4/5 Achilles strength right. Contralateral limb good strength. No POP achilles watershed area, no pain at the plantar fascia  Assessment:   1. Skin ulcer of left foot with fat layer exposed ([place])   2. Achilles tendinitis of right lower extremity        Plan:  Patient was evaluated and treated and all questions answered.  Ulcer left hallux -Minimally debrided today, dressed with silvadene and DSD. Could consider excision and bone resection. Will discuss more next visit.  Right heel injury -Still improving, discussed strengthening to build up stamina.    Return in about 3 weeks (around 05/20/2021).

## 2021-05-24 ENCOUNTER — Ambulatory Visit (INDEPENDENT_AMBULATORY_CARE_PROVIDER_SITE_OTHER): Payer: BC Managed Care – PPO | Admitting: Podiatry

## 2021-05-24 ENCOUNTER — Encounter: Payer: Self-pay | Admitting: Podiatry

## 2021-05-24 ENCOUNTER — Other Ambulatory Visit: Payer: Self-pay

## 2021-05-24 DIAGNOSIS — M21372 Foot drop, left foot: Secondary | ICD-10-CM | POA: Diagnosis not present

## 2021-05-24 DIAGNOSIS — M21371 Foot drop, right foot: Secondary | ICD-10-CM | POA: Diagnosis not present

## 2021-05-24 DIAGNOSIS — M7661 Achilles tendinitis, right leg: Secondary | ICD-10-CM | POA: Diagnosis not present

## 2021-05-24 DIAGNOSIS — L97522 Non-pressure chronic ulcer of other part of left foot with fat layer exposed: Secondary | ICD-10-CM

## 2021-05-25 NOTE — Progress Notes (Signed)
Subjective:  Patient ID: Mark Stein, male    DOB: Aug 30, 1985,  MRN: [id number]  Chief Complaint  Patient presents with   Foot Pain    Follow up achilles tendonitis right   "Its still tight, but better"   Foot Ulcer    Follow up ulcer sub 1st MPJ left   "Its looking much better"   36 y.o. male presents for f/u.  History above confirmed patient.  The left great toe is not causing any issues.  Denies drainage.  States that the Achilles tendon area is doing better he is getting stronger and not having as much pain but it is still tight Objective:  Physical Exam: Wound Location: left 1st Met Wound Base: Epithelialized peri-wound: Calloused Exudate: none wound without warmth, erythema, signs of acute infection  Right ankle - decreased muscle strength right. 4/5 Achilles strength right. Contralateral limb good strength. No POP achilles watershed area, no pain at the plantar fascia  Assessment:   1. Achilles tendinitis of right lower extremity   2. Bilateral foot-drop   3. Skin ulcer of left foot with fat layer exposed ([place])    Plan:  Patient was evaluated and treated and all questions answered.  Ulcer left hallux -No ulceration noted today.  I think that he would benefit from custom orthotics will make an appointment for fabrication.  He has not wearing his  Right heel injury -Continues to improve slowly.  Discussed continue to work on increasing the distance he walks to improve stamina   Return in about 3 weeks (around 06/14/2021).

## 2021-06-21 ENCOUNTER — Ambulatory Visit: Payer: BC Managed Care – PPO | Admitting: Podiatry

## 2021-06-21 ENCOUNTER — Ambulatory Visit: Payer: Self-pay | Admitting: Podiatrist

## 2021-06-21 ENCOUNTER — Other Ambulatory Visit: Payer: Self-pay

## 2021-07-04 ENCOUNTER — Ambulatory Visit: Payer: Self-pay | Admitting: Podiatrist

## 2021-08-11 ENCOUNTER — Telehealth: Payer: Self-pay | Admitting: Podiatrist

## 2021-08-11 NOTE — Telephone Encounter (Signed)
Patient is still out of work on leave, patient was taken out of work by Dr. March Rummage 06/18/2020 and is still out of work. Please update work status and rate pain and limitations in office note, for Disability company. Thank you ?

## 2021-08-15 ENCOUNTER — Ambulatory Visit (INDEPENDENT_AMBULATORY_CARE_PROVIDER_SITE_OTHER): Payer: Self-pay | Admitting: Podiatrist

## 2021-08-15 ENCOUNTER — Encounter: Payer: Self-pay | Admitting: Podiatrist

## 2021-08-15 DIAGNOSIS — M7661 Achilles tendinitis, right leg: Secondary | ICD-10-CM

## 2021-08-15 DIAGNOSIS — L97522 Non-pressure chronic ulcer of other part of left foot with fat layer exposed: Secondary | ICD-10-CM

## 2021-08-15 NOTE — Patient Instructions (Signed)
Apply the santyl to the bottom of the left big toe ulcer daily and cover ?

## 2021-08-18 NOTE — Telephone Encounter (Signed)
If you would just addend the notes with work status and rate pain and limitations in office note, for [employer]. Because last time seen was 04/2021, so they will want to know why he's still out of work. Thank you ?

## 2021-08-20 MED ORDER — DICLOFENAC SODIUM 75 MG PO TBEC: 75.0000 mg | DELAYED_RELEASE_TABLET | Freq: Two times a day (BID) | ORAL | 1 refills | Status: AC

## 2021-08-20 MED ORDER — METHYLPREDNISOLONE 4 MG PO TBPK: ORAL_TABLET | ORAL | 1 refills | Status: AC

## 2021-08-20 NOTE — Progress Notes (Addendum)
?Chief Complaint  ?Patient presents with  ? Follow-up  ?  Patient states that the pain is still the same as his last visit.  ?  ? ?HPI: Patient is 36 y.o. male who presents today for follow up of achilles tendonitis.  He states he was doing PT for 2 months but had to stop due to insurance issues.  He has pain at 4/10 when sitting, 10/10 when walking.  He relates he has been improving some since being out of work and able to rest his feet.   ? ?Patient Active Problem List  ? Diagnosis Date Noted  ? Hammer toes, bilateral 12/05/2019  ? Foot drop, bilateral 12/05/2019  ? Morbid obesity ([place]) 12/05/2019  ? Normocytic anemia 12/05/2019  ? Lumbar radiculopathy 12/04/2019  ? Planned postoperative wound closure   ? Osteomyelitis of foot, left, acute ([place])   ? Ulcer of great toe, left, with necrosis of bone ([place])   ? ? ?Current Outpatient Medications on File Prior to Visit  ?Medication Sig Dispense Refill  ? acetaminophen (TYLENOL) 500 MG tablet Take 500 mg by mouth every 6 (six) hours as needed for moderate pain.     ? cefTRIAXone (ROCEPHIN) 1 g injection Inject into the vein.    ? metronidazole (FLAGYL) 375 MG capsule Take by mouth.    ? nicotine (NICODERM CQ - DOSED IN MG/24 HR) 7 mg/24hr patch Place 1 patch (7 mg total) onto the skin daily. 28 patch 0  ? oxyCODONE-acetaminophen (PERCOCET) 5-325 MG tablet Take 1 tablet by mouth every 4 (four) hours as needed for severe pain. 20 tablet 0  ? silver sulfADIAZINE (SILVADENE) 1 % cream Apply pea-sized amount to wound daily. 50 g 0  ? sodium chloride 0.9 % infusion Inject 10 mLs into the vein continuous. 100 mL 0  ? Soft Lens Products (RA SALINE SOLUTION) SOLN     ? ?No current facility-administered medications on file prior to visit.  ? ? ?Allergies  ?Allergen Reactions  ? Multihance [Gadobenate] Nausea Only  ?  Shortly after admin patient became very nauseated  ? ? ?Review of Systems ?No fevers, chills, nausea, muscle aches, no difficulty breathing, no calf pain, no chest  pain or shortness of breath. ? ? ?Physical Exam ? ?GENERAL APPEARANCE: Alert, conversant. Appropriately groomed. No acute distress.  ? ?VASCULAR: Pedal pulses palpable DP and PT bilateral.  Capillary refill time is immediate to all digits,  Proximal to distal cooling it warm to warm.  Digital perfusion adequate.  ? ?NEUROLOGIC: sensation is intact to 5.07 monofilament at 5/5 sites bilateral.  Light touch is intact bilateral, vibratory sensation intact bilateral. Negative tinnels sign bilateral.  ? ?MUSCULOSKELETAL: acceptable muscle strength, tone and stability bilateral.  Decreased range of motion at the ankle with knee extended and flexed bilateral. Some pain noted today at the insertion of the plantar fascia on the medial calcaneal tubercle bilateral. ? ?DERMATOLOGIC: skin is warm, supple, and dry.  Stable ulcer is present left first metatarsal. Wound base is epitheliazed and measures 0.4cm x 0.4cm x 0.2cm.  no redness, no swelling, no periwound edema present. No drainage noted.  No malodor, no probing to bone.  ? ?Assessment  ? ?  ICD-10-CM   ?1. Achilles tendinitis of right lower extremity  M76.61   ?  ?2. Skin ulcer of left foot with fat layer exposed ([place])  L97.522   ?  ? ? ? ? ?Plan ? ?Discussed treatment options and recommendations. I recommended an oral steroid followed by  an antiinflammatory as well as a boot for offloading.  I would also recommended he be off work for 4 more weeks with at home physical therapy if he is unable to resume formal physical therapy-  I am concerned that a return to work sooner than this would potentially worsen the tendonitis and would cause an exacerbation of the ulcer. The ulcer is almost healed, and I anticipate both conditions would be much improved in 4 weeks and at that time he should be able to return to work.  I will check him again in 3 weeks ?

## 2021-09-05 ENCOUNTER — Encounter: Payer: Self-pay | Admitting: Podiatrist

## 2021-09-05 ENCOUNTER — Ambulatory Visit (INDEPENDENT_AMBULATORY_CARE_PROVIDER_SITE_OTHER): Payer: Self-pay | Admitting: Podiatrist

## 2021-09-05 DIAGNOSIS — M21372 Foot drop, left foot: Secondary | ICD-10-CM

## 2021-09-05 DIAGNOSIS — M7661 Achilles tendinitis, right leg: Secondary | ICD-10-CM

## 2021-09-05 DIAGNOSIS — L97522 Non-pressure chronic ulcer of other part of left foot with fat layer exposed: Secondary | ICD-10-CM

## 2021-09-05 DIAGNOSIS — M21371 Foot drop, right foot: Secondary | ICD-10-CM

## 2021-09-05 NOTE — Progress Notes (Signed)
Chief Complaint  Patient presents with   Foot Pain     HPI: Patient is 36 y.o. male who presents today for follow up of achilles tendon pain/ partial tear as well as for the ulcer submetatarsal 1 of the left foot.  He has not been doing physical therapy. 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  He relates he took the steroid dose pack and the foot/ and achilles tendon felt better while on the medication but now the foot hurts again.     Allergies  Allergen Reactions   Multihance [Gadobenate] Nausea Only    Shortly after admin patient became very nauseated    Review of systems is negative except as noted in the HPI.  Denies nausea/ vomiting/ fevers/ chills or night sweats.   Denies difficulty breathing, denies calf pain or tenderness  Physical Exam  Patient is awake, alert, and oriented x 3.  In no acute distress.    Vascular status is intact with palpable pedal pulses DP and PT bilateral and capillary refill time less than 3 seconds bilateral.  No edema or erythema noted.   Neurological exam reveals epicritic and protective sensation grossly intact bilateral.   Physical Exam: Wound Location: left 1st Met Wound Base: Epithelialized peri-wound: Calloused Exudate: none wound without warmth, no erythema, no signs of acute infection   Right ankle - decreased muscle strength right. 4/5 Achilles strength right. Contralateral limb good strength. No POP achilles watershed area, some swelling posterior ankle.  Dropfoot noted bilateral.     Assessment:   ICD-10-CM   1. Achilles tendinitis of right lower extremity  M76.61     2. Skin ulcer of left foot with fat layer exposed ([place])  L97.522     3. Bilateral foot-drop  M21.371    M21.372         Plan: Pared the hyperkeratotic tissue away from the superficial ulceration  submetatarsal one of the left foot without complication.  Intact integument was noted post debridement. I dispensed a dropfoot brace for the left which was a donation from a previous patient to see how he fares with this.  At today's visit he states that it was comfortable. He does still have some swelling and discomfort on the Achilles tendon on the right therefore I recommended 4 more weeks of outpatient physical therapy.  I will extend his time off of work for 6 more weeks as he is improving but at a very slow rate likely due to his bilateral dropfoot which is causing complications with his recovery.  He will be seen back in 6 weeks for follow-up I also instructed him to call if the dropfoot brace on the left is working and we can order him one for the right to assist with ambulation and stability.

## 2021-10-04 ENCOUNTER — Ambulatory Visit (INDEPENDENT_AMBULATORY_CARE_PROVIDER_SITE_OTHER): Payer: Self-pay | Admitting: Podiatry

## 2021-10-04 ENCOUNTER — Encounter: Payer: Self-pay | Admitting: Podiatry

## 2021-10-04 ENCOUNTER — Ambulatory Visit (INDEPENDENT_AMBULATORY_CARE_PROVIDER_SITE_OTHER): Payer: Self-pay

## 2021-10-04 DIAGNOSIS — M7661 Achilles tendinitis, right leg: Secondary | ICD-10-CM

## 2021-10-04 DIAGNOSIS — L97522 Non-pressure chronic ulcer of other part of left foot with fat layer exposed: Secondary | ICD-10-CM

## 2021-10-04 DIAGNOSIS — M21371 Foot drop, right foot: Secondary | ICD-10-CM

## 2021-10-04 DIAGNOSIS — M5417 Radiculopathy, lumbosacral region: Secondary | ICD-10-CM

## 2021-10-04 DIAGNOSIS — M21372 Foot drop, left foot: Secondary | ICD-10-CM

## 2021-10-07 NOTE — Progress Notes (Signed)
  Subjective:  Patient ID: Mark Stein, male    DOB: [birth year],  MRN: [id number]  Chief Complaint  Patient presents with   Tendonitis        recheck ulcer and achilles tendonitis.    36 y.o. male presents with the above complaint. History confirmed with patient.  He has had multiple issues including right Achilles tendinitis bilateral foot drop and ulcerations on the left foot.  He previous has undergone management of the foot drop and ulcerations with Dr. March Rummage.  He had previously severe infections requiring surgical intervention and grafting.  These have been improved but the ulceration has returned somewhat.  Feels well denies fever chills nausea vomiting.  He previously underwent neurologic testing for the foot drop and was recommended to see a neurosurgeon which he did but they wanted to avoid surgery with the possibility of infection in the foot at the time  Objective:  Physical Exam: warm, good capillary refill, no trophic changes or ulcerative lesions, normal DP and PT pulses, normal sensory exam, and there is superficial ulceration with exposed subcutaneous tissue measuring 0.6 x 0.3 x 0.2 cm, no purulence drainage erythema cellulitis exposed bone tendon or joint or signs of infection.  Hyperkeratosis surrounding this.  He has 3+ out of 5 strength in the bilateral anterior compartments.  Some tenderness in the mid substance of the Achilles tendon on the right    Radiographs: Multiple views x-ray of the left foot: no soft tissue emphysema, no signs of osteomyelitis, previous bone resection in the left hallux noted Assessment:   1. Skin ulcer of left foot with fat layer exposed ([place])   2. Bilateral foot-drop   3. Achilles tendinitis of right lower extremity   4. Lumbosacral radiculopathy      Plan:  Patient was evaluated and treated and all questions answered.  For his Achilles tendinitis I recommend he continue his home therapy exercise plan and use Voltaren gel as needed  which she is doing.  His most concerning issue is the bilateral foot drop.  He has evidence of previous lumbar radiculopathy causing this, surgical intervention was previously not possible due to the possibility of infection.  I do not see any evidence of infection clinically or radiographically today.  I have also ordered lab work including CBC CRP and ESR to evaluate for inflammatory markers.  At his age bilateral foot drop bracing likely would not be a long-term solution.  I am hopeful that he will be able to get intervention with neurosurgery and we sent a new referral for this to be reevaluated.  The ulceration was debrided to subcutaneous tissue sharply using a scalpel today.  I will see him back in few weeks for ongoing wound care.  Return in about 3 weeks (around 10/25/2021) for wound care.

## 2021-11-01 ENCOUNTER — Encounter: Payer: Self-pay | Admitting: Podiatry

## 2021-11-01 ENCOUNTER — Telehealth: Payer: Self-pay | Admitting: Podiatry

## 2021-11-01 ENCOUNTER — Ambulatory Visit (INDEPENDENT_AMBULATORY_CARE_PROVIDER_SITE_OTHER): Payer: Self-pay | Admitting: Podiatry

## 2021-11-01 DIAGNOSIS — L97522 Non-pressure chronic ulcer of other part of left foot with fat layer exposed: Secondary | ICD-10-CM

## 2021-11-01 NOTE — Progress Notes (Signed)
  Subjective:  Patient ID: Mark Stein, male    DOB: 28-Jan-1986,  MRN: [id number]  Chief Complaint  Patient presents with   Wound Check    Left foot wound check  Right foot achilles tendinitis pt stated that he is having a little bit of pain    36 y.o. male presents with the above complaint. History confirmed with patient.  He has had multiple issues including right Achilles tendinitis bilateral foot drop and ulcerations on the left foot.  He previous has undergone management of the foot drop and ulcerations with Dr. Samuella Cota.  He had previously severe infections requiring surgical intervention and grafting.  These have been improved but the ulceration has returned somewhat.  Feels well denies fever chills nausea vomiting.  He previously underwent neurologic testing for the foot drop and was recommended to see a neurosurgeon which he did but they wanted to avoid surgery with the possibility of infection in the foot at the time  Interval history: He says he had to miss his spine appointment due to a family issue.  The wound has slowed down from drainage seems like it is closing over.  The right Achilles is doing okay.  He is back to work half time, has not noticed it worsening since starting back to work  Objective:  Physical Exam: warm, good capillary refill, no trophic changes or ulcerative lesions, normal DP and PT pulses, normal sensory exam, and there is superficial ulceration with exposed subcutaneous tissue measuring 0 1.8 x 0.4 x 0.4 cm, no purulence drainage erythema cellulitis exposed bone tendon or joint or signs of infection.  Today there is significant hyperkeratosis surrounding this.  He has 3+ out of 5 strength in the bilateral anterior compartments.  Some tenderness in the mid substance of the Achilles tendon on the right     Radiographs: Multiple views x-ray of the left foot: no soft tissue emphysema, no signs of osteomyelitis, previous bone resection in the left hallux  noted Assessment:   1. Skin ulcer of left foot with fat layer exposed ([facility])      Plan:  Patient was evaluated and treated and all questions answered.  For his Achilles tendinitis I recommend he continue his home therapy exercise plan and use Voltaren gel as needed which he is doing.  I strongly recommended he reschedule his neurosurgery appointment to address his spinal pathology and dropfoot   Ulcer left foot -We discussed the etiology and factors that are a part of the wound healing process.  We also discussed the risk of infection both soft tissue and osteomyelitis from open ulceration.  Discussed the risk of limb loss if this happens or worsens. -Debridement as below. -Dressed with Iodosorb, DSD. -Continue home dressing changes daily with 4 x 4 gauze and Silvadene  Procedure: Excisional Debridement of Wound Rationale: Removal of non-viable soft tissue from the wound to promote healing.  Anesthesia: none Post-Debridement Wound Measurements: 1.8 cm x 0.4 cm x 0.4 cm  Type of Debridement: Sharp Excisional Tissue Removed: Non-viable soft tissue Depth of Debridement: subcutaneous tissue. Technique: Sharp excisional debridement to bleeding, viable wound base.  Dressing: Dry, sterile, compression dressing. Disposition: Patient tolerated procedure well.       Return in about 3 weeks (around 11/22/2021) for wound care.

## 2021-11-01 NOTE — Telephone Encounter (Signed)
Patient said he is able to return to work full duty, no restrictions on 11/02/2021. Per. Dr. Lilian Kapur it is ok for patient to return to work .

## 2021-11-22 ENCOUNTER — Ambulatory Visit (INDEPENDENT_AMBULATORY_CARE_PROVIDER_SITE_OTHER): Payer: Self-pay | Admitting: Podiatry

## 2021-11-22 DIAGNOSIS — M7661 Achilles tendinitis, right leg: Secondary | ICD-10-CM

## 2021-11-22 DIAGNOSIS — M21372 Foot drop, left foot: Secondary | ICD-10-CM

## 2021-11-22 DIAGNOSIS — L97522 Non-pressure chronic ulcer of other part of left foot with fat layer exposed: Secondary | ICD-10-CM

## 2021-11-22 DIAGNOSIS — M21371 Foot drop, right foot: Secondary | ICD-10-CM

## 2021-11-23 NOTE — Progress Notes (Signed)
  Subjective:  Patient ID: Mark Stein, male    DOB: June 22, 1985,  MRN: [id number]  Chief Complaint  Patient presents with   Foot Ulcer    Left foot 3 week follow up - doing better - no drainage. He is back at work full time now. His legs are a little sore, but overall he is doing great    36 y.o. male presents with the above complaint. History confirmed with patient.  He has had multiple issues including right Achilles tendinitis bilateral foot drop and ulcerations on the left foot.  He previous has undergone management of the foot drop and ulcerations with Dr. Samuella Cota.  He had previously severe infections requiring surgical intervention and grafting.  These have been improved but the ulceration has returned somewhat.  Feels well denies fever chills nausea vomiting.  He previously underwent neurologic testing for the foot drop and was recommended to see a neurosurgeon which he did but they wanted to avoid surgery with the possibility of infection in the foot at the time  Interval history: Doing better no drainage, he has neurosurgery follow-up scheduled for September, the Achilles is feeling better  Objective:  Physical Exam: warm, good capillary refill, no trophic changes or ulcerative lesions, normal DP and PT pulses, normal sensory exam, and the previous wound is healed with significant hyperkeratosis significant hyperkeratosis surrounding this.  He has 3+ out of 5 strength in the bilateral anterior compartments.  Some tenderness in the mid substance of the Achilles tendon on the right     Radiographs: Multiple views x-ray of the left foot: no soft tissue emphysema, no signs of osteomyelitis, previous bone resection in the left hallux noted Assessment:   1. Skin ulcer of left foot with fat layer exposed ([facility])   2. Bilateral foot-drop   3. Achilles tendinitis of right lower extremity       Plan:  Patient was evaluated and treated and all questions answered.  Continue home  therapy for his Achilles tendinitis  He has neurosurgery follow-up scheduled   Ulcer left foot -Ulcer has healed.  He will continue to monitor closely.  Can leave OTA and apply moisturizing lotion or Aquaphor or Vaseline for now.  I will see him back in 4 weeks for reevaluation to ensure that has not reopened      Return in about 4 weeks (around 12/20/2021) for wound care.

## 2021-12-29 ENCOUNTER — Ambulatory Visit: Payer: Self-pay | Admitting: Podiatry

## 2023-10-02 ENCOUNTER — Emergency Department (HOSPITAL_COMMUNITY)
Admission: EM | Admit: 2023-10-02 | Discharge: 2023-10-02 | Disposition: A | Discharge status: Home / Self Care | Attending: Emergency Medicine | Admitting: Emergency Medicine

## 2023-10-02 ENCOUNTER — Other Ambulatory Visit: Payer: Self-pay

## 2023-10-02 ENCOUNTER — Encounter (HOSPITAL_COMMUNITY): Payer: Self-pay

## 2023-10-02 DIAGNOSIS — M545 Low back pain, unspecified: Secondary | ICD-10-CM | POA: Diagnosis present

## 2023-10-02 LAB — URINALYSIS, ROUTINE W REFLEX MICROSCOPIC
Bacteria, UA: NONE SEEN
Glucose, UA: NEGATIVE mg/dL
Hgb urine dipstick: NEGATIVE
Ketones, ur: 5 mg/dL — AB
Leukocytes,Ua: NEGATIVE
Nitrite: NEGATIVE
Protein, ur: 30 mg/dL — AB
Specific Gravity, Urine: 1.04 — ABNORMAL HIGH (ref 1.005–1.030)
pH: 5 (ref 5.0–8.0)

## 2023-10-02 MED ORDER — CYCLOBENZAPRINE HCL 10 MG PO TABS: 10.0000 mg | ORAL_TABLET | Freq: Two times a day (BID) | ORAL | 0 refills | Status: AC | PRN

## 2023-10-02 MED ORDER — ACETAMINOPHEN 500 MG PO TABS: 500.0000 mg | ORAL_TABLET | Freq: Four times a day (QID) | ORAL | 0 refills | Status: AC | PRN

## 2023-10-02 MED ORDER — CELECOXIB 200 MG PO CAPS: 200.0000 mg | ORAL_CAPSULE | Freq: Two times a day (BID) | ORAL | 0 refills | Status: AC

## 2023-10-02 MED ORDER — OXYCODONE HCL 5 MG PO TABS
10.0000 mg | ORAL_TABLET | Freq: Once | ORAL | Status: AC
  Administered 2023-10-02: 10 mg via ORAL
  Filled 2023-10-02: qty 2

## 2023-10-02 NOTE — ED Triage Notes (Signed)
 Pt reports low back pain since Sunday. No relief with OTC meds. No injury reported.

## 2023-10-02 NOTE — ED Provider Notes (Signed)
 [facility] Provider Note   CSN: [id number] Arrival date & time: 10/02/23  0005     History Chief Complaint  Patient presents with   Back Pain    HPI Mark Stein is a 38 y.o. male presenting for chief complaint of back pain. States that he woke up Sunday with the pain.  Denies fevers chills nausea vomitting syncope SOB  Patient's recorded medical, surgical, social, medication list and allergies were reviewed in the Snapshot window as part of the initial history.   Review of Systems   Review of Systems  Constitutional:  Negative for chills and fever.  HENT:  Negative for ear pain and sore throat.   Eyes:  Negative for pain and visual disturbance.  Respiratory:  Negative for cough and shortness of breath.   Cardiovascular:  Negative for chest pain and palpitations.  Gastrointestinal:  Negative for abdominal pain and vomiting.  Genitourinary:  Negative for dysuria and hematuria.  Musculoskeletal:  Positive for back pain. Negative for arthralgias.  Skin:  Negative for color change and rash.  Neurological:  Negative for seizures and syncope.  All other systems reviewed and are negative.   Physical Exam Updated Vital Signs BP (!) 140/89 (BP Location: Right Arm)   Pulse 75   Temp 98.2 F (36.8 C) (Oral)   Resp 16   Ht 6' (1.829 m)   Wt 133.4 kg   SpO2 100%   BMI 39.89 kg/m  Physical Exam Vitals and nursing note reviewed.  Constitutional:      General: He is not in acute distress.    Appearance: He is well-developed.  HENT:     Head: Normocephalic and atraumatic.  Eyes:     Conjunctiva/sclera: Conjunctivae normal.  Cardiovascular:     Rate and Rhythm: Normal rate and regular rhythm.     Heart sounds: No murmur heard. Pulmonary:     Effort: Pulmonary effort is normal. No respiratory distress.     Breath sounds: Normal breath sounds.  Abdominal:     Palpations: Abdomen is soft.     Tenderness: There is no  abdominal tenderness.  Musculoskeletal:        General: No swelling.     Cervical back: Neck supple.  Skin:    General: Skin is warm and dry.     Capillary Refill: Capillary refill takes less than 2 seconds.  Neurological:     Mental Status: He is alert.  Psychiatric:        Mood and Affect: Mood normal.      ED Course/ Medical Decision Making/ A&P    Procedures Procedures   Medications Ordered in ED Medications  oxyCODONE  (Oxy IR/ROXICODONE ) immediate release tablet 10 mg (10 mg Oral Given 10/02/23 0458)   Medical Decision Making:   Mark Stein is a 38 y.o. male who presented to the ED today with acute lower back pain over the past 48 hours, detailed above.    Patient placed on continuous vitals and telemetry monitoring while in ED which was reviewed periodically.   On my initial exam, the pt was with an intact neurologic exam, tolerating ambulation with an antalgic gait and p.o. intake without difficulty.  Patient had no abnormal DTRs, no midline spinal tenderness.  Patient endorsing complete sensation of the perineum.  Patient without episodes of fecal or urinary incontinence.  Patient has no focal neurologic deficits and reassuring vital signs at this time.  No obvious physical abnormality or injury on  exam. Notably, patient denies recent trauma, is afebrile, and denies IVDU.   Reviewed and confirmed nursing documentation for past medical history, family history, social history.    Initial Assessment:   With the patient's presentation of acute back pain in the above setting, most likely diagnosis is musculoskeletal strain. Other diagnoses were considered including (but not limited to) underlying fracture, epidural hematoma, cauda equina syndrome, spinal stenosis, spinal malignancy. These are considered less likely due to history of present illness and physical exam findings.   In particular, lack of fever, substantial history of IV drug use, or substantial neurologic  abnormality is less consistent with epidural abscess versus discitis or other spinal infection.   Initial Plan:  Multimodal pain control described and patient informed on safe usage.  Screening evaluation including below radiographic evaluation reviewed and grossly unremarkable at this time. Patient stable for continued outpatient evaluation and management of their musculoskeletal pains.  Patient referred back to primary care provider for continued evaluation and management.   Initial Study Results:   Radiology  No orders to display     Disposition:   Based on the above findings, I believe patient is stable for discharge.    Patient and family educated about specific return precautions for given chief complaint and symptoms.  Patient and family educated about follow-up with PCP.  Patient and family expressed understanding of return precautions and need for follow-up. Patient spoken to regarding all imaging and laboratory results and appropriate follow up for these results. All education provided in verbal and written form and time was allowed for answering of patient questions. Patient discharged.          Emergency Department Medication Summary:   Medications  oxyCODONE  (Oxy IR/ROXICODONE ) immediate release tablet 10 mg (10 mg Oral Given 10/02/23 0458)     Clinical Impression:  1. Acute low back pain without sciatica, unspecified back pain laterality      Discharge   Final Clinical Impression(s) / ED Diagnoses Final diagnoses:  Acute low back pain without sciatica, unspecified back pain laterality    Rx / DC Orders ED Discharge Orders          Ordered    celecoxib (CELEBREX) 200 MG capsule  2 times daily        10/02/23 0612    cyclobenzaprine (FLEXERIL) 10 MG tablet  2 times daily PRN        10/02/23 0612    acetaminophen  (TYLENOL ) 500 MG tablet  Every 6 hours PRN        10/02/23 1610              Onetha Bile, MD 10/02/23 [phone]
